# Patient Record
Sex: Female | Born: 1960 | Race: White | Hispanic: No | Smoking: Current some day smoker
Health system: Southern US, Community
[De-identification: ages and names within clinical notes are randomized; demographics above are authoritative.]

## PROBLEM LIST (undated history)

## (undated) DIAGNOSIS — D649 Anemia, unspecified: Secondary | ICD-10-CM

## (undated) DIAGNOSIS — IMO0002 Reserved for concepts with insufficient information to code with codable children: Secondary | ICD-10-CM

## (undated) DIAGNOSIS — K219 Gastro-esophageal reflux disease without esophagitis: Secondary | ICD-10-CM

## (undated) DIAGNOSIS — M1990 Unspecified osteoarthritis, unspecified site: Secondary | ICD-10-CM

## (undated) DIAGNOSIS — F419 Anxiety disorder, unspecified: Secondary | ICD-10-CM

## (undated) DIAGNOSIS — S2341XA Sprain of ribs, initial encounter: Secondary | ICD-10-CM

## (undated) DIAGNOSIS — M549 Dorsalgia, unspecified: Secondary | ICD-10-CM

## (undated) DIAGNOSIS — J45909 Unspecified asthma, uncomplicated: Secondary | ICD-10-CM

## (undated) DIAGNOSIS — E119 Type 2 diabetes mellitus without complications: Secondary | ICD-10-CM

## (undated) DIAGNOSIS — F32A Depression, unspecified: Secondary | ICD-10-CM

## (undated) DIAGNOSIS — R945 Abnormal results of liver function studies: Secondary | ICD-10-CM

## (undated) DIAGNOSIS — K8020 Calculus of gallbladder without cholecystitis without obstruction: Secondary | ICD-10-CM

## (undated) DIAGNOSIS — R7989 Other specified abnormal findings of blood chemistry: Secondary | ICD-10-CM

## (undated) DIAGNOSIS — F329 Major depressive disorder, single episode, unspecified: Secondary | ICD-10-CM

## (undated) DIAGNOSIS — J189 Pneumonia, unspecified organism: Secondary | ICD-10-CM

## (undated) HISTORY — DX: Reserved for concepts with insufficient information to code with codable children: IMO0002

## (undated) HISTORY — DX: Depression, unspecified: F32.A

## (undated) HISTORY — DX: Calculus of gallbladder without cholecystitis without obstruction: K80.20

## (undated) HISTORY — DX: Sprain of ribs, initial encounter: S23.41XA

## (undated) HISTORY — PX: TUBAL LIGATION: SHX77

## (undated) HISTORY — PX: BREAST EXCISIONAL BIOPSY: SUR124

## (undated) HISTORY — DX: Pneumonia, unspecified organism: J18.9

## (undated) HISTORY — DX: Dorsalgia, unspecified: M54.9

## (undated) HISTORY — DX: Major depressive disorder, single episode, unspecified: F32.9

## (undated) HISTORY — DX: Type 2 diabetes mellitus without complications: E11.9

## (undated) HISTORY — DX: Unspecified osteoarthritis, unspecified site: M19.90

## (undated) HISTORY — DX: Unspecified asthma, uncomplicated: J45.909

## (undated) HISTORY — DX: Anxiety disorder, unspecified: F41.9

---

## 2000-05-23 HISTORY — PX: BREAST CYST ASPIRATION: SHX578

## 2004-06-11 ENCOUNTER — Emergency Department: Payer: Self-pay | Admitting: Unknown Physician Specialty

## 2005-05-09 ENCOUNTER — Emergency Department: Payer: Self-pay | Admitting: General Practice

## 2007-04-24 ENCOUNTER — Emergency Department: Payer: Self-pay | Admitting: Emergency Medicine

## 2007-04-28 ENCOUNTER — Emergency Department: Payer: Self-pay | Admitting: Emergency Medicine

## 2008-06-05 ENCOUNTER — Emergency Department: Payer: Self-pay | Admitting: Emergency Medicine

## 2008-11-19 ENCOUNTER — Emergency Department: Payer: Self-pay | Admitting: Internal Medicine

## 2009-06-17 ENCOUNTER — Emergency Department: Payer: Self-pay | Admitting: Emergency Medicine

## 2010-12-23 ENCOUNTER — Emergency Department: Payer: Self-pay | Admitting: Emergency Medicine

## 2011-06-26 ENCOUNTER — Ambulatory Visit: Payer: Self-pay | Admitting: Specialist

## 2011-12-06 ENCOUNTER — Ambulatory Visit: Payer: Self-pay | Admitting: Family Medicine

## 2011-12-08 ENCOUNTER — Ambulatory Visit: Payer: Self-pay | Admitting: Pain Medicine

## 2011-12-19 ENCOUNTER — Ambulatory Visit: Payer: Self-pay | Admitting: Pain Medicine

## 2012-01-02 ENCOUNTER — Ambulatory Visit: Payer: Self-pay | Admitting: Pain Medicine

## 2012-01-19 ENCOUNTER — Ambulatory Visit: Payer: Self-pay | Admitting: Pain Medicine

## 2012-02-01 ENCOUNTER — Ambulatory Visit: Payer: Self-pay | Admitting: Pain Medicine

## 2012-02-28 ENCOUNTER — Ambulatory Visit: Payer: Self-pay | Admitting: Pain Medicine

## 2012-03-12 ENCOUNTER — Ambulatory Visit: Payer: Self-pay | Admitting: Pain Medicine

## 2012-04-10 ENCOUNTER — Ambulatory Visit: Payer: Self-pay | Admitting: Pain Medicine

## 2012-04-23 ENCOUNTER — Ambulatory Visit: Payer: Self-pay | Admitting: Pain Medicine

## 2012-05-14 ENCOUNTER — Ambulatory Visit: Payer: Self-pay | Admitting: Pain Medicine

## 2012-05-23 HISTORY — PX: BREAST CYST ASPIRATION: SHX578

## 2012-06-12 ENCOUNTER — Ambulatory Visit: Payer: Self-pay | Admitting: Pain Medicine

## 2012-06-25 ENCOUNTER — Ambulatory Visit: Payer: Self-pay | Admitting: Pain Medicine

## 2012-07-19 ENCOUNTER — Ambulatory Visit: Payer: Self-pay | Admitting: Pain Medicine

## 2012-07-30 ENCOUNTER — Ambulatory Visit: Payer: Self-pay | Admitting: Pain Medicine

## 2012-08-08 ENCOUNTER — Ambulatory Visit: Payer: Self-pay | Admitting: Pain Medicine

## 2012-08-20 ENCOUNTER — Ambulatory Visit: Payer: Self-pay | Admitting: Pain Medicine

## 2012-09-06 ENCOUNTER — Ambulatory Visit: Payer: Self-pay | Admitting: Pain Medicine

## 2012-09-18 ENCOUNTER — Encounter: Payer: Self-pay | Admitting: Primary Care

## 2012-09-20 ENCOUNTER — Encounter: Payer: Self-pay | Admitting: Primary Care

## 2012-10-08 ENCOUNTER — Ambulatory Visit: Payer: Self-pay | Admitting: Pain Medicine

## 2012-10-11 ENCOUNTER — Ambulatory Visit: Payer: Self-pay | Admitting: Primary Care

## 2012-10-21 ENCOUNTER — Encounter: Payer: Self-pay | Admitting: Primary Care

## 2012-10-22 ENCOUNTER — Ambulatory Visit: Payer: Self-pay | Admitting: Primary Care

## 2012-11-01 ENCOUNTER — Ambulatory Visit (INDEPENDENT_AMBULATORY_CARE_PROVIDER_SITE_OTHER): Payer: Medicaid Other | Admitting: General Surgery

## 2012-11-01 ENCOUNTER — Encounter: Payer: Self-pay | Admitting: General Surgery

## 2012-11-01 ENCOUNTER — Other Ambulatory Visit: Payer: Self-pay

## 2012-11-01 VITALS — BP 130/72 | HR 74 | Resp 12 | Ht 64.0 in | Wt 196.0 lb

## 2012-11-01 DIAGNOSIS — N63 Unspecified lump in unspecified breast: Secondary | ICD-10-CM

## 2012-11-01 DIAGNOSIS — N6009 Solitary cyst of unspecified breast: Secondary | ICD-10-CM

## 2012-11-01 DIAGNOSIS — N6002 Solitary cyst of left breast: Secondary | ICD-10-CM

## 2012-11-01 NOTE — Progress Notes (Signed)
Patient ID: Gina Costa, female   DOB: 1961-05-14, 52 y.o.   MRN: [id number]  Chief Complaint  Patient presents with  . Other    mammogram    HPI Gina Costa is a 52 y.o. female Who presents for a breast evaluation referred by Sandrea Hughs NP.Marland Kitchen The most recent mammogram was done on October 22 2012 cat 4 . Patient does perform regular self breast checks but not every month and this is the first mammogram ever done. No family history of colon cancer. Family history of breast cancer includes a maternal grandmother and first maternal cousin. Patient states she "can't feel anything".  Does state she has run into bedpost several times in past and hits left breast.  Denies breast pain. Followed by Dr. Metta Clines at the pain clinic for chronic back pain and is using a cane.Marland Kitchen  HPI  Past Medical History  Diagnosis Date  . Diabetes mellitus without complication   . Asthma   . Back pain   . Bulging disc   . Arthritis     back    History reviewed. No pertinent past surgical history.  Family History  Problem Relation Age of Onset  . Cancer Maternal Grandmother     breast  . Cancer Cousin     breast    Social History History  Substance Use Topics  . Smoking status: Current Every Day Smoker -- 1.00 packs/day  . Smokeless tobacco: Never Used  . Alcohol Use: No    Allergies  Allergen Reactions  . Penicillins Hives    Current Outpatient Prescriptions  Medication Sig Dispense Refill  . gabapentin (NEURONTIN) 300 MG capsule Take 300 mg by mouth 3 (three) times daily.      Marland Kitchen oxyCODONE-acetaminophen (PERCOCET/ROXICET) 5-325 MG per tablet Take 1 tablet by mouth every 6 (six) hours as needed for pain.       No current facility-administered medications for this visit.    Review of Systems Review of Systems  Constitutional: Negative.   Respiratory: Positive for shortness of breath.   Cardiovascular: Negative.     Blood pressure 130/72, pulse 74, resp. rate 12, height 5\' 4"  (1.626 m),  weight 196 lb (88.905 kg).  Physical Exam Physical Exam  Constitutional: She is oriented to person, place, and time. She appears well-developed and well-nourished.  Cardiovascular: Normal rate and regular rhythm.   Pulmonary/Chest: Effort normal and breath sounds normal. Right breast exhibits no inverted nipple, no mass, no nipple discharge, no skin change and no tenderness. Left breast exhibits skin change. Left breast exhibits no inverted nipple, no mass, no nipple discharge and no tenderness.  Lymphadenopathy:    She has no cervical adenopathy.    She has no axillary adenopathy.  Neurological: She is alert and oriented to person, place, and time.  Skin: Skin is warm and dry.  3 cm dermal cyst adjacent to laternal sternum. 12 CNF left breast old skin cyst   Data Reviewed Bilateral mammograms dated 10/11/2012 showed a well-circumscribed mass in the retroareolar area of the left breast for which additional views were requested. The right breast is unremarkable. BI-RAD-0.  Focal spot compression views of the left breast dated 10/22/2012 as well as ultrasound that date yielded a well-circumscribed 8 x 9 x 10 mm hypoechoic mass with increased transmission with echogenic foci, likely representing debris. BI-RAD-4.  Ultrasound examination of the left breast at the 12:00 position 2 cm in the nipple showed a 0.82 x 0.8 x 0.92 fairly smoothly marginated lesion with good  acoustic enhancement. This appeared to be in continuity with an adjacent duct leading to the nipple. No intraductal lesions were noted. The patient was amenable to aspiration and this was completed using 1 cc of 1% plain Xylocaine. Complete resolution with return of 1 cc of clear fluid was obtained.  Assessment    Left breast cyst, resolved by aspiration.    Plan    The patient should resume annual screening mammograms in 2015 with her primary care provider.       Earline Mayotte 11/02/2012, 9:27 AM

## 2012-11-01 NOTE — Patient Instructions (Addendum)
Continue self breast exams. Call office for any new breast issues or concerns. 

## 2012-11-02 ENCOUNTER — Encounter: Payer: Self-pay | Admitting: General Surgery

## 2012-11-02 DIAGNOSIS — N6009 Solitary cyst of unspecified breast: Secondary | ICD-10-CM | POA: Insufficient documentation

## 2012-11-05 ENCOUNTER — Ambulatory Visit: Payer: Self-pay | Admitting: Pain Medicine

## 2012-11-19 ENCOUNTER — Ambulatory Visit: Payer: Self-pay | Admitting: Pain Medicine

## 2012-12-04 ENCOUNTER — Ambulatory Visit: Payer: Self-pay | Admitting: Pain Medicine

## 2012-12-12 ENCOUNTER — Ambulatory Visit: Payer: Self-pay | Admitting: Pain Medicine

## 2013-01-03 ENCOUNTER — Ambulatory Visit: Payer: Self-pay | Admitting: Pain Medicine

## 2013-01-23 ENCOUNTER — Ambulatory Visit: Payer: Self-pay | Admitting: Pain Medicine

## 2013-03-05 ENCOUNTER — Ambulatory Visit: Payer: Self-pay | Admitting: Pain Medicine

## 2013-03-20 ENCOUNTER — Ambulatory Visit: Payer: Self-pay | Admitting: Pain Medicine

## 2013-04-02 ENCOUNTER — Ambulatory Visit: Payer: Self-pay | Admitting: Pain Medicine

## 2013-04-10 ENCOUNTER — Ambulatory Visit: Payer: Self-pay | Admitting: Pain Medicine

## 2013-05-09 ENCOUNTER — Ambulatory Visit: Payer: Self-pay | Admitting: Pain Medicine

## 2013-05-20 ENCOUNTER — Ambulatory Visit: Payer: Self-pay | Admitting: Pain Medicine

## 2013-06-18 ENCOUNTER — Ambulatory Visit: Payer: Self-pay | Admitting: Pain Medicine

## 2013-06-26 ENCOUNTER — Ambulatory Visit: Payer: Self-pay | Admitting: Pain Medicine

## 2013-07-24 ENCOUNTER — Ambulatory Visit: Payer: Self-pay | Admitting: Pain Medicine

## 2013-07-31 ENCOUNTER — Ambulatory Visit: Payer: Self-pay | Admitting: Pain Medicine

## 2013-08-21 ENCOUNTER — Ambulatory Visit: Payer: Self-pay | Admitting: Pain Medicine

## 2013-09-02 ENCOUNTER — Ambulatory Visit: Payer: Self-pay | Admitting: Pain Medicine

## 2013-09-19 ENCOUNTER — Ambulatory Visit: Payer: Self-pay | Admitting: Pain Medicine

## 2013-09-25 ENCOUNTER — Ambulatory Visit: Payer: Self-pay | Admitting: Pain Medicine

## 2013-10-22 ENCOUNTER — Ambulatory Visit: Payer: Self-pay | Admitting: Pain Medicine

## 2013-10-30 ENCOUNTER — Ambulatory Visit: Payer: Self-pay | Admitting: Pain Medicine

## 2013-11-06 ENCOUNTER — Ambulatory Visit: Payer: Self-pay | Admitting: Pain Medicine

## 2013-11-19 ENCOUNTER — Ambulatory Visit: Payer: Self-pay | Admitting: Pain Medicine

## 2013-12-18 ENCOUNTER — Ambulatory Visit: Payer: Self-pay | Admitting: Pain Medicine

## 2013-12-25 ENCOUNTER — Ambulatory Visit: Payer: Self-pay | Admitting: Pain Medicine

## 2014-01-06 DIAGNOSIS — E782 Mixed hyperlipidemia: Secondary | ICD-10-CM | POA: Insufficient documentation

## 2014-01-06 DIAGNOSIS — E119 Type 2 diabetes mellitus without complications: Secondary | ICD-10-CM | POA: Insufficient documentation

## 2014-01-06 DIAGNOSIS — Z720 Tobacco use: Secondary | ICD-10-CM | POA: Insufficient documentation

## 2014-01-16 ENCOUNTER — Ambulatory Visit: Payer: Self-pay | Admitting: Pain Medicine

## 2014-01-29 ENCOUNTER — Ambulatory Visit: Payer: Self-pay | Admitting: Pain Medicine

## 2014-02-03 ENCOUNTER — Emergency Department: Payer: Self-pay | Admitting: Emergency Medicine

## 2014-02-10 ENCOUNTER — Ambulatory Visit: Payer: Self-pay | Admitting: Internal Medicine

## 2014-02-18 ENCOUNTER — Ambulatory Visit: Payer: Self-pay | Admitting: Pain Medicine

## 2014-02-24 ENCOUNTER — Ambulatory Visit: Payer: Self-pay | Admitting: Pain Medicine

## 2014-03-05 ENCOUNTER — Ambulatory Visit: Payer: Self-pay | Admitting: Pain Medicine

## 2014-03-24 ENCOUNTER — Encounter: Payer: Self-pay | Admitting: General Surgery

## 2014-03-25 ENCOUNTER — Ambulatory Visit: Payer: Self-pay | Admitting: Pain Medicine

## 2014-04-07 ENCOUNTER — Ambulatory Visit: Payer: Self-pay | Admitting: Pain Medicine

## 2014-05-01 ENCOUNTER — Ambulatory Visit: Payer: Self-pay | Admitting: Primary Care

## 2014-05-06 ENCOUNTER — Ambulatory Visit: Payer: Self-pay | Admitting: Pain Medicine

## 2014-05-21 ENCOUNTER — Ambulatory Visit: Payer: Self-pay | Admitting: Pain Medicine

## 2014-06-19 ENCOUNTER — Ambulatory Visit: Payer: Self-pay | Admitting: Pain Medicine

## 2014-06-25 ENCOUNTER — Ambulatory Visit: Payer: Self-pay | Admitting: Pain Medicine

## 2014-07-02 DIAGNOSIS — I340 Nonrheumatic mitral (valve) insufficiency: Secondary | ICD-10-CM | POA: Insufficient documentation

## 2014-07-15 ENCOUNTER — Ambulatory Visit: Payer: Self-pay | Admitting: Pain Medicine

## 2014-08-14 ENCOUNTER — Ambulatory Visit: Payer: Self-pay | Admitting: Pain Medicine

## 2014-08-27 ENCOUNTER — Ambulatory Visit
Admit: 2014-08-27 | Disposition: A | Discharge status: Home / Self Care | Payer: Self-pay | Attending: Pain Medicine | Admitting: Pain Medicine

## 2014-09-15 ENCOUNTER — Ambulatory Visit
Admit: 2014-09-15 | Disposition: A | Discharge status: Home / Self Care | Payer: Self-pay | Attending: Pain Medicine | Admitting: Pain Medicine

## 2014-09-24 ENCOUNTER — Ambulatory Visit: Payer: Medicaid Other | Attending: Pain Medicine | Admitting: Pain Medicine

## 2014-09-24 ENCOUNTER — Encounter: Payer: Self-pay | Admitting: Pain Medicine

## 2014-09-24 VITALS — BP 108/57 | HR 85 | Temp 98.6°F | Resp 16 | Ht 64.0 in | Wt 181.0 lb

## 2014-09-24 DIAGNOSIS — M5416 Radiculopathy, lumbar region: Secondary | ICD-10-CM | POA: Diagnosis not present

## 2014-09-24 DIAGNOSIS — M48062 Spinal stenosis, lumbar region with neurogenic claudication: Secondary | ICD-10-CM

## 2014-09-24 DIAGNOSIS — M5137 Other intervertebral disc degeneration, lumbosacral region: Secondary | ICD-10-CM | POA: Diagnosis present

## 2014-09-24 DIAGNOSIS — M51379 Other intervertebral disc degeneration, lumbosacral region without mention of lumbar back pain or lower extremity pain: Secondary | ICD-10-CM | POA: Insufficient documentation

## 2014-09-24 DIAGNOSIS — M4806 Spinal stenosis, lumbar region: Secondary | ICD-10-CM | POA: Diagnosis not present

## 2014-09-24 DIAGNOSIS — M47816 Spondylosis without myelopathy or radiculopathy, lumbar region: Secondary | ICD-10-CM | POA: Insufficient documentation

## 2014-09-24 MED ORDER — ORPHENADRINE CITRATE 30 MG/ML IJ SOLN
INTRAMUSCULAR | Status: AC
  Administered 2014-09-24: 10:00:00
  Filled 2014-09-24: qty 2

## 2014-09-24 MED ORDER — BUPIVACAINE HCL (PF) 0.25 % IJ SOLN
INTRAMUSCULAR | Status: AC
  Administered 2014-09-24: 10:00:00
  Filled 2014-09-24: qty 30

## 2014-09-24 MED ORDER — FENTANYL CITRATE (PF) 100 MCG/2ML IJ SOLN
INTRAMUSCULAR | Status: AC
  Administered 2014-09-24: 10:00:00 via INTRAVENOUS
  Filled 2014-09-24: qty 2

## 2014-09-24 MED ORDER — DOXYCYCLINE HYCLATE 100 MG PO TABS
100.0000 mg | ORAL_TABLET | Freq: Two times a day (BID) | ORAL | Status: DC
Start: 2014-09-24 — End: 2014-11-10

## 2014-09-24 MED ORDER — TRIAMCINOLONE ACETONIDE 40 MG/ML IJ SUSP
INTRAMUSCULAR | Status: AC
  Administered 2014-09-24: 10:00:00
  Filled 2014-09-24: qty 1

## 2014-09-24 MED ORDER — MIDAZOLAM HCL 5 MG/5ML IJ SOLN
INTRAMUSCULAR | Status: AC
  Administered 2014-09-24: 5 mg via INTRAVENOUS
  Filled 2014-09-24: qty 5

## 2014-09-24 NOTE — Progress Notes (Signed)
   Subjective:    Patient ID: Gina Costa, female    DOB: 05-Oct-1960, 54 y.o.   MRN: [id number]  HPI    Review of Systems     Objective:   Physical Exam        Assessment & Plan:

## 2014-09-24 NOTE — Procedures (Addendum)
PROCEDURE PERFORMED: Lumbar facet (medial branch block)   NOTE: The patient is a 54 y.o.-year-old female who [facility] and treatment of pain involving the lumbar and lower extremity region. MRI/CT scan/x-rays have revealed the patient to be with evidence of  DDD, facet arthropathy, and stenosis. The risks, benefits, and expectations of the procedure have been discussed and explained to the patient who was understanding and in agreement with suggested treatment plan. We will proceed with interventional treatment as discussed and as explained to the patient who was understanding and wished to proceed with procedure as planned.   DESCRIPTION OF PROCEDURE: Lumbar facet (medial branch block) with IV Versed, IV fentanyl conscious sedation, EKG, blood pressure, pulse, and pulse oximetry monitoring. The procedure was performed with the patient in the prone position. Betadine prep of proposed entry site performed.   PROCEDURE #1: left L 2 lumbar facet (medial branch block). Under fluoroscopic guidance with oblique orientation of 15 degrees, a 25-gauge needle was inserted at the L 2 vertebral body level with needle placed at the targeted area of Burton's Eye or Eye of the Scot[place] Dog with documentation of needle placement in the superior and lateral border of targeted area of Burton's Eye or Eye of the Scotty Dog with oblique orientation of 15 degrees. Following documentation of needle placement at the L 2 vertebral body level, needle placement was then accomplished at the L 3 vertebral body level.  Repeat procedure at L3, L4, L5, on the left.  PROCEDURE #2: Needle placement at the sacral ala with AP view of the lumbosacral spine. With the patient in the prone position, Betadine prep of proposed entry site accomplished, a 25-gauge needle was inserted in the region of the sacral ala (groove formed by the superior articulating process of S1 and the sacral wing). Following  documentation of needle placement at the sacral ala, needle placement was then accomplished at the S1 foramen level. Needle placement at the S1 foramen under fluoroscopic guidance with AP view of the lumbosacral spine and cephalad orientation of the fluoroscope, a 25-gauge needle was placed at the superior and lateral border of the S1 foramen under fluoroscopic guidance. Following documentation of needle placement at the S1 foramen.   Needle placement was then verified at all levels on lateral view. Following documentation of needle placement at all levels on lateral view and following negative aspiration for heme and CSF, each level was injected with 1 mL of 0.25% bupivacaine with Kenalog. The patient tolerated the procedure well. A total of 40 mg of Kenalog was utilized for the procedure.   PLAN:  1. Medications: The patient will continue presently prescribed medications. 2. May consider modification of treatment regimen at time of return appointment pending response to treatment rendered on today's visit. 3. The patient is to follow-up with primary care physician, Dr. Vergie Living  for further evaluation of blood pressure and general medical condition status post steroid injection performed on today's visit. 4. Surgical follow-up evaluation. 5. Neurological follow-up evaluation. 6. The patient may be candidate for radiofrequency procedures, implantation type procedures, and other treatment pending response to treatment and follow-up evaluation. 7. The patient has been advised to call the [facility] prior to scheduled return appointment should there be significant change in condition or should patient have other concerns regarding condition prior to scheduled return appointment.  The patient is understanding and in agreement with suggested treatment plan.

## 2014-09-24 NOTE — Progress Notes (Signed)
   Subjective:    Patient ID: Gina Costa, female    DOB: December 02, 1960, 54 y.o.   MRN: [id number]  HPI    Review of Systems     Objective:   Physical Exam        Assessment & Plan:

## 2014-09-24 NOTE — Patient Instructions (Addendum)
Patient is to follow-up with Dr. Posey Pronto and Elsworth Soho for evaluation of blood pressure and general medical condition status post procedure performed with steroid We'll continue Neurontin and oxycodone Follow-up evaluation Dr. Trenton Gammon as discussed Neurological evaluation with PNCV's and EMGs to be considered Patient is to call pain management should they be change in condition prior to scheduled return appointment and to adhere to proper body mechanics  Facet Joint Block, Care After Refer to this sheet in the next few weeks. These instructions provide you with information on caring for yourself after your procedure. Your health care provider may also give you more specific instructions. Your treatment has been planned according to current medical practices, but problems sometimes occur. Call your health care provider if you have any problems or questions after your procedure. HOME CARE INSTRUCTIONS   Keep track of the amount of pain relief you feel and how long it lasts.  Limit pain medicine within the first 4-6 hours after the procedure as directed by your health care provider.  Resume taking dietary supplements and medicines as directed by your health care provider.  You may resume your regular diet.  Do not apply heat near or over the injection site(s) for 24 hours.   Do not take a bath or soak in water (such as a pool or lake) for 24 hours.  Do not drive for 24 hours unless approved by your health care provider.  Avoid strenuous activity for 24 hours.  Remove your bandages the morning after the procedure.   If the injection site is tender, applying an ice pack may relieve some tenderness. To do this:  Put ice in a bag.  Place a towel between your skin and the bag.  Leave the ice on for 15-20 minutes, 3-4 times a day.  Keep follow-up appointments as directed by your health care provider. SEEK MEDICAL CARE IF:   Your pain is not controlled by your medicines.   There is drainage  from the injection site.   There is significant bleeding or swelling at the injection site.  You have diabetes and your blood sugar is above 180 mg/dL. SEEK IMMEDIATE MEDICAL CARE IF:   You develop a fever of 101F (38.3C) or greater.   You have worsening pain or swelling around the injection site.   You have red streaking around the injection site.   You develop severe pain that is not controlled by your medicines.   You develop a headache, stiff neck, nausea, or vomiting.   Your eyes become very sensitive to light.   You have weakness, paralysis, or tingling in your arms or legs that was not present before the procedure.   You develop difficulty urinating or breathing.  Document Released: 04/25/2012 Document Revised: 09/23/2013 Document Reviewed: 04/25/2012 [facility] Patient Information 2015 [place], [place]. This information is not intended to replace advice given to you by your health care provider. Make sure you discuss any questions you have with your health care provider.

## 2014-09-24 NOTE — Progress Notes (Signed)
Discharged to home via w/c at 11:10 am. Teachback x3.

## 2014-09-25 ENCOUNTER — Telehealth: Payer: Self-pay | Admitting: *Deleted

## 2014-09-25 NOTE — Telephone Encounter (Signed)
Patient states she is doing well and not having ant problems. Instructed to call for any problems or concerns.

## 2014-10-02 NOTE — Addendum Note (Signed)
Addended by: Dewayne Shorter on: 10/02/2014 10:57 AM   Modules accepted: Orders

## 2014-10-15 ENCOUNTER — Other Ambulatory Visit: Payer: Self-pay | Admitting: Pain Medicine

## 2014-10-16 ENCOUNTER — Encounter: Payer: Self-pay | Admitting: Pain Medicine

## 2014-10-16 ENCOUNTER — Ambulatory Visit: Payer: Medicaid Other | Attending: Pain Medicine | Admitting: Pain Medicine

## 2014-10-16 VITALS — BP 102/65 | HR 87 | Temp 98.9°F | Resp 16 | Ht 64.0 in | Wt 181.0 lb

## 2014-10-16 DIAGNOSIS — G588 Other specified mononeuropathies: Secondary | ICD-10-CM | POA: Insufficient documentation

## 2014-10-16 DIAGNOSIS — M533 Sacrococcygeal disorders, not elsewhere classified: Secondary | ICD-10-CM | POA: Diagnosis not present

## 2014-10-16 DIAGNOSIS — M5136 Other intervertebral disc degeneration, lumbar region: Secondary | ICD-10-CM | POA: Diagnosis not present

## 2014-10-16 DIAGNOSIS — M79605 Pain in left leg: Secondary | ICD-10-CM | POA: Diagnosis present

## 2014-10-16 DIAGNOSIS — M79604 Pain in right leg: Secondary | ICD-10-CM | POA: Diagnosis present

## 2014-10-16 DIAGNOSIS — M5137 Other intervertebral disc degeneration, lumbosacral region: Secondary | ICD-10-CM

## 2014-10-16 DIAGNOSIS — M1288 Other specific arthropathies, not elsewhere classified, other specified site: Secondary | ICD-10-CM | POA: Diagnosis not present

## 2014-10-16 DIAGNOSIS — M5416 Radiculopathy, lumbar region: Secondary | ICD-10-CM

## 2014-10-16 DIAGNOSIS — M47816 Spondylosis without myelopathy or radiculopathy, lumbar region: Secondary | ICD-10-CM

## 2014-10-16 DIAGNOSIS — M545 Low back pain: Secondary | ICD-10-CM | POA: Diagnosis present

## 2014-10-16 MED ORDER — OXYCODONE HCL 5 MG PO TABS: ORAL_TABLET | ORAL | Status: DC

## 2014-10-16 MED ORDER — GABAPENTIN 300 MG PO CAPS: ORAL_CAPSULE | ORAL | Status: DC

## 2014-10-16 NOTE — Patient Instructions (Addendum)
Continue present medications.  Lumbar epidural steroid injection 10/29/2014  F/U PCP for evaliation of  BP and general medical  condition.  F/U surgical evaluation.  F/U neurological evaluation.  May consider radiofrequency rhizolysis or intraspinal procedures pending response to present treatment and F/U evaluation.  Patient to call [facility] should patient have concerns prior to scheduled return appointment. Epidural Steroid Injection Patient Information  Description: The epidural space surrounds the nerves as they exit the spinal cord.  In some patients, the nerves can be compressed and inflamed by a bulging disc or a tight spinal canal (spinal stenosis).  By injecting steroids into the epidural space, we can bring irritated nerves into direct contact with a potentially helpful medication.  These steroids act directly on the irritated nerves and can reduce swelling and inflammation which often leads to decreased pain.  Epidural steroids may be injected anywhere along the spine and from the neck to the low back depending upon the location of your pain.   After numbing the skin with local anesthetic (like Novocaine), a small needle is passed into the epidural space slowly.  You may experience a sensation of pressure while this is being done.  The entire block usually last less than 10 minutes.  Conditions which may be treated by epidural steroids:   Low back and leg pain  Neck and arm pain  Spinal stenosis  Post-laminectomy syndrome  Herpes zoster (shingles) pain  Pain from compression fractures  Preparation for the injection:  1. Do not eat any solid food or dairy products within 6 hours of your appointment.  2. You may drink clear liquids up to 2 hours before appointment.  Clear liquids include water, black coffee, juice or soda.  No milk or cream please. 3. You may take your regular medication, including pain medications, with a sip of water before your  appointment  Diabetics should hold regular insulin (if taken separately) and take 1/2 normal NPH dos the morning of the procedure.  Carry some sugar containing items with you to your appointment. 4. A driver must accompany you and be prepared to drive you home after your procedure.  5. Bring all your current medications with your. 6. An IV may be inserted and sedation may be given at the discretion of the physician.   7. A blood pressure cuff, EKG and other monitors will often be applied during the procedure.  Some patients may need to have extra oxygen administered for a short period. 8. You will be asked to provide medical information, including your allergies, prior to the procedure.  We must know immediately if you are taking blood thinners (like Coumadin/Warfarin)  Or if you are allergic to IV iodine contrast (dye). We must know if you could possible be pregnant.  Possible side-effects:  Bleeding from needle site  Infection (rare, may require surgery)  Nerve injury (rare)  Numbness & tingling (temporary)  Difficulty urinating (rare, temporary)  Spinal headache ( a headache worse with upright posture)  Light -headedness (temporary)  Pain at injection site (several days)  Decreased blood pressure (temporary)  Weakness in arm/leg (temporary)  Pressure sensation in back/neck (temporary)  Call if you experience:  Fever/chills associated with headache or increased back/neck pain.  Headache worsened by an upright position.  New onset weakness or numbness of an extremity below the injection site  Hives or difficulty breathing (go to the emergency room)  Inflammation or drainage at the infection site  Severe back/neck pain  Any new symptoms  which are concerning to you  Please note:  Although the local anesthetic injected can often make your back or neck feel good for several hours after the injection, the pain will likely return.  It takes 3-7 days for steroids to work in  the epidural space.  You may not notice any pain relief for at least that one week.  If effective, we will often do a series of three injections spaced 3-6 weeks apart to maximally decrease your pain.  After the initial series, we generally will wait several months before considering a repeat injection of the same type.  If you have any questions, please call [phone] [place]  What are the risk, side effects and possible complications? Generally speaking, most procedures are safe.  However, with any procedure there are risks, side effects, and the possibility of complications.  The risks and complications are dependent upon the sites that are lesioned, or the type of nerve block to be performed.  The closer the procedure is to the spine, the more serious the risks are.  Great care is taken when placing the radio frequency needles, block needles or lesioning probes, but sometimes complications can occur. 1. Infection: Any time there is an injection through the skin, there is a risk of infection.  This is why sterile conditions are used for these blocks.  There are four possible types of infection. 1. Localized skin infection. 2. Central Nervous System Infection-This can be in the form of Meningitis, which can be deadly. 3. Epidural Infections-This can be in the form of an epidural abscess, which can cause pressure inside of the spine, causing compression of the spinal cord with subsequent paralysis. This would require an emergency surgery to decompress, and there are no guarantees that the patient would recover from the paralysis. 4. Discitis-This is an infection of the intervertebral discs.  It occurs in about 1% of discography procedures.  It is difficult to treat and it may lead to surgery.        2. Pain: the needles have to go through skin and soft tissues, will cause soreness.       3. Damage to internal structures:  The  nerves to be lesioned may be near blood vessels or    other nerves which can be potentially damaged.       4. Bleeding: Bleeding is more common if the patient is taking blood thinners such as  aspirin, Coumadin, Ticiid, Plavix, etc., or if he/she have some genetic predisposition  such as hemophilia. Bleeding into the spinal canal can cause compression of the spinal  cord with subsequent paralysis.  This would require an emergency surgery to  decompress and there are no guarantees that the patient would recover from the  paralysis.       5. Pneumothorax:  Puncturing of a lung is a possibility, every time a needle is introduced in  the area of the chest or upper back.  Pneumothorax refers to free air around the  collapsed lung(s), inside of the thoracic cavity (chest cavity).  Another two possible  complications related to a similar event would include: Hemothorax and Chylothorax.   These are variations of the Pneumothorax, where instead of air around the collapsed  lung(s), you may have blood or chyle, respectively.       6. Spinal headaches: They may occur with any procedures in the area of the spine.       7. Persistent  CSF (Cerebro-Spinal Fluid) leakage: This is a rare problem, but may occur  with prolonged intrathecal or epidural catheters either due to the formation of a fistulous  track or a dural tear.       8. Nerve damage: By working so close to the spinal cord, there is always a possibility of  nerve damage, which could be as serious as a permanent spinal cord injury with  paralysis.       9. Death:  Although rare, severe deadly allergic reactions known as "Anaphylactic  reaction" can occur to any of the medications used.      10. Worsening of the symptoms:  We can always make thing worse.  What are the chances of something like this happening? Chances of any of this occuring are extremely low.  By statistics, you have more of a chance of getting killed in a motor vehicle accident: while driving  to the hospital than any of the above occurring .  Nevertheless, you should be aware that they are possibilities.  In general, it is similar to taking a shower.  Everybody knows that you can slip, hit your head and get killed.  Does that mean that you should not shower again?  Nevertheless always keep in mind that statistics do not mean anything if you happen to be on the wrong side of them.  Even if a procedure has a 1 (one) in a 1,000,000 (million) chance of going wrong, it you happen to be that one..Also, keep in mind that by statistics, you have more of a chance of having something go wrong when taking medications.  Who should not have this procedure? If you are on a blood thinning medication (e.g. Coumadin, Plavix, see list of "Blood Thinners"), or if you have an active infection going on, you should not have the procedure.  If you are taking any blood thinners, please inform your physician.  How should I prepare for this procedure?  Do not eat or drink anything at least six hours prior to the procedure.  Bring a driver with you .  It cannot be a taxi.  Come accompanied by an adult that can drive you back, and that is strong enough to help you if your legs get weak or numb from the local anesthetic.  Take all of your medicines the morning of the procedure with just enough water to swallow them.  If you have diabetes, make sure that you are scheduled to have your procedure done first thing in the morning, whenever possible.  If you have diabetes, take only half of your insulin dose and notify our nurse that you have done so as soon as you arrive at the clinic.  If you are diabetic, but only take blood sugar pills (oral hypoglycemic), then do not take them on the morning of your procedure.  You may take them after you have had the procedure.  Do not take aspirin or any aspirin-containing medications, at least eleven (11) days prior to the procedure.  They may prolong bleeding.  Wear loose  fitting clothing that may be easy to take off and that you would not mind if it got stained with Betadine or blood.  Do not wear any jewelry or perfume  Remove any nail coloring.  It will interfere with some of our monitoring equipment.  NOTE: Remember that this is not meant to be interpreted as a complete list of all possible complications.  Unforeseen problems may occur.  BLOOD THINNERS The following  drugs contain aspirin or other products, which can cause increased bleeding during surgery and should not be taken for 2 weeks prior to and 1 week after surgery.  If you should need take something for relief of minor pain, you may take acetaminophen which is found in Tylenol,m Datril, Anacin-3 and Panadol. It is not blood thinner. The products listed below are.  Do not take any of the products listed below in addition to any listed on your instruction sheet.  A.P.C or A.P.C with Codeine Codeine Phosphate Capsules #3 Ibuprofen Ridaura  ABC compound Congesprin Imuran rimadil  Advil Cope Indocin Robaxisal  Alka-Seltzer Effervescent Pain Reliever and Antacid Coricidin or Coricidin-D  Indomethacin Rufen  Alka-Seltzer plus Cold Medicine Cosprin Ketoprofen S-A-C Tablets  Anacin Analgesic Tablets or Capsules Coumadin Korlgesic Salflex  Anacin Extra Strength Analgesic tablets or capsules CP-2 Tablets Lanoril Salicylate  Anaprox Cuprimine Capsules Levenox Salocol  Anexsia-D Dalteparin Magan Salsalate  Anodynos Darvon compound Magnesium Salicylate Sine-off  Ansaid Dasin Capsules Magsal Sodium Salicylate  Anturane Depen Capsules Marnal Soma  APF Arthritis pain formula Dewitt's Pills Measurin Stanback  Argesic Dia-Gesic Meclofenamic Sulfinpyrazone  Arthritis Bayer Timed Release Aspirin Diclofenac Meclomen Sulindac  Arthritis pain formula Anacin Dicumarol Medipren Supac  Analgesic (Safety coated) Arthralgen Diffunasal Mefanamic Suprofen  Arthritis Strength Bufferin Dihydrocodeine Mepro Compound Suprol   Arthropan liquid Dopirydamole Methcarbomol with Aspirin Synalgos  ASA tablets/Enseals Disalcid Micrainin Tagament  Ascriptin Doan's Midol Talwin  Ascriptin A/D Dolene Mobidin Tanderil  Ascriptin Extra Strength Dolobid Moblgesic Ticlid  Ascriptin with Codeine Doloprin or Doloprin with Codeine Momentum Tolectin  Asperbuf Duoprin Mono-gesic Trendar  Aspergum Duradyne Motrin or Motrin IB Triminicin  Aspirin plain, buffered or enteric coated Durasal Myochrisine Trigesic  Aspirin Suppositories Easprin Nalfon Trillsate  Aspirin with Codeine Ecotrin Regular or Extra Strength Naprosyn Uracel  Atromid-S Efficin Naproxen Ursinus  Auranofin Capsules Elmiron Neocylate Vanquish  Axotal Emagrin Norgesic Verin  Azathioprine Empirin or Empirin with Codeine Normiflo Vitamin E  Azolid Emprazil Nuprin Voltaren  Bayer Aspirin plain, buffered or children's or timed BC Tablets or powders Encaprin Orgaran Warfarin Sodium  Buff-a-Comp Enoxaparin Orudis Zorpin  Buff-a-Comp with Codeine Equegesic Os-Cal-Gesic   Buffaprin Excedrin plain, buffered or Extra Strength Oxalid   Bufferin Arthritis Strength Feldene Oxphenbutazone   Bufferin plain or Extra Strength Feldene Capsules Oxycodone with Aspirin   Bufferin with Codeine Fenoprofen Fenoprofen Pabalate or Pabalate-SF   Buffets II Flogesic Panagesic   Buffinol plain or Extra Strength Florinal or Florinal with Codeine Panwarfarin   Buf-Tabs Flurbiprofen Penicillamine   Butalbital Compound Four-way cold tablets Penicillin   Butazolidin Fragmin Pepto-Bismol   Carbenicillin Geminisyn Percodan   Carna Arthritis Reliever Geopen Persantine   Carprofen Gold's salt Persistin   Chloramphenicol Goody's Phenylbutazone   Chloromycetin Haltrain Piroxlcam   Clmetidine heparin Plaquenil   Cllnoril Hyco-pap Ponstel   Clofibrate Hydroxy chloroquine Propoxyphen         Before stopping any of these medications, be sure to consult the physician who ordered them.  Some, such as  Coumadin (Warfarin) are ordered to prevent or treat serious conditions such as "deep thrombosis", "pumonary embolisms", and other heart problems.  The amount of time that you may need off of the medication may also vary with the medication and the reason for which you were taking it.  If you are taking any of these medications, please make sure you notify your pain physician before you undergo any procedures.

## 2014-10-16 NOTE — Progress Notes (Signed)
   Subjective:    Patient ID: Gina Costa, female    DOB: 07-Nov-1960, 54 y.o.   MRN: [id number]  HPI  Patient is 54 year old female returns to [facility] for further evaluation and treatment of pain involving the lower back and lower extremity regions. Patient states that she had improvement following previous procedure consisting of lumbar epidural steroid injection. We will schedule patient for neurosurgical evaluation and will proceed with interventional treatment at time return appointment as discussed with patient on today's visit. Patient tolerating medications well without undesirable side effects. Patient denies trauma change in events of daily living the call significant change in symptomatology.   Review of Systems     Objective:   Physical Exam  There was tenderness of the splenius capitis and occipitalis musculature regions of moderate degree with palpation of the cervical facet and cervical paraspinal musculature region reproducing pain of moderate discomfort. Palpation of the acromioclavicular and glenohumeral joint regions reproduced mild discomfort and patient appeared to be with slightly decreased grip strength. There was mild to moderate tends to palpation of the thoracic region without crepitus of the thoracic region with evidence of significant muscle spasms occurring in the lower thoracic paraspinal musculature region. Palpation of the lumbar facet lumbar paraspinal musculature region reproduced pain of moderate moderately severe discomfort with lateral bending and rotation reproducing moderate to moderately severe discomfort. Outpatient over the PSIS and PII S regions reproduced pain of moderate degree. Straight leg raising was tolerated to 20 without increased pain dorsiflexion noted. Patient with difficulty intense and to stand on tiptoes and heels. There was negative clonus and negative Homans. Abdomen nontender with no costovertebral angle tenderness  noted      Assessment & Plan:    Lumbar degenerative disc disease Multilevel facet arthropathy asymmetric edema about the right side facet joint L5-S1 level most pronounced with some progression since prior exam with disc bulging without definite focal protrusion or stenosis, multilevel facet arthropathy L2-3 L3-4 L4-5 and L5-S1 levels.  Lumbar radiculopathy  Lumbar facet syndrome  Sacroiliac joint dysfunction  Intercostal neuralgia History of fracture of ribs    Plan  Continue present medications  Lumbar epidural steroid injection to be performed at time return appointment  F/U PCP for evaliation of  BP and general medical  condition.  F/U surgical evaluation.  F/U neurological evaluation.  May consider radiofrequency rhizolysis or intraspinal procedures pending response to present treatment and F/U evaluation.  Patient to call [facility] should patient have concerns prior to scheduled return appointment.

## 2014-10-16 NOTE — Progress Notes (Signed)
Discharged to home, ambulatory with script for oxycodone.  Pre procedure instructions given with teach back 3 done.

## 2014-10-26 ENCOUNTER — Other Ambulatory Visit: Payer: Self-pay | Admitting: Pain Medicine

## 2014-10-29 ENCOUNTER — Ambulatory Visit: Payer: Medicaid Other | Admitting: Pain Medicine

## 2014-11-10 ENCOUNTER — Encounter: Payer: Self-pay | Admitting: Pain Medicine

## 2014-11-10 ENCOUNTER — Ambulatory Visit: Payer: Medicaid Other | Attending: Pain Medicine | Admitting: Pain Medicine

## 2014-11-10 VITALS — BP 103/70 | HR 80 | Temp 98.2°F | Resp 17 | Ht 60.0 in | Wt 180.0 lb

## 2014-11-10 DIAGNOSIS — M79605 Pain in left leg: Secondary | ICD-10-CM | POA: Diagnosis present

## 2014-11-10 DIAGNOSIS — M47816 Spondylosis without myelopathy or radiculopathy, lumbar region: Secondary | ICD-10-CM | POA: Diagnosis not present

## 2014-11-10 DIAGNOSIS — G588 Other specified mononeuropathies: Secondary | ICD-10-CM

## 2014-11-10 DIAGNOSIS — M533 Sacrococcygeal disorders, not elsewhere classified: Secondary | ICD-10-CM

## 2014-11-10 DIAGNOSIS — M5416 Radiculopathy, lumbar region: Secondary | ICD-10-CM

## 2014-11-10 DIAGNOSIS — M1288 Other specific arthropathies, not elsewhere classified, other specified site: Secondary | ICD-10-CM | POA: Diagnosis not present

## 2014-11-10 DIAGNOSIS — M5137 Other intervertebral disc degeneration, lumbosacral region: Secondary | ICD-10-CM

## 2014-11-10 DIAGNOSIS — M48062 Spinal stenosis, lumbar region with neurogenic claudication: Secondary | ICD-10-CM

## 2014-11-10 DIAGNOSIS — M545 Low back pain: Secondary | ICD-10-CM | POA: Diagnosis present

## 2014-11-10 DIAGNOSIS — M79604 Pain in right leg: Secondary | ICD-10-CM | POA: Diagnosis present

## 2014-11-10 MED ORDER — LIDOCAINE HCL (PF) 1 % IJ SOLN
INTRAMUSCULAR | Status: AC
  Administered 2014-11-10: 5 mL
  Filled 2014-11-10: qty 5

## 2014-11-10 MED ORDER — SODIUM CHLORIDE 0.9 % IJ SOLN
INTRAMUSCULAR | Status: AC
  Administered 2014-11-10: 4 mL
  Filled 2014-11-10: qty 20

## 2014-11-10 MED ORDER — ORPHENADRINE CITRATE 30 MG/ML IJ SOLN
INTRAMUSCULAR | Status: AC
  Filled 2014-11-10: qty 2

## 2014-11-10 MED ORDER — TRIAMCINOLONE ACETONIDE 40 MG/ML IJ SUSP
INTRAMUSCULAR | Status: AC
  Administered 2014-11-10: 40 mg
  Filled 2014-11-10: qty 1

## 2014-11-10 MED ORDER — BUPIVACAINE HCL (PF) 0.25 % IJ SOLN
INTRAMUSCULAR | Status: AC
  Filled 2014-11-10: qty 30

## 2014-11-10 NOTE — Patient Instructions (Addendum)
Continue present medications.  F/U PCP for evaliation of  BP and general medical  condition.  F/U surgical evaluation.. We will call her primary care physician again today to request that you be scheduled for neurosurgical reevaluation of the lower back and lower extremity pain and weakness   F/U neurological evaluation.  May consider radiofrequency rhizolysis or intraspinal procedures pending response to present treatment and F/U evaluation.  Patient to call [facility] Management Center should patient have concerns prior to scheduled return appointment. GENERAL RISKS AND COMPLICATIONS  What are the risk, side effects and possible complications? Generally speaking, most procedures are safe.  However, with any procedure there are risks, side effects, and the possibility of complications.  The risks and complications are dependent upon the sites that are lesioned, or the type of nerve block to be performed.  The closer the procedure is to the spine, the more serious the risks are.  Great care is taken when placing the radio frequency needles, block needles or lesioning probes, but sometimes complications can occur. 1. Infection: Any time there is an injection through the skin, there is a risk of infection.  This is why sterile conditions are used for these blocks.  There are four possible types of infection. 1. Localized skin infection. 2. Central Nervous System Infection-This can be in the form of Meningitis, which can be deadly. 3. Epidural Infections-This can be in the form of an epidural abscess, which can cause pressure inside of the spine, causing compression of the spinal cord with subsequent paralysis. This would require an emergency surgery to decompress, and there are no guarantees that the patient would recover from the paralysis. 4. Discitis-This is an infection of the intervertebral discs.  It occurs in about 1% of discography procedures.  It is difficult to treat and it may lead to  surgery.        2. Pain: the needles have to go through skin and soft tissues, will cause soreness.       3. Damage to internal structures:  The nerves to be lesioned may be near blood vessels or    other nerves which can be potentially damaged.       4. Bleeding: Bleeding is more common if the patient is taking blood thinners such as  aspirin, Coumadin, Ticiid, Plavix, etc., or if he/she have some genetic predisposition  such as hemophilia. Bleeding into the spinal canal can cause compression of the spinal  cord with subsequent paralysis.  This would require an emergency surgery to  decompress and there are no guarantees that the patient would recover from the  paralysis.       5. Pneumothorax:  Puncturing of a lung is a possibility, every time a needle is introduced in  the area of the chest or upper back.  Pneumothorax refers to free air around the  collapsed lung(s), inside of the thoracic cavity (chest cavity).  Another two possible  complications related to a similar event would include: Hemothorax and Chylothorax.   These are variations of the Pneumothorax, where instead of air around the collapsed  lung(s), you may have blood or chyle, respectively.       6. Spinal headaches: They may occur with any procedures in the area of the spine.       7. Persistent CSF (Cerebro-Spinal Fluid) leakage: This is a rare problem, but may occur  with prolonged intrathecal or epidural catheters either due to the formation of a fistulous  track or a dural tear.  8. Nerve damage: By working so close to the spinal cord, there is always a possibility of  nerve damage, which could be as serious as a permanent spinal cord injury with  paralysis.       9. Death:  Although rare, severe deadly allergic reactions known as "Anaphylactic  reaction" can occur to any of the medications used.      10. Worsening of the symptoms:  We can always make thing worse.  What are the chances of something like this  happening? Chances of any of this occuring are extremely low.  By statistics, you have more of a chance of getting killed in a motor vehicle accident: while driving to the hospital than any of the above occurring .  Nevertheless, you should be aware that they are possibilities.  In general, it is similar to taking a shower.  Everybody knows that you can slip, hit your head and get killed.  Does that mean that you should not shower again?  Nevertheless always keep in mind that statistics do not mean anything if you happen to be on the wrong side of them.  Even if a procedure has a 1 (one) in a 1,000,000 (million) chance of going wrong, it you happen to be that one..Also, keep in mind that by statistics, you have more of a chance of having something go wrong when taking medications.  Who should not have this procedure? If you are on a blood thinning medication (e.g. Coumadin, Plavix, see list of "Blood Thinners"), or if you have an active infection going on, you should not have the procedure.  If you are taking any blood thinners, please inform your physician.  How should I prepare for this procedure?  Do not eat or drink anything at least six hours prior to the procedure.  Bring a driver with you .  It cannot be a taxi.  Come accompanied by an adult that can drive you back, and that is strong enough to help you if your legs get weak or numb from the local anesthetic.  Take all of your medicines the morning of the procedure with just enough water to swallow them.  If you have diabetes, make sure that you are scheduled to have your procedure done first thing in the morning, whenever possible.  If you have diabetes, take only half of your insulin dose and notify our nurse that you have done so as soon as you arrive at the clinic.  If you are diabetic, but only take blood sugar pills (oral hypoglycemic), then do not take them on the morning of your procedure.  You may take them after you have had the  procedure.  Do not take aspirin or any aspirin-containing medications, at least eleven (11) days prior to the procedure.  They may prolong bleeding.  Wear loose fitting clothing that may be easy to take off and that you would not mind if it got stained with Betadine or blood.  Do not wear any jewelry or perfume  Remove any nail coloring.  It will interfere with some of our monitoring equipment.  NOTE: Remember that this is not meant to be interpreted as a complete list of all possible complications.  Unforeseen problems may occur.  BLOOD THINNERS The following drugs contain aspirin or other products, which can cause increased bleeding during surgery and should not be taken for 2 weeks prior to and 1 week after surgery.  If you should need take something for relief of minor  pain, you may take acetaminophen which is found in Tylenol,m Datril, Anacin-3 and Panadol. It is not blood thinner. The products listed below are.  Do not take any of the products listed below in addition to any listed on your instruction sheet.  A.P.C or A.P.C with Codeine Codeine Phosphate Capsules #3 Ibuprofen Ridaura  ABC compound Congesprin Imuran rimadil  Advil Cope Indocin Robaxisal  Alka-Seltzer Effervescent Pain Reliever and Antacid Coricidin or Coricidin-D  Indomethacin Rufen  Alka-Seltzer plus Cold Medicine Cosprin Ketoprofen S-A-C Tablets  Anacin Analgesic Tablets or Capsules Coumadin Korlgesic Salflex  Anacin Extra Strength Analgesic tablets or capsules CP-2 Tablets Lanoril Salicylate  Anaprox Cuprimine Capsules Levenox Salocol  Anexsia-D Dalteparin Magan Salsalate  Anodynos Darvon compound Magnesium Salicylate Sine-off  Ansaid Dasin Capsules Magsal Sodium Salicylate  Anturane Depen Capsules Marnal Soma  APF Arthritis pain formula Dewitt's Pills Measurin Stanback  Argesic Dia-Gesic Meclofenamic Sulfinpyrazone  Arthritis Bayer Timed Release Aspirin Diclofenac Meclomen Sulindac  Arthritis pain formula  Anacin Dicumarol Medipren Supac  Analgesic (Safety coated) Arthralgen Diffunasal Mefanamic Suprofen  Arthritis Strength Bufferin Dihydrocodeine Mepro Compound Suprol  Arthropan liquid Dopirydamole Methcarbomol with Aspirin Synalgos  ASA tablets/Enseals Disalcid Micrainin Tagament  Ascriptin Doan's Midol Talwin  Ascriptin A/D Dolene Mobidin Tanderil  Ascriptin Extra Strength Dolobid Moblgesic Ticlid  Ascriptin with Codeine Doloprin or Doloprin with Codeine Momentum Tolectin  Asperbuf Duoprin Mono-gesic Trendar  Aspergum Duradyne Motrin or Motrin IB Triminicin  Aspirin plain, buffered or enteric coated Durasal Myochrisine Trigesic  Aspirin Suppositories Easprin Nalfon Trillsate  Aspirin with Codeine Ecotrin Regular or Extra Strength Naprosyn Uracel  Atromid-S Efficin Naproxen Ursinus  Auranofin Capsules Elmiron Neocylate Vanquish  Axotal Emagrin Norgesic Verin  Azathioprine Empirin or Empirin with Codeine Normiflo Vitamin E  Azolid Emprazil Nuprin Voltaren  Bayer Aspirin plain, buffered or children's or timed BC Tablets or powders Encaprin Orgaran Warfarin Sodium  Buff-a-Comp Enoxaparin Orudis Zorpin  Buff-a-Comp with Codeine Equegesic Os-Cal-Gesic   Buffaprin Excedrin plain, buffered or Extra Strength Oxalid   Bufferin Arthritis Strength Feldene Oxphenbutazone   Bufferin plain or Extra Strength Feldene Capsules Oxycodone with Aspirin   Bufferin with Codeine Fenoprofen Fenoprofen Pabalate or Pabalate-SF   Buffets II Flogesic Panagesic   Buffinol plain or Extra Strength Florinal or Florinal with Codeine Panwarfarin   Buf-Tabs Flurbiprofen Penicillamine   Butalbital Compound Four-way cold tablets Penicillin   Butazolidin Fragmin Pepto-Bismol   Carbenicillin Geminisyn Percodan   Carna Arthritis Reliever Geopen Persantine   Carprofen Gold's salt Persistin   Chloramphenicol Goody's Phenylbutazone   Chloromycetin Haltrain Piroxlcam   Clmetidine heparin Plaquenil   Cllnoril Hyco-pap  Ponstel   Clofibrate Hydroxy chloroquine Propoxyphen         Before stopping any of these medications, be sure to consult the physician who ordered them.  Some, such as Coumadin (Warfarin) are ordered to prevent or treat serious conditions such as "deep thrombosis", "pumonary embolisms", and other heart problems.  The amount of time that you may need off of the medication may also vary with the medication and the reason for which you were taking it.  If you are taking any of these medications, please make sure you notify your pain physician before you undergo any procedures.         Epidural Steroid Injection Patient Information  Description: The epidural space surrounds the nerves as they exit the spinal cord.  In some patients, the nerves can be compressed and inflamed by a bulging disc or a tight spinal canal (spinal stenosis).  By injecting  steroids into the epidural space, we can bring irritated nerves into direct contact with a potentially helpful medication.  These steroids act directly on the irritated nerves and can reduce swelling and inflammation which often leads to decreased pain.  Epidural steroids may be injected anywhere along the spine and from the neck to the low back depending upon the location of your pain.   After numbing the skin with local anesthetic (like Novocaine), a small needle is passed into the epidural space slowly.  You may experience a sensation of pressure while this is being done.  The entire block usually last less than 10 minutes.  Conditions which may be treated by epidural steroids:   Low back and leg pain  Neck and arm pain  Spinal stenosis  Post-laminectomy syndrome  Herpes zoster (shingles) pain  Pain from compression fractures  Preparation for the injection:  1. Do not eat any solid food or dairy products within 6 hours of your appointment.  2. You may drink clear liquids up to 2 hours before appointment.  Clear liquids include water,  black coffee, juice or soda.  No milk or cream please. 3. You may take your regular medication, including pain medications, with a sip of water before your appointment  Diabetics should hold regular insulin (if taken separately) and take 1/2 normal NPH dos the morning of the procedure.  Carry some sugar containing items with you to your appointment. 4. A driver must accompany you and be prepared to drive you home after your procedure.  5. Bring all your current medications with your. 6. An IV may be inserted and sedation may be given at the discretion of the physician.   7. A blood pressure cuff, EKG and other monitors will often be applied during the procedure.  Some patients may need to have extra oxygen administered for a short period. 8. You will be asked to provide medical information, including your allergies, prior to the procedure.  We must know immediately if you are taking blood thinners (like Coumadin/Warfarin)  Or if you are allergic to IV iodine contrast (dye). We must know if you could possible be pregnant.  Possible side-effects:  Bleeding from needle site  Infection (rare, may require surgery)  Nerve injury (rare)  Numbness & tingling (temporary)  Difficulty urinating (rare, temporary)  Spinal headache ( a headache worse with upright posture)  Light -headedness (temporary)  Pain at injection site (several days)  Decreased blood pressure (temporary)  Weakness in arm/leg (temporary)  Pressure sensation in back/neck (temporary)  Call if you experience:  Fever/chills associated with headache or increased back/neck pain.  Headache worsened by an upright position.  New onset weakness or numbness of an extremity below the injection site  Hives or difficulty breathing (go to the emergency room)  Inflammation or drainage at the infection site  Severe back/neck pain  Any new symptoms which are concerning to you  Please note:  Although the local anesthetic  injected can often make your back or neck feel good for several hours after the injection, the pain will likely return.  It takes 3-7 days for steroids to work in the epidural space.  You may not notice any pain relief for at least that one week.  If effective, we will often do a series of three injections spaced 3-6 weeks apart to maximally decrease your pain.  After the initial series, we generally will wait several months before considering a repeat injection of the same type.  If you  have any questions, please call [phone] [facility]  General Discharge Instructions :  If you need to reach your doctor call: Monday-Friday 8:00 am - 4:00 pm at [phone] or toll free [phone].  After clinic hours [phone] to have operator reach doctor.  Bring all of your medication bottles to all your appointments in the pain clinic.  To cancel or reschedule your appointment with Pain Management please remember to call 24 hours in advance to avoid a fee.  Refer to the educational materials which you have been given on: General Risks, I had my Procedure. Discharge Instructions, Post Sedation.  Post Procedure Instructions:  The drugs you were given will stay in your system until tomorrow, so for the next 24 hours you should not drive, make any legal decisions or drink any alcoholic beverages.  You may eat anything you prefer, but it is better to start with liquids then soups and crackers, and gradually work up to solid foods.  Please notify your doctor immediately if you have any unusual bleeding, trouble breathing or pain that is not related to your normal pain.  Depending on the type of procedure that was done, some parts of your body may feel week and/or numb.  This usually clears up by tonight or the next day.  Walk with the use of an assistive device or accompanied by an adult for the 24 hours.  You may use ice  on the affected area for the first 24 hours.  Put ice in a Ziploc bag and cover with a towel and place against area 15 minutes on 15 minutes off.  You may switch to heat after 24 hours.

## 2014-11-10 NOTE — Progress Notes (Signed)
Safety precautions to be maintained throughout the outpatient stay will include: orient to surroundings, keep bed in low position, maintain call bell within reach at all times, provide assistance with transfer out of bed and ambulation.  

## 2014-11-10 NOTE — Progress Notes (Signed)
   Subjective:    Patient ID: Gina Costa, female    DOB: 1960/09/02, 54 y.o.   MRN: [id number]  HPI  PROCEDURE PERFORMED: Lumbar epidural steroid injection   NOTE: The patient is a 54 y.o. female who returns to [place] for further evaluation and treatment of pain involving the lumbar and lower extremity region. MRI revealed the patient to be with degenerative changes of the lumbar spine with multilevel facet arthropathy L2-3, L3-4, L4-5, and L5-S1.Marland Kitchen The risks, benefits, and expectations of the procedure have been discussed and explained to the patient who was understanding and in agreement with suggested treatment plan. We will proceed with interventional treatment as discussed and explained to the patient who is willing to proceed with procedure as planned.   DESCRIPTION OF PROCEDURE: Lumbar epidural steroid injection with EKG, blood pressure, pulse, and pulse oximetry monitoring. The procedure was performed with the patient in the prone position under fluoroscopic guidance. A local anesthetic skin wheal of 1.5% plain lidocaine was accomplished at proposed entry site. An 18-gauge Tuohy epidural needle was inserted at the L 4 vertebral body level right of the midline via loss-of-resistance technique with negative heme and negative CSF return. A total of 4 mL of Preservative-Free normal saline with 40 mg of Kenalog injected incrementally via epidurally placed needle. Needle removed.  The patient tolerated the injection well.   PLAN:   1. Medications: We will continue presently prescribed medications. 2. Will consider modification of treatment regimen pending response to treatment rendered on today's visit and follow-up evaluation. 3. The patient is to follow-up with primary care physician regarding blood pressure and general medical condition status post lumbar epidural steroid injection performed on today's visit. 4. Surgical evaluation. We attempted to schedule neurosurgical  reevaluation today. We will also ask Dr. Posey Pronto and Freddy Finner to schedule appointment if we are unable to obtain neurosurgical evaluation appointment. We'll also follow-up with Dr. Trenton Gammon for neurosurgical reevaluation as discussed in addition to neurosurgical evaluation at a tertiary center 5. Neurological evaluation. 6. The patient may be a candidate for radiofrequency procedures, implantation device, and other treatment pending response to treatment and follow-up evaluation. 7. The patient has been advised to adhere to proper body mechanics and avoid activities which appear to aggravate condition. 8. The patient has been advised to call the Pain Management Center prior to scheduled return appointment should there be significant change in condition or should there be sign  The patient is understanding and agrees with the suggested  treatment plan      Review of Systems     Objective:   Physical Exam        Assessment & Plan:

## 2014-11-11 ENCOUNTER — Telehealth: Payer: Self-pay | Admitting: *Deleted

## 2014-11-11 NOTE — Telephone Encounter (Signed)
Patient denies any complications post procedure 

## 2014-11-18 ENCOUNTER — Ambulatory Visit: Payer: Medicaid Other | Attending: Pain Medicine | Admitting: Pain Medicine

## 2014-11-18 ENCOUNTER — Encounter: Payer: Self-pay | Admitting: Pain Medicine

## 2014-11-18 VITALS — BP 119/75 | HR 77 | Temp 96.1°F | Resp 18 | Ht 64.0 in | Wt 180.0 lb

## 2014-11-18 DIAGNOSIS — M5136 Other intervertebral disc degeneration, lumbar region: Secondary | ICD-10-CM | POA: Diagnosis not present

## 2014-11-18 DIAGNOSIS — M545 Low back pain: Secondary | ICD-10-CM | POA: Diagnosis present

## 2014-11-18 DIAGNOSIS — M48062 Spinal stenosis, lumbar region with neurogenic claudication: Secondary | ICD-10-CM

## 2014-11-18 DIAGNOSIS — G588 Other specified mononeuropathies: Secondary | ICD-10-CM | POA: Diagnosis not present

## 2014-11-18 DIAGNOSIS — M4806 Spinal stenosis, lumbar region: Secondary | ICD-10-CM | POA: Diagnosis not present

## 2014-11-18 DIAGNOSIS — M5137 Other intervertebral disc degeneration, lumbosacral region: Secondary | ICD-10-CM

## 2014-11-18 DIAGNOSIS — M79604 Pain in right leg: Secondary | ICD-10-CM | POA: Diagnosis present

## 2014-11-18 DIAGNOSIS — M5416 Radiculopathy, lumbar region: Secondary | ICD-10-CM

## 2014-11-18 DIAGNOSIS — M533 Sacrococcygeal disorders, not elsewhere classified: Secondary | ICD-10-CM

## 2014-11-18 DIAGNOSIS — M51379 Other intervertebral disc degeneration, lumbosacral region without mention of lumbar back pain or lower extremity pain: Secondary | ICD-10-CM

## 2014-11-18 DIAGNOSIS — M1288 Other specific arthropathies, not elsewhere classified, other specified site: Secondary | ICD-10-CM | POA: Diagnosis not present

## 2014-11-18 DIAGNOSIS — M79605 Pain in left leg: Secondary | ICD-10-CM | POA: Diagnosis present

## 2014-11-18 DIAGNOSIS — M47816 Spondylosis without myelopathy or radiculopathy, lumbar region: Secondary | ICD-10-CM

## 2014-11-18 MED ORDER — GABAPENTIN 300 MG PO CAPS
ORAL_CAPSULE | ORAL | Status: DC
Start: 2014-11-18 — End: 2014-12-11

## 2014-11-18 MED ORDER — OXYCODONE HCL 5 MG PO TABS: ORAL_TABLET | ORAL | Status: DC

## 2014-11-18 NOTE — Progress Notes (Signed)
Discharge patient home ambulatory at 0915 hrs Script given for oxycodone Teach back done

## 2014-11-18 NOTE — Progress Notes (Signed)
   Subjective:    Patient ID: Gina Costa, female    DOB: December 23, 1960, 54 y.o.   MRN: [id number]  HPI  Patient is 54 year old female returns to [place] for further evaluation and treatment of pain involving the lower back and lower extremity region. Patient notes significant improvement with prior procedure performed in [facility] Management Center. We discussed patient's overall condition and wish to have patient undergo neurosurgical reevaluation. We will call Dr. Posey Pronto and Elsworth Soho to request patient be rescheduled for neurosurgical evaluation of lumbar lower extremity pain. We will proceed with lumbar epidural steroid injection at time return appointment in attempt to decrease severity of symptoms and hopefully retard progression of patient's symptoms and avoid the need for more involved treatment. The patient was understanding and in agreement with suggested treatment plan.  Review of Systems     Objective:   Physical Exam  Physical examination revealed patient to be with tends to palpation of the splenius capitis and occipitalis musculature region of mild degree. No significant tends to palpation of the acromioclavicular glenohumeral joint region was noted. Patient appeared to be with bilaterally equal grip strength. Tinel and Phalen's maneuver were without increase of pain of significant degree. Palpation over the lumbar paraspinal musculatures and lumbar facet region was a tends to palpation of moderate degree. Lateral bending and rotation and extension and palpation of the lumbar facets reproduce moderate discomfort. Straight leg raising was tolerates approximate 20 without increase of pain with dorsiflexion noted. There appeared to be decreased EHL strength. Question decreased sensation along the L5 dermatomal distribution. Negative clonus negative Homans. DTRs difficult to elicit patient had difficulty relaxing. Abdomen nontender and no costovertebral tenderness noted.       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  F/U surgical evaluation.. We will request Dr. Posey Pronto and Dr. Elsworth Soho to reschedule neurosurgical evaluation of patient's lumbar lower extremity pain and paresthesias and weakness  F/U neurological evaluation  May consider radiofrequency rhizolysis or intraspinal procedures pending response to present treatment and F/U evaluation.  Patient to call Pain Management Center should patient have concerns prior to scheduled return appointment.

## 2014-11-18 NOTE — Patient Instructions (Addendum)
Continue present medications Neurontin and oxycodone  Lumbar epidural steroid to be performed 12/03/2014  F/U PCP for evaliation of  BP and general medical  condition. Please see Dr Elsworth Soho and Dr. Posey Pronto for evaluation of lower back and lower extremity pain and have them schedule neurosurgical evaluation for you  F/U surgical evaluation. We will attempt to reschedule neurosurgical evaluation of lower back and lower extremity pain paresthesias and weakness  F/U neurological evaluation.  May consider radiofrequency rhizolysis or intraspinal procedures pending response to present treatment and F/U evaluation.  Patient to call [facility] should patient have concerns prior to scheduled return appointment. Epidural Steroid Injection An epidural steroid injection is given to relieve pain in your neck, back, or legs that is caused by the irritation or swelling of a nerve root. This procedure involves injecting a steroid and numbing medicine (anesthetic) into the epidural space. The epidural space is the space between the outer covering of your spinal cord and the bones that form your backbone (vertebra).  LET [facility] CARE PROVIDER KNOW ABOUT:   Any allergies you have.  All medicines you are taking, including vitamins, herbs, eye drops, creams, and over-the-counter medicines such as aspirin.  Previous problems you or members of your family have had with the use of anesthetics.  Any blood disorders or blood clotting disorders you have.  Previous surgeries you have had.  Medical conditions you have. RISKS AND COMPLICATIONS Generally, this is a safe procedure. However, as with any procedure, complications can occur. Possible complications of epidural steroid injection include:  Headache.  Bleeding.  Infection.  Allergic reaction to the medicines.  Damage to your nerves. The response to this procedure depends on the underlying cause of the pain and its duration. People who  have long-term (chronic) pain are less likely to benefit from epidural steroids than are those people whose pain comes on strong and suddenly. BEFORE THE PROCEDURE   Ask your health care provider about changing or stopping your regular medicines. You may be advised to stop taking blood-thinning medicines a few days before the procedure.  You may be given medicines to reduce anxiety.  Arrange for someone to take you home after the procedure. PROCEDURE   You will remain awake during the procedure. You may receive medicine to make you relaxed.  You will be asked to lie on your stomach.  The injection site will be cleaned.  The injection site will be numbed with a medicine (local anesthetic).  A needle will be injected through your skin into the epidural space.  Your health care provider will use an X-ray machine to ensure that the steroid is delivered closest to the affected nerve. You may have minimal discomfort at this time.  Once the needle is in the right position, the local anesthetic and the steroid will be injected into the epidural space.  The needle will then be removed and a bandage will be applied to the injection site. AFTER THE PROCEDURE   You may be monitored for a short time before you go home.  You may feel weakness or numbness in your arm or leg, which disappears within hours.  You may be allowed to eat, drink, and take your regular medicine.  You may have soreness at the site of the injection. Document Released: 08/16/2007 Document Revised: 01/09/2013 Document Reviewed: 10/26/2012 [facility] Patient Information 2015 [place], [place]. This information is not intended to replace advice given to you by your health care provider. Make sure you discuss any  questions you have with your health care provider. GENERAL RISKS AND COMPLICATIONS  What are the risk, side effects and possible complications? Generally speaking, most procedures are safe.  However, with any procedure  there are risks, side effects, and the possibility of complications.  The risks and complications are dependent upon the sites that are lesioned, or the type of nerve block to be performed.  The closer the procedure is to the spine, the more serious the risks are.  Great care is taken when placing the radio frequency needles, block needles or lesioning probes, but sometimes complications can occur.  Infection: Any time there is an injection through the skin, there is a risk of infection.  This is why sterile conditions are used for these blocks.  There are four possible types of infection.  Localized skin infection.  Central Nervous System Infection-This can be in the form of Meningitis, which can be deadly.  Epidural Infections-This can be in the form of an epidural abscess, which can cause pressure inside of the spine, causing compression of the spinal cord with subsequent paralysis. This would require an emergency surgery to decompress, and there are no guarantees that the patient would recover from the paralysis.  Discitis-This is an infection of the intervertebral discs.  It occurs in about 1% of discography procedures.  It is difficult to treat and it may lead to surgery.        2. Pain: the needles have to go through skin and soft tissues, will cause soreness.       3. Damage to internal structures:  The nerves to be lesioned may be near blood vessels or    other nerves which can be potentially damaged.       4. Bleeding: Bleeding is more common if the patient is taking blood thinners such as  aspirin, Coumadin, Ticiid, Plavix, etc., or if he/she have some genetic predisposition  such as hemophilia. Bleeding into the spinal canal can cause compression of the spinal  cord with subsequent paralysis.  This would require an emergency surgery to  decompress and there are no guarantees that the patient would recover from the  paralysis.       5. Pneumothorax:  Puncturing of a lung is a possibility,  every time a needle is introduced in  the area of the chest or upper back.  Pneumothorax refers to free air around the  collapsed lung(s), inside of the thoracic cavity (chest cavity).  Another two possible  complications related to a similar event would include: Hemothorax and Chylothorax.   These are variations of the Pneumothorax, where instead of air around the collapsed  lung(s), you may have blood or chyle, respectively.       6. Spinal headaches: They may occur with any procedures in the area of the spine.       7. Persistent CSF (Cerebro-Spinal Fluid) leakage: This is a rare problem, but may occur  with prolonged intrathecal or epidural catheters either due to the formation of a fistulous  track or a dural tear.       8. Nerve damage: By working so close to the spinal cord, there is always a possibility of  nerve damage, which could be as serious as a permanent spinal cord injury with  paralysis.       9. Death:  Although rare, severe deadly allergic reactions known as "Anaphylactic  reaction" can occur to any of the medications used.      10. Worsening of  the symptoms:  We can always make thing worse.  What are the chances of something like this happening? Chances of any of this occuring are extremely low.  By statistics, you have more of a chance of getting killed in a motor vehicle accident: while driving to the hospital than any of the above occurring .  Nevertheless, you should be aware that they are possibilities.  In general, it is similar to taking a shower.  Everybody knows that you can slip, hit your head and get killed.  Does that mean that you should not shower again?  Nevertheless always keep in mind that statistics do not mean anything if you happen to be on the wrong side of them.  Even if a procedure has a 1 (one) in a 1,000,000 (million) chance of going wrong, it you happen to be that one..Also, keep in mind that by statistics, you have more of a chance of having something go wrong  when taking medications.  Who should not have this procedure? If you are on a blood thinning medication (e.g. Coumadin, Plavix, see list of "Blood Thinners"), or if you have an active infection going on, you should not have the procedure.  If you are taking any blood thinners, please inform your physician.  How should I prepare for this procedure?  Do not eat or drink anything at least six hours prior to the procedure.  Bring a driver with you .  It cannot be a taxi.  Come accompanied by an adult that can drive you back, and that is strong enough to help you if your legs get weak or numb from the local anesthetic.  Take all of your medicines the morning of the procedure with just enough water to swallow them.  If you have diabetes, make sure that you are scheduled to have your procedure done first thing in the morning, whenever possible.  If you have diabetes, take only half of your insulin dose and notify our nurse that you have done so as soon as you arrive at the clinic.  If you are diabetic, but only take blood sugar pills (oral hypoglycemic), then do not take them on the morning of your procedure.  You may take them after you have had the procedure.  Do not take aspirin or any aspirin-containing medications, at least eleven (11) days prior to the procedure.  They may prolong bleeding.  Wear loose fitting clothing that may be easy to take off and that you would not mind if it got stained with Betadine or blood.  Do not wear any jewelry or perfume  Remove any nail coloring.  It will interfere with some of our monitoring equipment.  NOTE: Remember that this is not meant to be interpreted as a complete list of all possible complications.  Unforeseen problems may occur.  BLOOD THINNERS The following drugs contain aspirin or other products, which can cause increased bleeding during surgery and should not be taken for 2 weeks prior to and 1 week after surgery.  If you should need take  something for relief of minor pain, you may take acetaminophen which is found in Tylenol,m Datril, Anacin-3 and Panadol. It is not blood thinner. The products listed below are.  Do not take any of the products listed below in addition to any listed on your instruction sheet.  A.P.C or A.P.C with Codeine Codeine Phosphate Capsules #3 Ibuprofen Ridaura  ABC compound Congesprin Imuran rimadil  Advil Cope Indocin Robaxisal  Alka-Seltzer Effervescent Pain  Reliever and Antacid Coricidin or Coricidin-D  Indomethacin Rufen  Alka-Seltzer plus Cold Medicine Cosprin Ketoprofen S-A-C Tablets  Anacin Analgesic Tablets or Capsules Coumadin Korlgesic Salflex  Anacin Extra Strength Analgesic tablets or capsules CP-2 Tablets Lanoril Salicylate  Anaprox Cuprimine Capsules Levenox Salocol  Anexsia-D Dalteparin Magan Salsalate  Anodynos Darvon compound Magnesium Salicylate Sine-off  Ansaid Dasin Capsules Magsal Sodium Salicylate  Anturane Depen Capsules Marnal Soma  APF Arthritis pain formula Dewitt's Pills Measurin Stanback  Argesic Dia-Gesic Meclofenamic Sulfinpyrazone  Arthritis Bayer Timed Release Aspirin Diclofenac Meclomen Sulindac  Arthritis pain formula Anacin Dicumarol Medipren Supac  Analgesic (Safety coated) Arthralgen Diffunasal Mefanamic Suprofen  Arthritis Strength Bufferin Dihydrocodeine Mepro Compound Suprol  Arthropan liquid Dopirydamole Methcarbomol with Aspirin Synalgos  ASA tablets/Enseals Disalcid Micrainin Tagament  Ascriptin Doan's Midol Talwin  Ascriptin A/D Dolene Mobidin Tanderil  Ascriptin Extra Strength Dolobid Moblgesic Ticlid  Ascriptin with Codeine Doloprin or Doloprin with Codeine Momentum Tolectin  Asperbuf Duoprin Mono-gesic Trendar  Aspergum Duradyne Motrin or Motrin IB Triminicin  Aspirin plain, buffered or enteric coated Durasal Myochrisine Trigesic  Aspirin Suppositories Easprin Nalfon Trillsate  Aspirin with Codeine Ecotrin Regular or Extra Strength Naprosyn Uracel   Atromid-S Efficin Naproxen Ursinus  Auranofin Capsules Elmiron Neocylate Vanquish  Axotal Emagrin Norgesic Verin  Azathioprine Empirin or Empirin with Codeine Normiflo Vitamin E  Azolid Emprazil Nuprin Voltaren  Bayer Aspirin plain, buffered or children's or timed BC Tablets or powders Encaprin Orgaran Warfarin Sodium  Buff-a-Comp Enoxaparin Orudis Zorpin  Buff-a-Comp with Codeine Equegesic Os-Cal-Gesic   Buffaprin Excedrin plain, buffered or Extra Strength Oxalid   Bufferin Arthritis Strength Feldene Oxphenbutazone   Bufferin plain or Extra Strength Feldene Capsules Oxycodone with Aspirin   Bufferin with Codeine Fenoprofen Fenoprofen Pabalate or Pabalate-SF   Buffets II Flogesic Panagesic   Buffinol plain or Extra Strength Florinal or Florinal with Codeine Panwarfarin   Buf-Tabs Flurbiprofen Penicillamine   Butalbital Compound Four-way cold tablets Penicillin   Butazolidin Fragmin Pepto-Bismol   Carbenicillin Geminisyn Percodan   Carna Arthritis Reliever Geopen Persantine   Carprofen Gold's salt Persistin   Chloramphenicol Goody's Phenylbutazone   Chloromycetin Haltrain Piroxlcam   Clmetidine heparin Plaquenil   Cllnoril Hyco-pap Ponstel   Clofibrate Hydroxy chloroquine Propoxyphen         Before stopping any of these medications, be sure to consult the physician who ordered them.  Some, such as Coumadin (Warfarin) are ordered to prevent or treat serious conditions such as "deep thrombosis", "pumonary embolisms", and other heart problems.  The amount of time that you may need off of the medication may also vary with the medication and the reason for which you were taking it.  If you are taking any of these medications, please make sure you notify your pain physician before you undergo any procedures.

## 2014-11-18 NOTE — Progress Notes (Signed)
Safety precautions to be maintained throughout the outpatient stay will include: orient to surroundings, keep bed in low position, maintain call bell within reach at all times, provide assistance with transfer out of bed and ambulation.  

## 2014-11-21 DIAGNOSIS — K8020 Calculus of gallbladder without cholecystitis without obstruction: Secondary | ICD-10-CM

## 2014-11-21 HISTORY — DX: Calculus of gallbladder without cholecystitis without obstruction: K80.20

## 2014-11-28 ENCOUNTER — Other Ambulatory Visit: Payer: Self-pay | Admitting: Pain Medicine

## 2014-11-28 ENCOUNTER — Telehealth (HOSPITAL_COMMUNITY): Payer: Self-pay | Admitting: *Deleted

## 2014-11-28 ENCOUNTER — Other Ambulatory Visit: Payer: Self-pay | Admitting: Primary Care

## 2014-11-28 DIAGNOSIS — R7989 Other specified abnormal findings of blood chemistry: Secondary | ICD-10-CM

## 2014-11-28 DIAGNOSIS — R945 Abnormal results of liver function studies: Secondary | ICD-10-CM

## 2014-11-28 NOTE — Telephone Encounter (Signed)
AUTH# [id number]

## 2014-12-01 ENCOUNTER — Ambulatory Visit
Admission: RE | Admit: 2014-12-01 | Discharge: 2014-12-01 | Disposition: A | Discharge status: Home / Self Care | Payer: Medicaid Other | Source: Ambulatory Visit | Attending: Primary Care | Admitting: Primary Care

## 2014-12-01 DIAGNOSIS — K828 Other specified diseases of gallbladder: Secondary | ICD-10-CM | POA: Insufficient documentation

## 2014-12-01 DIAGNOSIS — R945 Abnormal results of liver function studies: Secondary | ICD-10-CM | POA: Diagnosis not present

## 2014-12-01 DIAGNOSIS — R7989 Other specified abnormal findings of blood chemistry: Secondary | ICD-10-CM

## 2014-12-02 ENCOUNTER — Ambulatory Visit: Payer: Medicaid Other

## 2014-12-03 ENCOUNTER — Encounter: Payer: Self-pay | Admitting: Pain Medicine

## 2014-12-03 ENCOUNTER — Ambulatory Visit: Payer: Medicaid Other | Attending: Pain Medicine | Admitting: Pain Medicine

## 2014-12-03 VITALS — BP 100/60 | HR 77 | Temp 98.4°F | Resp 16 | Ht 66.0 in | Wt 180.0 lb

## 2014-12-03 DIAGNOSIS — M5137 Other intervertebral disc degeneration, lumbosacral region: Secondary | ICD-10-CM

## 2014-12-03 DIAGNOSIS — G588 Other specified mononeuropathies: Secondary | ICD-10-CM

## 2014-12-03 DIAGNOSIS — M5416 Radiculopathy, lumbar region: Secondary | ICD-10-CM

## 2014-12-03 DIAGNOSIS — M51379 Other intervertebral disc degeneration, lumbosacral region without mention of lumbar back pain or lower extremity pain: Secondary | ICD-10-CM

## 2014-12-03 DIAGNOSIS — M545 Low back pain, unspecified: Secondary | ICD-10-CM | POA: Insufficient documentation

## 2014-12-03 DIAGNOSIS — M1288 Other specific arthropathies, not elsewhere classified, other specified site: Secondary | ICD-10-CM | POA: Diagnosis not present

## 2014-12-03 DIAGNOSIS — M5136 Other intervertebral disc degeneration, lumbar region: Secondary | ICD-10-CM | POA: Insufficient documentation

## 2014-12-03 DIAGNOSIS — M79605 Pain in left leg: Secondary | ICD-10-CM | POA: Insufficient documentation

## 2014-12-03 DIAGNOSIS — M47816 Spondylosis without myelopathy or radiculopathy, lumbar region: Secondary | ICD-10-CM

## 2014-12-03 DIAGNOSIS — M533 Sacrococcygeal disorders, not elsewhere classified: Secondary | ICD-10-CM

## 2014-12-03 DIAGNOSIS — M79604 Pain in right leg: Secondary | ICD-10-CM | POA: Diagnosis present

## 2014-12-03 DIAGNOSIS — G8929 Other chronic pain: Secondary | ICD-10-CM | POA: Insufficient documentation

## 2014-12-03 DIAGNOSIS — M25559 Pain in unspecified hip: Secondary | ICD-10-CM | POA: Insufficient documentation

## 2014-12-03 DIAGNOSIS — M48062 Spinal stenosis, lumbar region with neurogenic claudication: Secondary | ICD-10-CM

## 2014-12-03 MED ORDER — LIDOCAINE HCL (PF) 1 % IJ SOLN
INTRAMUSCULAR | Status: AC
  Administered 2014-12-03: 08:00:00
  Filled 2014-12-03: qty 5

## 2014-12-03 MED ORDER — TRIAMCINOLONE ACETONIDE 40 MG/ML IJ SUSP
INTRAMUSCULAR | Status: AC
  Administered 2014-12-03: 08:00:00
  Filled 2014-12-03: qty 1

## 2014-12-03 MED ORDER — BUPIVACAINE HCL (PF) 0.25 % IJ SOLN
INTRAMUSCULAR | Status: AC
  Filled 2014-12-03: qty 30

## 2014-12-03 MED ORDER — ORPHENADRINE CITRATE 30 MG/ML IJ SOLN
INTRAMUSCULAR | Status: AC
  Filled 2014-12-03: qty 2

## 2014-12-03 MED ORDER — SODIUM CHLORIDE 0.9 % IJ SOLN
INTRAMUSCULAR | Status: AC
  Administered 2014-12-03: 08:00:00
  Filled 2014-12-03: qty 20

## 2014-12-03 NOTE — Progress Notes (Signed)
Safety precautions to be maintained throughout the outpatient stay will include: orient to surroundings, keep bed in low position, maintain call bell within reach at all times, provide assistance with transfer out of bed and ambulation.  

## 2014-12-03 NOTE — Patient Instructions (Addendum)
Continue present medicationsPain Management Discharge Instructions  General Discharge Instructions :  If you need to reach your doctor call: Monday-Friday 8:00 am - 4:00 pm at [phone] or toll free [phone].  After clinic hours [phone] to have operator reach doctor.  Bring all of your medication bottles to all your appointments in the pain clinic.  To cancel or reschedule your appointment with Pain Management please remember to call 24 hours in advance to avoid a fee.  Refer to the educational materials which you have been given on: General Risks, I had my Procedure. Discharge Instructions, Post Sedation.  Post Procedure Instructions:  The drugs you were given will stay in your system until tomorrow, so for the next 24 hours you should not drive, make any legal decisions or drink any alcoholic beverages.  You may eat anything you prefer, but it is better to start with liquids then soups and crackers, and gradually work up to solid foods.  Please notify your doctor immediately if you have any unusual bleeding, trouble breathing or pain that is not related to your normal pain.  Depending on the type of procedure that was done, some parts of your body may feel week and/or numb.  This usually clears up by tonight or the next day.  Walk with the use of an assistive device or accompanied by an adult for the 24 hours.  You may use ice on the affected area for the first 24 hours.  Put ice in a Ziploc bag and cover with a towel and place against area 15 minutes on 15 minutes off.  You may switch to heat after 24 hours.GENERAL RISKS AND COMPLICATIONS  What are the risk, side effects and possible complications? Generally speaking, most procedures are safe.  However, with any procedure there are risks, side effects, and the possibility of complications.  The risks and complications are dependent upon the sites that are lesioned, or the type of nerve block to be performed.  The closer  the procedure is to the spine, the more serious the risks are.  Great care is taken when placing the radio frequency needles, block needles or lesioning probes, but sometimes complications can occur. 1. Infection: Any time there is an injection through the skin, there is a risk of infection.  This is why sterile conditions are used for these blocks.  There are four possible types of infection. 1. Localized skin infection. 2. Central Nervous System Infection-This can be in the form of Meningitis, which can be deadly. 3. Epidural Infections-This can be in the form of an epidural abscess, which can cause pressure inside of the spine, causing compression of the spinal cord with subsequent paralysis. This would require an emergency surgery to decompress, and there are no guarantees that the patient would recover from the paralysis. 4. Discitis-This is an infection of the intervertebral discs.  It occurs in about 1% of discography procedures.  It is difficult to treat and it may lead to surgery.        2. Pain: the needles have to go through skin and soft tissues, will cause soreness.       3. Damage to internal structures:  The nerves to be lesioned may be near blood vessels or    other nerves which can be potentially damaged.       4. Bleeding: Bleeding is more common if the patient is taking blood thinners such as  aspirin, Coumadin, Ticiid, Plavix, etc., or if he/she have some genetic predisposition  such as hemophilia. Bleeding into the spinal canal can cause compression of the spinal  cord with subsequent paralysis.  This would require an emergency surgery to  decompress and there are no guarantees that the patient would recover from the  paralysis.       5. Pneumothorax:  Puncturing of a lung is a possibility, every time a needle is introduced in  the area of the chest or upper back.  Pneumothorax refers to free air around the  collapsed lung(s), inside of the thoracic cavity (chest cavity).  Another two  possible  complications related to a similar event would include: Hemothorax and Chylothorax.   These are variations of the Pneumothorax, where instead of air around the collapsed  lung(s), you may have blood or chyle, respectively.       6. Spinal headaches: They may occur with any procedures in the area of the spine.       7. Persistent CSF (Cerebro-Spinal Fluid) leakage: This is a rare problem, but may occur  with prolonged intrathecal or epidural catheters either due to the formation of a fistulous  track or a dural tear.       8. Nerve damage: By working so close to the spinal cord, there is always a possibility of  nerve damage, which could be as serious as a permanent spinal cord injury with  paralysis.       9. Death:  Although rare, severe deadly allergic reactions known as "Anaphylactic  reaction" can occur to any of the medications used.      10. Worsening of the symptoms:  We can always make thing worse.  What are the chances of something like this happening? Chances of any of this occuring are extremely low.  By statistics, you have more of a chance of getting killed in a motor vehicle accident: while driving to the hospital than any of the above occurring .  Nevertheless, you should be aware that they are possibilities.  In general, it is similar to taking a shower.  Everybody knows that you can slip, hit your head and get killed.  Does that mean that you should not shower again?  Nevertheless always keep in mind that statistics do not mean anything if you happen to be on the wrong side of them.  Even if a procedure has a 1 (one) in a 1,000,000 (million) chance of going wrong, it you happen to be that one..Also, keep in mind that by statistics, you have more of a chance of having something go wrong when taking medications.  Who should not have this procedure? If you are on a blood thinning medication (e.g. Coumadin, Plavix, see list of "Blood Thinners"), or if you have an active infection  going on, you should not have the procedure.  If you are taking any blood thinners, please inform your physician.  How should I prepare for this procedure?  Do not eat or drink anything at least six hours prior to the procedure.  Bring a driver with you .  It cannot be a taxi.  Come accompanied by an adult that can drive you back, and that is strong enough to help you if your legs get weak or numb from the local anesthetic.  Take all of your medicines the morning of the procedure with just enough water to swallow them.  If you have diabetes, make sure that you are scheduled to have your procedure done first thing in the morning, whenever possible.  If you  have diabetes, take only half of your insulin dose and notify our nurse that you have done so as soon as you arrive at the clinic.  If you are diabetic, but only take blood sugar pills (oral hypoglycemic), then do not take them on the morning of your procedure.  You may take them after you have had the procedure.  Do not take aspirin or any aspirin-containing medications, at least eleven (11) days prior to the procedure.  They may prolong bleeding.  Wear loose fitting clothing that may be easy to take off and that you would not mind if it got stained with Betadine or blood.  Do not wear any jewelry or perfume  Remove any nail coloring.  It will interfere with some of our monitoring equipment.  NOTE: Remember that this is not meant to be interpreted as a complete list of all possible complications.  Unforeseen problems may occur.  BLOOD THINNERS The following drugs contain aspirin or other products, which can cause increased bleeding during surgery and should not be taken for 2 weeks prior to and 1 week after surgery.  If you should need take something for relief of minor pain, you may take acetaminophen which is found in Tylenol,m Datril, Anacin-3 and Panadol. It is not blood thinner. The products listed below are.  Do not take any of  the products listed below in addition to any listed on your instruction sheet.  A.P.C or A.P.C with Codeine Codeine Phosphate Capsules #3 Ibuprofen Ridaura  ABC compound Congesprin Imuran rimadil  Advil Cope Indocin Robaxisal  Alka-Seltzer Effervescent Pain Reliever and Antacid Coricidin or Coricidin-D  Indomethacin Rufen  Alka-Seltzer plus Cold Medicine Cosprin Ketoprofen S-A-C Tablets  Anacin Analgesic Tablets or Capsules Coumadin Korlgesic Salflex  Anacin Extra Strength Analgesic tablets or capsules CP-2 Tablets Lanoril Salicylate  Anaprox Cuprimine Capsules Levenox Salocol  Anexsia-D Dalteparin Magan Salsalate  Anodynos Darvon compound Magnesium Salicylate Sine-off  Ansaid Dasin Capsules Magsal Sodium Salicylate  Anturane Depen Capsules Marnal Soma  APF Arthritis pain formula Dewitt's Pills Measurin Stanback  Argesic Dia-Gesic Meclofenamic Sulfinpyrazone  Arthritis Bayer Timed Release Aspirin Diclofenac Meclomen Sulindac  Arthritis pain formula Anacin Dicumarol Medipren Supac  Analgesic (Safety coated) Arthralgen Diffunasal Mefanamic Suprofen  Arthritis Strength Bufferin Dihydrocodeine Mepro Compound Suprol  Arthropan liquid Dopirydamole Methcarbomol with Aspirin Synalgos  ASA tablets/Enseals Disalcid Micrainin Tagament  Ascriptin Doan's Midol Talwin  Ascriptin A/D Dolene Mobidin Tanderil  Ascriptin Extra Strength Dolobid Moblgesic Ticlid  Ascriptin with Codeine Doloprin or Doloprin with Codeine Momentum Tolectin  Asperbuf Duoprin Mono-gesic Trendar  Aspergum Duradyne Motrin or Motrin IB Triminicin  Aspirin plain, buffered or enteric coated Durasal Myochrisine Trigesic  Aspirin Suppositories Easprin Nalfon Trillsate  Aspirin with Codeine Ecotrin Regular or Extra Strength Naprosyn Uracel  Atromid-S Efficin Naproxen Ursinus  Auranofin Capsules Elmiron Neocylate Vanquish  Axotal Emagrin Norgesic Verin  Azathioprine Empirin or Empirin with Codeine Normiflo Vitamin E  Azolid  Emprazil Nuprin Voltaren  Bayer Aspirin plain, buffered or children's or timed BC Tablets or powders Encaprin Orgaran Warfarin Sodium  Buff-a-Comp Enoxaparin Orudis Zorpin  Buff-a-Comp with Codeine Equegesic Os-Cal-Gesic   Buffaprin Excedrin plain, buffered or Extra Strength Oxalid   Bufferin Arthritis Strength Feldene Oxphenbutazone   Bufferin plain or Extra Strength Feldene Capsules Oxycodone with Aspirin   Bufferin with Codeine Fenoprofen Fenoprofen Pabalate or Pabalate-SF   Buffets II Flogesic Panagesic   Buffinol plain or Extra Strength Florinal or Florinal with Codeine Panwarfarin   Buf-Tabs Flurbiprofen Penicillamine   Butalbital Compound Four-way  cold tablets Penicillin   Butazolidin Fragmin Pepto-Bismol   Carbenicillin Geminisyn Percodan   Carna Arthritis Reliever Geopen Persantine   Carprofen Gold's salt Persistin   Chloramphenicol Goody's Phenylbutazone   Chloromycetin Haltrain Piroxlcam   Clmetidine heparin Plaquenil   Cllnoril Hyco-pap Ponstel   Clofibrate Hydroxy chloroquine Propoxyphen         Before stopping any of these medications, be sure to consult the physician who ordered them.  Some, such as Coumadin (Warfarin) are ordered to prevent or treat serious conditions such as "deep thrombosis", "pumonary embolisms", and other heart problems.  The amount of time that you may need off of the medication may also vary with the medication and the reason for which you were taking it.  If you are taking any of these medications, please make sure you notify your pain physician before you undergo any procedures.           F/U PCP for evaliation of  BP and general medical  condition.  F/U surgical evaluation. Neurosurgical reevaluation as discussed F/U neurological evaluation with Dr. Melrose Nakayama as planned   May consider radiofrequency rhizolysis or intraspinal procedures pending response to present treatment and F/U evaluation.  Patient to call Pain Management Center should  patient have concerns prior to scheduled return appointment.

## 2014-12-03 NOTE — Progress Notes (Signed)
   Subjective:    Patient ID: Gina Costa, female    DOB: [birth year], 54 y.o.   MRN: [id number]  HPI  PROCEDURE PERFORMED: Lumbar epidural steroid injection   NOTE: The patient is a 54 y.o. female who returns to [facility] for further evaluation and treatment of pain involving the lumbar and lower extremity region. MRI revealed the patient to be with degenerative changes lumbar spine multilevel facet arthropathy asymmetric edema about the right facet joint, L5-S1 level most involved, some progression since prior exam, multilevel facet arthropathy L2-3, L3-4, L4-5, and L5-S1. Patient is with severe pain aggravated by standing walking with increased weakness after standing and walking for significant period of time. There is concern regarding stenosis contributing to patient's symptomatology in addition to facet arthropathy. The risks, benefits, and expectations of the procedure have been discussed and explained to the patient who was understanding and in agreement with suggested treatment plan. We will proceed with lumbar epidural steroid injection as discussed and as explained to the patient who is willing to proceed with procedure as planned.   DESCRIPTION OF PROCEDURE: Lumbar epidural steroid injection with IV Versed, IV fentanyl conscious sedation, EKG, blood pressure, pulse, and pulse oximetry monitoring. The procedure was performed with the patient in the prone position under fluoroscopic guidance. A local anesthetic skin wheal of 1.5% plain lidocaine was accomplished at proposed entry site. An 18-gauge Tuohy epidural needle was inserted at the L 4 vertebral body level right of the midline via loss-of-resistance technique with negative heme and negative CSF return. A total of 4 mL of Preservative-Free normal saline with 40 mg of Kenalog injected incrementally via epidurally placed needle. Needle was removed.    A total of 40 mg of Kenalog was utilized for the procedure.   The  patient tolerated the injection well.    PLAN:   1. Medications: We will continue presently prescribed medications Neurontin and oxycodone. 2. Will consider modification of treatment regimen pending response to treatment rendered on today's visit and follow-up evaluation. 3. The patient is to follow-up with primary care physician Dr. Posey Pronto and Elsworth Soho regarding blood pressure and general medical condition status post lumbar epidural steroid injection performed on today's visit. 4. Surgical evaluation. Dr. Trenton Gammon . Patient is being considered for additional neurosurgical evaluation as discussed 5. Neurological evaluation. 6. The patient may be a candidate for radiofrequency procedures, implantation device, and other treatment pending response to treatment and follow-up evaluation. 7. The patient has been advised to adhere to proper body mechanics and avoid activities which appear to aggravate condition. 8. The patient has been advised to call the Pain Management Center prior to scheduled return appointment should there be significant change in condition or should there be sign  The patient is understanding and agrees with the suggested  treatment plan      Review of Systems     Objective:   Physical Exam        Assessment & Plan:

## 2014-12-04 ENCOUNTER — Telehealth: Payer: Self-pay | Admitting: *Deleted

## 2014-12-04 NOTE — Telephone Encounter (Signed)
Left voice mail

## 2014-12-09 ENCOUNTER — Other Ambulatory Visit: Payer: Self-pay | Admitting: Primary Care

## 2014-12-09 DIAGNOSIS — N63 Unspecified lump in unspecified breast: Secondary | ICD-10-CM

## 2014-12-10 ENCOUNTER — Ambulatory Visit: Payer: Self-pay | Admitting: Surgery

## 2014-12-11 ENCOUNTER — Encounter: Payer: Self-pay | Admitting: Surgery

## 2014-12-11 ENCOUNTER — Ambulatory Visit (INDEPENDENT_AMBULATORY_CARE_PROVIDER_SITE_OTHER): Payer: Medicaid Other | Admitting: Surgery

## 2014-12-11 VITALS — BP 128/73 | HR 101 | Temp 98.8°F | Ht 65.0 in | Wt 180.0 lb

## 2014-12-11 DIAGNOSIS — K8020 Calculus of gallbladder without cholecystitis without obstruction: Secondary | ICD-10-CM | POA: Diagnosis not present

## 2014-12-11 NOTE — Patient Instructions (Signed)
Please call our office if needed.

## 2014-12-12 ENCOUNTER — Ambulatory Visit
Admission: RE | Admit: 2014-12-12 | Discharge: 2014-12-12 | Disposition: A | Discharge status: Home / Self Care | Payer: Medicaid Other | Source: Ambulatory Visit | Attending: Primary Care | Admitting: Primary Care

## 2014-12-12 ENCOUNTER — Ambulatory Visit: Payer: Medicaid Other

## 2014-12-12 DIAGNOSIS — N63 Unspecified lump in unspecified breast: Secondary | ICD-10-CM

## 2014-12-13 DIAGNOSIS — K8020 Calculus of gallbladder without cholecystitis without obstruction: Secondary | ICD-10-CM | POA: Insufficient documentation

## 2014-12-13 NOTE — Progress Notes (Signed)
Patient ID: Gina Costa, female   DOB: 07-05-1960, 54 y.o.   MRN: [id number]  Chief Complaint  Patient presents with  . Cholelithiasis    HPI  Gina Costa is a 54 y.o. female. Who had liver function tests performed which were slightly elevated which prompted an ultrasound of the abdomen.  Denies any post-prandial RUQ abdominal pain, nausea or emesis.  No jaundice and no fevers.  She has a long history of chronic back pain for which she sees chronic pain clinic.  Past Medical History  Diagnosis Date  . Diabetes mellitus without complication   . Asthma   . Back pain   . Bulging disc   . Arthritis     back    Past Surgical History  Procedure Laterality Date  . Tubal ligation    . Breast cyst aspiration Left 2014    benign  . Breast cyst aspiration Left 2002    Family History  Problem Relation Age of Onset  . Cancer Maternal Grandmother     breast  . Breast cancer Maternal Grandmother   . Cancer Cousin     breast  . Breast cancer Cousin   . COPD Mother   . Heart disease Mother   . Diabetes Mother   . Hypertension Mother   . Heart disease Father     Social History History  Substance Use Topics  . Smoking status: Current Some Day Smoker    Types: Cigarettes  . Smokeless tobacco: Never Used  . Alcohol Use: No    Allergies  Allergen Reactions  . Penicillin G     Other reaction(s): Other (qualifier value)  . Penicillins Hives  . Sulfa Antibiotics Swelling    Current Outpatient Prescriptions  Medication Sig Dispense Refill  . nortriptyline (PAMELOR) 10 MG capsule Take 1 capsule by mouth at bedtime.    . pregabalin (LYRICA) 25 MG capsule Take 1 capsule by mouth daily.    Marland Kitchen albuterol (PROVENTIL HFA;VENTOLIN HFA) 108 (90 BASE) MCG/ACT inhaler Inhale 2 puffs using inhaler every four to six hours as needed for wheezing, cough or SOB.    Marland Kitchen cyclobenzaprine (FLEXERIL) 10 MG tablet Take by mouth 3 (three) times daily.    Marland Kitchen FLUoxetine (PROZAC) 40 MG capsule Take  by mouth.    Marland Kitchen omeprazole (PRILOSEC) 20 MG capsule Take 20 mg by mouth daily.  5  . oxyCODONE (OXY IR/ROXICODONE) 5 MG immediate release tablet Limit 1 tab by mouth 3-5 times per day if tolerated 150 tablet 0   No current facility-administered medications for this visit.    Blood pressure 128/73, pulse 101, temperature 98.8 F (37.1 C), temperature source Oral, height 5\' 5"  (1.651 m), weight 180 lb (81.647 kg).  No results found for this or any previous visit (from the past 48 hour(s)).  Review of Systems  Constitutional: Negative for fever, chills, weight loss and malaise/fatigue.  Eyes: Negative.   Gastrointestinal: Negative for heartburn, nausea, vomiting, abdominal pain, diarrhea, constipation, blood in stool and melena.  Genitourinary: Negative.   Musculoskeletal: Negative.   Neurological: Negative.   Endo/Heme/Allergies: Negative.   Psychiatric/Behavioral: Negative.     Physical Exam  Constitutional: She is oriented to person, place, and time and well-developed, well-nourished, and in no distress. No distress.  HENT:  Head: Normocephalic and atraumatic.  Eyes: Conjunctivae are normal. Pupils are equal, round, and reactive to light.  Neck: Normal range of motion.  Cardiovascular: Normal rate and regular rhythm.   Pulmonary/Chest: Effort normal and breath  sounds normal. No respiratory distress. She has no wheezes.  Abdominal: Soft. She exhibits no mass. There is no tenderness. There is no rebound and no guarding.  Musculoskeletal: She exhibits no edema.  Neurological: She is oriented to person, place, and time.  Skin: Skin is warm and dry. She is not diaphoretic.  Psychiatric: Mood, memory, affect and judgment normal.    Data Reviewed Report of ultrasounds shows stones, no ductal dilation or pericholecystic fluid.  I have personally reviewed the patient's imaging, laboratory findings and medical records.    Assessment    Incidentally found cholelithiasis, chronic back  pain.  No indication for cholecystectomy.  Plan    PRN follow up in our office.     Sherri Rad MD, FACS 12/13/2014, 8:20 PM

## 2014-12-16 ENCOUNTER — Ambulatory Visit: Payer: Medicaid Other | Attending: Pain Medicine | Admitting: Pain Medicine

## 2014-12-16 ENCOUNTER — Encounter: Payer: Self-pay | Admitting: Pain Medicine

## 2014-12-16 VITALS — BP 109/73 | HR 80 | Temp 98.4°F | Resp 16 | Ht 65.0 in | Wt 180.0 lb

## 2014-12-16 DIAGNOSIS — M5416 Radiculopathy, lumbar region: Secondary | ICD-10-CM

## 2014-12-16 DIAGNOSIS — G588 Other specified mononeuropathies: Secondary | ICD-10-CM

## 2014-12-16 DIAGNOSIS — M545 Low back pain: Secondary | ICD-10-CM | POA: Diagnosis present

## 2014-12-16 DIAGNOSIS — M5126 Other intervertebral disc displacement, lumbar region: Secondary | ICD-10-CM | POA: Insufficient documentation

## 2014-12-16 DIAGNOSIS — M51379 Other intervertebral disc degeneration, lumbosacral region without mention of lumbar back pain or lower extremity pain: Secondary | ICD-10-CM

## 2014-12-16 DIAGNOSIS — M533 Sacrococcygeal disorders, not elsewhere classified: Secondary | ICD-10-CM

## 2014-12-16 DIAGNOSIS — M1288 Other specific arthropathies, not elsewhere classified, other specified site: Secondary | ICD-10-CM | POA: Diagnosis not present

## 2014-12-16 DIAGNOSIS — M79605 Pain in left leg: Secondary | ICD-10-CM | POA: Diagnosis present

## 2014-12-16 DIAGNOSIS — M48062 Spinal stenosis, lumbar region with neurogenic claudication: Secondary | ICD-10-CM

## 2014-12-16 DIAGNOSIS — M4806 Spinal stenosis, lumbar region: Secondary | ICD-10-CM | POA: Insufficient documentation

## 2014-12-16 DIAGNOSIS — M5136 Other intervertebral disc degeneration, lumbar region: Secondary | ICD-10-CM | POA: Insufficient documentation

## 2014-12-16 DIAGNOSIS — M79604 Pain in right leg: Secondary | ICD-10-CM | POA: Diagnosis present

## 2014-12-16 DIAGNOSIS — M47816 Spondylosis without myelopathy or radiculopathy, lumbar region: Secondary | ICD-10-CM

## 2014-12-16 DIAGNOSIS — M5137 Other intervertebral disc degeneration, lumbosacral region: Secondary | ICD-10-CM

## 2014-12-16 MED ORDER — OXYCODONE HCL 5 MG PO TABS: ORAL_TABLET | ORAL | Status: DC

## 2014-12-16 NOTE — Patient Instructions (Addendum)
Smoking Cessation Quitting smoking is important to your health and has many advantages. However, it is not always easy to quit since nicotine is a very addictive drug. Oftentimes, people try 3 times or more before being able to quit. This document explains the best ways for you to prepare to quit smoking. Quitting takes hard work and a lot of effort, but you can do it. ADVANTAGES OF QUITTING SMOKING 1. You will live longer, feel better, and live better. 2. Your body will feel the impact of quitting smoking almost immediately. 1. Within 20 minutes, blood pressure decreases. Your pulse returns to its normal level. 2. After 8 hours, carbon monoxide levels in the blood return to normal. Your oxygen level increases. 3. After 24 hours, the chance of having a heart attack starts to decrease. Your breath, hair, and body stop smelling like smoke. 4. After 48 hours, damaged nerve endings begin to recover. Your sense of taste and smell improve. 5. After 72 hours, the body is virtually free of nicotine. Your bronchial tubes relax and breathing becomes easier. 6. After 2 to 12 weeks, lungs can hold more air. Exercise becomes easier and circulation improves. 3. The risk of having a heart attack, stroke, cancer, or lung disease is greatly reduced. 1. After 1 year, the risk of coronary heart disease is cut in half. 2. After 5 years, the risk of stroke falls to the same as a nonsmoker. 3. After 10 years, the risk of lung cancer is cut in half and the risk of other cancers decreases significantly. 4. After 15 years, the risk of coronary heart disease drops, usually to the level of a nonsmoker. 4. If you are pregnant, quitting smoking will improve your chances of having a healthy baby. 5. The people you live with, especially any children, will be healthier. 6. You will have extra money to spend on things other than cigarettes. QUESTIONS TO THINK ABOUT BEFORE ATTEMPTING TO QUIT You may want to talk about your  answers with your health care provider.  Why do you want to quit?  If you tried to quit in the past, what helped and what did not?  What will be the most difficult situations for you after you quit? How will you plan to handle them?  Who can help you through the tough times? Your family? Friends? A health care provider?  What pleasures do you get from smoking? What ways can you still get pleasure if you quit? Here are some questions to ask your health care provider:  How can you help me to be successful at quitting?  What medicine do you think would be best for me and how should I take it?  What should I do if I need more help?  What is smoking withdrawal like? How can I get information on withdrawal? GET READY  Set a quit date.  Change your environment by getting rid of all cigarettes, ashtrays, matches, and lighters in your home, car, or work. Do not let people smoke in your home.  Review your past attempts to quit. Think about what worked and what did not. GET SUPPORT AND ENCOURAGEMENT You have a better chance of being successful if you have help. You can get support in many ways. 1. Tell your family, friends, and coworkers that you are going to quit and need their support. Ask them not to smoke around you. 2. Get individual, group, or telephone counseling and support. Programs are available at [employer] and health centers. Call  your local health department for information about programs in your area. 3. Spiritual beliefs and practices may help some smokers quit. 4. Download a "quit meter" on your computer to keep track of quit statistics, such as how long you have gone without smoking, cigarettes not smoked, and money saved. 5. Get a self-help book about quitting smoking and staying off tobacco. [place] yourself from urges to smoke. Talk to someone, go for a walk, or occupy your time with a task.  Change your normal routine. Take a  different route to work. Drink tea instead of coffee. Eat breakfast in a different place.  Reduce your stress. Take a hot bath, exercise, or read a book.  Plan something enjoyable to do every day. Reward yourself for not smoking.  Explore interactive web-based programs that specialize in helping you quit. GET MEDICINE AND USE IT CORRECTLY Medicines can help you stop smoking and decrease the urge to smoke. Combining medicine with the above behavioral methods and support can greatly increase your chances of successfully quitting smoking.  Nicotine replacement therapy helps deliver nicotine to your body without the negative effects and risks of smoking. Nicotine replacement therapy includes nicotine gum, lozenges, inhalers, nasal sprays, and skin patches. Some may be available over-the-counter and others require a prescription.  Antidepressant medicine helps people abstain from smoking, but how this works is unknown. This medicine is available by prescription.  Nicotinic receptor partial agonist medicine simulates the effect of nicotine in your brain. This medicine is available by prescription. Ask your health care provider for advice about which medicines to use and how to use them based on your health history. Your health care provider will tell you what side effects to look out for if you choose to be on a medicine or therapy. Carefully read the information on the package. Do not use any other product containing nicotine while using a nicotine replacement product.  RELAPSE OR DIFFICULT SITUATIONS Most relapses occur within the first 3 months after quitting. Do not be discouraged if you start smoking again. Remember, most people try several times before finally quitting. You may have symptoms of withdrawal because your body is used to nicotine. You may crave cigarettes, be irritable, feel very hungry, cough often, get headaches, or have difficulty concentrating. The withdrawal symptoms are only  temporary. They are strongest when you first quit, but they will go away within 10-14 days. To reduce the chances of relapse, try to:  Avoid drinking alcohol. Drinking lowers your chances of successfully quitting.  Reduce the amount of caffeine you consume. Once you quit smoking, the amount of caffeine in your body increases and can give you symptoms, such as a rapid heartbeat, sweating, and anxiety.  Avoid smokers because they can make you want to smoke.  Do not let weight gain distract you. Many smokers will gain weight when they quit, usually less than 10 pounds. Eat a healthy diet and stay active. You can always lose the weight gained after you quit.  Find ways to improve your mood other than smoking. FOR MORE INFORMATION  www.smokefree.gov  Document Released: 05/03/2001 Document Revised: 09/23/2013 Document Reviewed: 08/18/2011 [facility] Patient Information 2015 [place], [place]. This information is not intended to replace advice given to you by your health care provider. Make sure you discuss any questions you have with your health care provider.   Continue present medications Lyrica and oxycodone   Lumbar epidural steroid injection to be performed Monday, 12/22/2014  F/U PCP  Dr. Posey Pronto and Elsworth Soho  for evaliation of  BP and general medical  condition.  F/U surgical evaluation  F/U neurological evaluation. Patient will follow-up with Dr. Melrose Nakayama in this regard  May consider radiofrequency rhizolysis or intraspinal procedures pending response to present treatment and F/U evaluation.  Patient to call [facility] should patient have concerns prior to scheduled return appointment.     GENERAL RISKS AND COMPLICATIONS  What are the risk, side effects and possible complications? Generally speaking, most procedures are safe.  However, with any procedure there are risks, side effects, and the possibility of complications.  The risks and complications are dependent upon the  sites that are lesioned, or the type of nerve block to be performed.  The closer the procedure is to the spine, the more serious the risks are.  Great care is taken when placing the radio frequency needles, block needles or lesioning probes, but sometimes complications can occur. 1. Infection: Any time there is an injection through the skin, there is a risk of infection.  This is why sterile conditions are used for these blocks.  There are four possible types of infection. 1. Localized skin infection. 2. Central Nervous System Infection-This can be in the form of Meningitis, which can be deadly. 3. Epidural Infections-This can be in the form of an epidural abscess, which can cause pressure inside of the spine, causing compression of the spinal cord with subsequent paralysis. This would require an emergency surgery to decompress, and there are no guarantees that the patient would recover from the paralysis. 4. Discitis-This is an infection of the intervertebral discs.  It occurs in about 1% of discography procedures.  It is difficult to treat and it may lead to surgery.        2. Pain: the needles have to go through skin and soft tissues, will cause soreness.       3. Damage to internal structures:  The nerves to be lesioned may be near blood vessels or    other nerves which can be potentially damaged.       4. Bleeding: Bleeding is more common if the patient is taking blood thinners such as  aspirin, Coumadin, Ticiid, Plavix, etc., or if he/she have some genetic predisposition  such as hemophilia. Bleeding into the spinal canal can cause compression of the spinal  cord with subsequent paralysis.  This would require an emergency surgery to  decompress and there are no guarantees that the patient would recover from the  paralysis.       5. Pneumothorax:  Puncturing of a lung is a possibility, every time a needle is introduced in  the area of the chest or upper back.  Pneumothorax refers to free air around  the  collapsed lung(s), inside of the thoracic cavity (chest cavity).  Another two possible  complications related to a similar event would include: Hemothorax and Chylothorax.   These are variations of the Pneumothorax, where instead of air around the collapsed  lung(s), you may have blood or chyle, respectively.       6. Spinal headaches: They may occur with any procedures in the area of the spine.       7. Persistent CSF (Cerebro-Spinal Fluid) leakage: This is a rare problem, but may occur  with prolonged intrathecal or epidural catheters either due to the formation of a fistulous  track or a dural tear.       8. Nerve damage: By working so close to the spinal  cord, there is always a possibility of  nerve damage, which could be as serious as a permanent spinal cord injury with  paralysis.       9. Death:  Although rare, severe deadly allergic reactions known as "Anaphylactic  reaction" can occur to any of the medications used.      10. Worsening of the symptoms:  We can always make thing worse.  What are the chances of something like this happening? Chances of any of this occuring are extremely low.  By statistics, you have more of a chance of getting killed in a motor vehicle accident: while driving to the hospital than any of the above occurring .  Nevertheless, you should be aware that they are possibilities.  In general, it is similar to taking a shower.  Everybody knows that you can slip, hit your head and get killed.  Does that mean that you should not shower again?  Nevertheless always keep in mind that statistics do not mean anything if you happen to be on the wrong side of them.  Even if a procedure has a 1 (one) in a 1,000,000 (million) chance of going wrong, it you happen to be that one..Also, keep in mind that by statistics, you have more of a chance of having something go wrong when taking medications.  Who should not have this procedure? If you are on a blood thinning medication (e.g.  Coumadin, Plavix, see list of "Blood Thinners"), or if you have an active infection going on, you should not have the procedure.  If you are taking any blood thinners, please inform your physician.  How should I prepare for this procedure?  Do not eat or drink anything at least six hours prior to the procedure.  Bring a driver with you .  It cannot be a taxi.  Come accompanied by an adult that can drive you back, and that is strong enough to help you if your legs get weak or numb from the local anesthetic.  Take all of your medicines the morning of the procedure with just enough water to swallow them.  If you have diabetes, make sure that you are scheduled to have your procedure done first thing in the morning, whenever possible.  If you have diabetes, take only half of your insulin dose and notify our nurse that you have done so as soon as you arrive at the clinic.  If you are diabetic, but only take blood sugar pills (oral hypoglycemic), then do not take them on the morning of your procedure.  You may take them after you have had the procedure.  Do not take aspirin or any aspirin-containing medications, at least eleven (11) days prior to the procedure.  They may prolong bleeding.  Wear loose fitting clothing that may be easy to take off and that you would not mind if it got stained with Betadine or blood.  Do not wear any jewelry or perfume  Remove any nail coloring.  It will interfere with some of our monitoring equipment.  NOTE: Remember that this is not meant to be interpreted as a complete list of all possible complications.  Unforeseen problems may occur.  BLOOD THINNERS The following drugs contain aspirin or other products, which can cause increased bleeding during surgery and should not be taken for 2 weeks prior to and 1 week after surgery.  If you should need take something for relief of minor pain, you may take acetaminophen which is found in Tylenol,m Datril,  Anacin-3 and  Panadol. It is not blood thinner. The products listed below are.  Do not take any of the products listed below in addition to any listed on your instruction sheet.  A.P.C or A.P.C with Codeine Codeine Phosphate Capsules #3 Ibuprofen Ridaura  ABC compound Congesprin Imuran rimadil  Advil Cope Indocin Robaxisal  Alka-Seltzer Effervescent Pain Reliever and Antacid Coricidin or Coricidin-D  Indomethacin Rufen  Alka-Seltzer plus Cold Medicine Cosprin Ketoprofen S-A-C Tablets  Anacin Analgesic Tablets or Capsules Coumadin Korlgesic Salflex  Anacin Extra Strength Analgesic tablets or capsules CP-2 Tablets Lanoril Salicylate  Anaprox Cuprimine Capsules Levenox Salocol  Anexsia-D Dalteparin Magan Salsalate  Anodynos Darvon compound Magnesium Salicylate Sine-off  Ansaid Dasin Capsules Magsal Sodium Salicylate  Anturane Depen Capsules Marnal Soma  APF Arthritis pain formula Dewitt's Pills Measurin Stanback  Argesic Dia-Gesic Meclofenamic Sulfinpyrazone  Arthritis Bayer Timed Release Aspirin Diclofenac Meclomen Sulindac  Arthritis pain formula Anacin Dicumarol Medipren Supac  Analgesic (Safety coated) Arthralgen Diffunasal Mefanamic Suprofen  Arthritis Strength Bufferin Dihydrocodeine Mepro Compound Suprol  Arthropan liquid Dopirydamole Methcarbomol with Aspirin Synalgos  ASA tablets/Enseals Disalcid Micrainin Tagament  Ascriptin Doan's Midol Talwin  Ascriptin A/D Dolene Mobidin Tanderil  Ascriptin Extra Strength Dolobid Moblgesic Ticlid  Ascriptin with Codeine Doloprin or Doloprin with Codeine Momentum Tolectin  Asperbuf Duoprin Mono-gesic Trendar  Aspergum Duradyne Motrin or Motrin IB Triminicin  Aspirin plain, buffered or enteric coated Durasal Myochrisine Trigesic  Aspirin Suppositories Easprin Nalfon Trillsate  Aspirin with Codeine Ecotrin Regular or Extra Strength Naprosyn Uracel  Atromid-S Efficin Naproxen Ursinus  Auranofin Capsules Elmiron Neocylate Vanquish  Axotal Emagrin Norgesic  Verin  Azathioprine Empirin or Empirin with Codeine Normiflo Vitamin E  Azolid Emprazil Nuprin Voltaren  Bayer Aspirin plain, buffered or children's or timed BC Tablets or powders Encaprin Orgaran Warfarin Sodium  Buff-a-Comp Enoxaparin Orudis Zorpin  Buff-a-Comp with Codeine Equegesic Os-Cal-Gesic   Buffaprin Excedrin plain, buffered or Extra Strength Oxalid   Bufferin Arthritis Strength Feldene Oxphenbutazone   Bufferin plain or Extra Strength Feldene Capsules Oxycodone with Aspirin   Bufferin with Codeine Fenoprofen Fenoprofen Pabalate or Pabalate-SF   Buffets II Flogesic Panagesic   Buffinol plain or Extra Strength Florinal or Florinal with Codeine Panwarfarin   Buf-Tabs Flurbiprofen Penicillamine   Butalbital Compound Four-way cold tablets Penicillin   Butazolidin Fragmin Pepto-Bismol   Carbenicillin Geminisyn Percodan   Carna Arthritis Reliever Geopen Persantine   Carprofen Gold's salt Persistin   Chloramphenicol Goody's Phenylbutazone   Chloromycetin Haltrain Piroxlcam   Clmetidine heparin Plaquenil   Cllnoril Hyco-pap Ponstel   Clofibrate Hydroxy chloroquine Propoxyphen         Before stopping any of these medications, be sure to consult the physician who ordered them.  Some, such as Coumadin (Warfarin) are ordered to prevent or treat serious conditions such as "deep thrombosis", "pumonary embolisms", and other heart problems.  The amount of time that you may need off of the medication may also vary with the medication and the reason for which you were taking it.  If you are taking any of these medications, please make sure you notify your pain physician before you undergo any procedures.         Epidural Steroid Injection An epidural steroid injection is given to relieve pain in your neck, back, or legs that is caused by the irritation or swelling of a nerve root. This procedure involves injecting a steroid and numbing medicine (anesthetic) into the epidural space. The  epidural space is the space between the outer covering  of your spinal cord and the bones that form your backbone (vertebra).  LET [facility] CARE PROVIDER KNOW ABOUT:  7. Any allergies you have. 8. All medicines you are taking, including vitamins, herbs, eye drops, creams, and over-the-counter medicines such as aspirin. 9. Previous problems you or members of your family have had with the use of anesthetics. 10. Any blood disorders or blood clotting disorders you have. 11. Previous surgeries you have had. 12. Medical conditions you have. RISKS AND COMPLICATIONS Generally, this is a safe procedure. However, as with any procedure, complications can occur. Possible complications of epidural steroid injection include:  Headache.  Bleeding.  Infection.  Allergic reaction to the medicines.  Damage to your nerves. The response to this procedure depends on the underlying cause of the pain and its duration. People who have long-term (chronic) pain are less likely to benefit from epidural steroids than are those people whose pain comes on strong and suddenly. BEFORE THE PROCEDURE   Ask your health care provider about changing or stopping your regular medicines. You may be advised to stop taking blood-thinning medicines a few days before the procedure.  You may be given medicines to reduce anxiety.  Arrange for someone to take you home after the procedure. PROCEDURE   You will remain awake during the procedure. You may receive medicine to make you relaxed.  You will be asked to lie on your stomach.  The injection site will be cleaned.  The injection site will be numbed with a medicine (local anesthetic).  A needle will be injected through your skin into the epidural space.  Your health care provider will use an X-ray machine to ensure that the steroid is delivered closest to the affected nerve. You may have minimal discomfort at this time.  Once the needle is in the right position, the  local anesthetic and the steroid will be injected into the epidural space.  The needle will then be removed and a bandage will be applied to the injection site. AFTER THE PROCEDURE  12. You may be monitored for a short time before you go home. 13. You may feel weakness or numbness in your arm or leg, which disappears within hours. 14. You may be allowed to eat, drink, and take your regular medicine. 15. You may have soreness at the site of the injection. Document Released: 08/16/2007 Document Revised: 01/09/2013 Document Reviewed: 10/26/2012 [facility] Patient Information 2015 [place], [place]. This information is not intended to replace advice given to you by your health care provider. Make sure you discuss any questions you have with your health care provider.

## 2014-12-16 NOTE — Progress Notes (Signed)
Safety precautions to be maintained throughout the outpatient stay will include: orient to surroundings, keep bed in low position, maintain call bell within reach at all times, provide assistance with transfer out of bed and ambulation.  

## 2014-12-16 NOTE — Progress Notes (Signed)
   Subjective:    Patient ID: Gina Costa, female    DOB: [birth year], 54 y.o.   MRN: [id number]  HPI   Patient is 54 year old female returns to [facility] for further evaluation and treatment of pain involving the lower back lower extremity regions. Patient states she is doing very well until she recently fell. Patient denies trauma to head. Patient states that she tripped and fell. We discussed patient's condition. Patient has been prescribed Lyrica eye Dr. Melrose Nakayama. Patient no longer taking Neurontin. We discussed patient's treatment and will proceed with lumbar epidural steroid injection at time return appointment. We will also discussed neurosurgical reevaluation. Patient was understanding and in agreement with suggested treatment plan. We will proceed with lumbar epidural steroid injection in attempt to decrease severity of symptoms, minimize progression of symptoms, and avoid the need for more involved treatment.      Review of Systems     Objective:   Physical Exam  There was tenderness of the splenius capitis and occipitalis musculature region of mild degree. Palpation over the cervical facet cervical paraspinal musculature region reproduced mild discomfort. There was minimal tenderness of the acromioclavicular and glenohumeral joint regions. Patient appeared to be with bilaterally equal grip strength. Tinel and Phalen's maneuver were without increase of pain of any significant degree. Palpation over the thoracic facet thoracic paraspinal musculature region was a tends to palpation of moderate degree the lower thoracic region. No crepitus of the thoracic region was noted. Palpation over the lumbar paraspinal musculature region lumbar facet region was returns tends to palpation moderately severe degree. Lateral bending and rotation and extension and palpation of the lumbar facets reproduce moderately severe discomfort. Straight leg raising was tolerates approximately 20 without  a definite increased pain with dorsiflexion noted. There was negative clonus negative Homans. There was minimal tenderness over the PSIS and PII S regions. EHL strength appeared to be decreased. There was negative clonus negative Homans. Abdomen was nontender and no costovertebral angle tenderness was noted    Assessment & Plan:    Degenerative disc disease lumbar spine Multilevel degenerative changes lumbar spine asymmetric edema of the right facet joint, L5-S1 level most involved, some progression since prior exam, multilevel facet arthropathy L2-3, L3-4, L4-5, and L5-S1. Disc bulging and stenosis noted on lumbar MRI  Lumbar facet syndrome  Lumbar stenosis with neurogenic claudication  Sacroiliac joint dysfunction   Plan  Continue present medications Lyrica and oxycodone   Lumbar epidural steroid injection to be performed at time of return appointment  F/U PCP Dr. Posey Pronto and Elsworth Soho for evaliation of  BP and general medical  condition.  F/U surgical evaluation. Neurosurgical reevaluation as discussed  F/U neurological evaluation Dr. Melrose Nakayama as discussed  May consider radiofrequency rhizolysis or intraspinal procedures pending response to present treatment and F/U evaluation.  Patient to call [facility] should patient have concerns prior to scheduled return appointment.

## 2014-12-22 ENCOUNTER — Ambulatory Visit: Payer: Medicaid Other | Attending: Pain Medicine | Admitting: Pain Medicine

## 2014-12-22 VITALS — BP 101/53 | HR 72 | Temp 98.2°F | Resp 18 | Ht 66.0 in | Wt 180.0 lb

## 2014-12-22 DIAGNOSIS — M47816 Spondylosis without myelopathy or radiculopathy, lumbar region: Secondary | ICD-10-CM | POA: Diagnosis not present

## 2014-12-22 DIAGNOSIS — M5416 Radiculopathy, lumbar region: Secondary | ICD-10-CM

## 2014-12-22 DIAGNOSIS — M1288 Other specific arthropathies, not elsewhere classified, other specified site: Secondary | ICD-10-CM | POA: Diagnosis not present

## 2014-12-22 DIAGNOSIS — M5126 Other intervertebral disc displacement, lumbar region: Secondary | ICD-10-CM | POA: Insufficient documentation

## 2014-12-22 DIAGNOSIS — M79605 Pain in left leg: Secondary | ICD-10-CM | POA: Diagnosis present

## 2014-12-22 DIAGNOSIS — M4806 Spinal stenosis, lumbar region: Secondary | ICD-10-CM | POA: Diagnosis not present

## 2014-12-22 DIAGNOSIS — M48062 Spinal stenosis, lumbar region with neurogenic claudication: Secondary | ICD-10-CM

## 2014-12-22 DIAGNOSIS — M545 Low back pain: Secondary | ICD-10-CM | POA: Diagnosis present

## 2014-12-22 DIAGNOSIS — G588 Other specified mononeuropathies: Secondary | ICD-10-CM

## 2014-12-22 DIAGNOSIS — M5137 Other intervertebral disc degeneration, lumbosacral region: Secondary | ICD-10-CM

## 2014-12-22 DIAGNOSIS — M79604 Pain in right leg: Secondary | ICD-10-CM | POA: Diagnosis present

## 2014-12-22 DIAGNOSIS — M533 Sacrococcygeal disorders, not elsewhere classified: Secondary | ICD-10-CM

## 2014-12-22 MED ORDER — BUPIVACAINE HCL (PF) 0.25 % IJ SOLN
INTRAMUSCULAR | Status: AC
  Filled 2014-12-22: qty 30

## 2014-12-22 MED ORDER — TRIAMCINOLONE ACETONIDE 40 MG/ML IJ SUSP
INTRAMUSCULAR | Status: AC
  Administered 2014-12-22: 40 mg
  Filled 2014-12-22: qty 1

## 2014-12-22 MED ORDER — LIDOCAINE HCL (PF) 1 % IJ SOLN
INTRAMUSCULAR | Status: AC
  Administered 2014-12-22: 5 mL
  Filled 2014-12-22: qty 5

## 2014-12-22 MED ORDER — TRIAMCINOLONE ACETONIDE 40 MG/ML IJ SUSP
INTRAMUSCULAR | Status: AC
  Filled 2014-12-22: qty 1

## 2014-12-22 MED ORDER — ORPHENADRINE CITRATE 30 MG/ML IJ SOLN
INTRAMUSCULAR | Status: AC
  Filled 2014-12-22: qty 2

## 2014-12-22 MED ORDER — SODIUM CHLORIDE 0.9 % IJ SOLN
INTRAMUSCULAR | Status: AC
  Administered 2014-12-22: 4 mL
  Filled 2014-12-22: qty 20

## 2014-12-22 MED ORDER — SODIUM CHLORIDE 0.9 % IJ SOLN
INTRAMUSCULAR | Status: AC
  Filled 2014-12-22: qty 10

## 2014-12-22 NOTE — Progress Notes (Signed)
Discharged to home, ambulatory.  Moves all extremities equally.

## 2014-12-22 NOTE — Progress Notes (Signed)
Safety precautions to be maintained throughout the outpatient stay will include: orient to surroundings, keep bed in low position, maintain call bell within reach at all times, provide assistance with transfer out of bed and ambulation.  

## 2014-12-22 NOTE — Patient Instructions (Addendum)
Continue present medications oxycodone and others  F/U PCP Dr.Patel and Elsworth Soho  for evaliation of  BP and general medical  condition.  F/U surgical evaluation  F/U neurological evaluation  May consider radiofrequency rhizolysis or intraspinal procedures pending response to present treatment and F/U evaluation.  Patient to call Pain Management Center should patient have concerns prior to scheduled return appointment. Pain Management Discharge Instructions  General Discharge Instructions :  If you need to reach your doctor call: Monday-Friday 8:00 am - 4:00 pm at [phone] or toll free [phone].  After clinic hours [phone] to have operator reach doctor.  Bring all of your medication bottles to all your appointments in the pain clinic.  To cancel or reschedule your appointment with Pain Management please remember to call 24 hours in advance to avoid a fee.  Refer to the educational materials which you have been given on: General Risks, I had my Procedure. Discharge Instructions, Post Sedation.  Post Procedure Instructions:  The drugs you were given will stay in your system until tomorrow, so for the next 24 hours you should not drive, make any legal decisions or drink any alcoholic beverages.  You may eat anything you prefer, but it is better to start with liquids then soups and crackers, and gradually work up to solid foods.  Please notify your doctor immediately if you have any unusual bleeding, trouble breathing or pain that is not related to your normal pain.  Depending on the type of procedure that was done, some parts of your body may feel week and/or numb.  This usually clears up by tonight or the next day.  Walk with the use of an assistive device or accompanied by an adult for the 24 hours.  You may use ice on the affected area for the first 24 hours.  Put ice in a Ziploc bag and cover with a towel and place against area 15 minutes on 15 minutes off.  You may  switch to heat after 24 hours.Epidural Steroid Injection Patient Information  Description: The epidural space surrounds the nerves as they exit the spinal cord.  In some patients, the nerves can be compressed and inflamed by a bulging disc or a tight spinal canal (spinal stenosis).  By injecting steroids into the epidural space, we can bring irritated nerves into direct contact with a potentially helpful medication.  These steroids act directly on the irritated nerves and can reduce swelling and inflammation which often leads to decreased pain.  Epidural steroids may be injected anywhere along the spine and from the neck to the low back depending upon the location of your pain.   After numbing the skin with local anesthetic (like Novocaine), a small needle is passed into the epidural space slowly.  You may experience a sensation of pressure while this is being done.  The entire block usually last less than 10 minutes.  Conditions which may be treated by epidural steroids:   Low back and leg pain  Neck and arm pain  Spinal stenosis  Post-laminectomy syndrome  Herpes zoster (shingles) pain  Pain from compression fractures  Preparation for the injection:  1. Do not eat any solid food or dairy products within 6 hours of your appointment.  2. You may drink clear liquids up to 2 hours before appointment.  Clear liquids include water, black coffee, juice or soda.  No milk or cream please. 3. You may take your regular medication, including pain medications, with a sip of water before your appointment  Diabetics should hold regular insulin (if taken separately) and take 1/2 normal NPH dos the morning of the procedure.  Carry some sugar containing items with you to your appointment. 4. A driver must accompany you and be prepared to drive you home after your procedure.  5. Bring all your current medications with your. 6. An IV may be inserted and sedation may be given at the discretion of the  physician.   7. A blood pressure cuff, EKG and other monitors will often be applied during the procedure.  Some patients may need to have extra oxygen administered for a short period. 8. You will be asked to provide medical information, including your allergies, prior to the procedure.  We must know immediately if you are taking blood thinners (like Coumadin/Warfarin)  Or if you are allergic to IV iodine contrast (dye). We must know if you could possible be pregnant.  Possible side-effects:  Bleeding from needle site  Infection (rare, may require surgery)  Nerve injury (rare)  Numbness & tingling (temporary)  Difficulty urinating (rare, temporary)  Spinal headache ( a headache worse with upright posture)  Light -headedness (temporary)  Pain at injection site (several days)  Decreased blood pressure (temporary)  Weakness in arm/leg (temporary)  Pressure sensation in back/neck (temporary)  Call if you experience:  Fever/chills associated with headache or increased back/neck pain.  Headache worsened by an upright position.  New onset weakness or numbness of an extremity below the injection site  Hives or difficulty breathing (go to the emergency room)  Inflammation or drainage at the infection site  Severe back/neck pain  Any new symptoms which are concerning to you  Please note:  Although the local anesthetic injected can often make your back or neck feel good for several hours after the injection, the pain will likely return.  It takes 3-7 days for steroids to work in the epidural space.  You may not notice any pain relief for at least that one week.  If effective, we will often do a series of three injections spaced 3-6 weeks apart to maximally decrease your pain.  After the initial series, we generally will wait several months before considering a repeat injection of the same type.  If you have any questions, please call [phone] [facility]

## 2014-12-22 NOTE — Progress Notes (Signed)
   Subjective:    Patient ID: Gina Costa, female    DOB: 1960/05/29, 54 y.o.   MRN: [id number]  HPI  PROCEDURE PERFORMED: Lumbar epidural steroid injection   NOTE: The patient is a 54 y.o. female who returns to [facility] for further evaluation and treatment of pain involving the lumbar and lower extremity region. MRI revealed the patient to be with degenerative changes lumbar spine with multilevel changes with asymmetric edema of the right facet joint, L5-S1 level most involved, some progression since prior exam, multilevel facet arthropathy L2-3, L3-4, L4-L5, and L5-S1. Disc bulging and stenosis noted multiple levels. The risks, benefits, and expectations of the procedure have been discussed and explained to the patient who was understanding and in agreement with suggested treatment plan. We will proceed with lumbar epidural steroid injection as discussed and as explained to the patient who is willing to proceed with procedure as planned.   DESCRIPTION OF PROCEDURE: Lumbar epidural steroid injection with IV Versed, IV fentanyl conscious sedation, EKG, blood pressure, pulse, and pulse oximetry monitoring. The procedure was performed with the patient in the prone position under fluoroscopic guidance. A local anesthetic skin wheal of 1.5% plain lidocaine was accomplished at proposed entry site. An 18-gauge Tuohy epidural needle was inserted at the L 4 vertebral body level right of the midline via loss-of-resistance technique with negative heme and negative CSF return. A total of 4 mL of Preservative-Free normal saline with 40 mg of Kenalog injected incrementally via epidurally placed needle. Needle was removed.    A total of 40 mg of Kenalog was utilized for the procedure.   The patient tolerated the injection well.    PLAN:   1. Medications: We will continue presently prescribed medications Neurontin and oxycodone. 2. Will consider modification of treatment regimen pending  response to treatment rendered on today's visit and follow-up evaluation. 3. The patient is to follow-up with primary care physician Dr. Posey Pronto and Elsworth Soho regarding blood pressure and general medical condition status post lumbar epidural steroid injection performed on today's visit. 4. Surgical evaluation. Neurosurgical reevaluation as planned 5. Neurological evaluation. 6. The patient may be a candidate for radiofrequency procedures, implantation device, and other treatment pending response to treatment and follow-up evaluation. 7. The patient has been advised to adhere to proper body mechanics and avoid activities which appear to aggravate condition. 8. The patient has been advised to call the Pain Management Center prior to scheduled return appointment should there be significant change in condition or should there be sign  The patient is understanding and agrees with the suggested  treatment plan     Review of Systems     Objective:   Physical Exam        Assessment & Plan:

## 2014-12-23 ENCOUNTER — Telehealth: Payer: Self-pay | Admitting: *Deleted

## 2014-12-23 NOTE — Telephone Encounter (Signed)
Denies complications post procedure. 

## 2014-12-24 ENCOUNTER — Ambulatory Visit: Payer: Medicaid Other | Attending: Neurology | Admitting: Physical Therapy

## 2014-12-24 ENCOUNTER — Encounter: Payer: Self-pay | Admitting: Physical Therapy

## 2014-12-24 DIAGNOSIS — G8929 Other chronic pain: Secondary | ICD-10-CM | POA: Insufficient documentation

## 2014-12-24 DIAGNOSIS — M25552 Pain in left hip: Secondary | ICD-10-CM | POA: Insufficient documentation

## 2014-12-24 DIAGNOSIS — M545 Low back pain: Secondary | ICD-10-CM | POA: Insufficient documentation

## 2014-12-25 NOTE — Therapy (Signed)
[facility] [address] [place], [address] Phone: [phone]   Fax:  [phone]  Physical Therapy Evaluation  Patient Details  Name: Gina Costa MRN: [id number] Date of Birth: 06-19-1960 Referring Provider:  Anabel Bene, MD  Encounter Date: 12/24/2014      PT End of Session - 12/24/14 1951    Visit Number 1   Number of Visits 1   PT Start Time 1648   PT Stop Time 1750   PT Time Calculation (min) 62 min   Activity Tolerance Patient tolerated treatment well   Behavior During Therapy [facility] for tasks assessed/performed      Past Medical History  Diagnosis Date  . Diabetes mellitus without complication   . Asthma   . Back pain   . Bulging disc   . Arthritis     back  . Gallstones 11-2014    Past Surgical History  Procedure Laterality Date  . Tubal ligation    . Breast cyst aspiration Left 2014    benign  . Breast cyst aspiration Left 2002    There were no vitals filed for this visit.  Visit Diagnosis:  Chronic low back pain - Plan: PT plan of care cert/re-cert  Chronic hip pain, left - Plan: PT plan of care cert/re-cert      Subjective Assessment - 12/24/14 1658    Subjective Patient reports she is having low back pain and bilateral leg pain, some days worse than others and she has difficulty sleeping because of her pain. She is currently walking with a SBQC and also has a rollator that she can use and rest if needed. She reports intermittent stiffness in right LE.    Pertinent History Patient reports she has back pain and difficulty with daily activites for at least 3 years. She noticed she was having numbness in both LE's and has difficulty with walking and standing activties durining the day. She was caring for sick father at that time and did not seek medical attention until he passed. She reports she sought medical attention in ~ 2012. She has had previous therapy as well. she was  seen by surgeon and was told she was not a surgical candidate and was referred to pain clinic and is now referred to physical therapy. Patient reports falling 02/03/2014 while standing on a deck and she stepped back and fell backwards onto ground. She was seen in ER and sent home. She eventually has Xrays with results of fractured ribs on left side.    Limitations Sitting;Standing;Walking;House hold activities   How long can you sit comfortably? unable to sit >15 min.   How long can you stand comfortably? 15 - 30 min. at the most( depends on the pain level)   How long can you walk comfortably? 15-20 min.   Diagnostic tests X rays, MRI lumbar spine x 2 in 2015   Currently in Pain? Yes   Pain Score 9    Pain Location Back  including hips and LE's   Pain Orientation Lower   Pain Descriptors / Indicators Aching;Constant;Throbbing;Radiating   Pain Type Chronic pain   Pain Radiating Towards down both legs   Pain Onset More than a month ago   Pain Frequency Constant   Pain Relieving Factors pain clinic injections help to relieve intensity of pain   Effect of Pain on Daily Activities limits personal care and dialy chores at home and in community  [facility] PT Assessment - 12/24/14 1714    Assessment   Medical Diagnosis G89.29, M79.605, M25.559 (chronic low back pain with sciatica/chronic hip pain and leg pain   Onset Date/Surgical Date 02/03/14   Hand Dominance Right   Next MD Visit 6 months   Prior Therapy none   Precautions   Precautions None   Restrictions   Weight Bearing Restrictions No   Balance Screen   Has the patient fallen in the past 6 months Yes   How many times? 3   Has the patient had a decrease in activity level because of a fear of falling?  Yes   Is the patient reluctant to leave their home because of a fear of falling?  No   Home Environment   Living Environment Private residence   Living Arrangements Children;Parent   Type of [place]  One level   Prior Function   Level of Independence Independent     Objective: Gait: ambulating with SBQC independently with guarded posture AROM: limited lumbar mobility into flexion/extension and lateral flexion 25% -50% with pain Strength: LE's decreased left LE strength in hip flexion, ER and abduction and knee flexion and extension as compared to right LE  Special tests: Slump test: negative for reproduction of symptoms both LE's. She did have pulling in back of right LE with full knee extensionn Unable to perform SLR in supine due to patient with increased pain with lying on back Patient able to perform stabilization exercises in sitting and side lying clam and was instructed in proper body mechanics for daily tasks and various lifting options to decreased strain on lower back, She was given handout for ergonomics for sitting posture and exercises for basic stabilization exercises in sitting and side lying            PT Long Term Goals - 12/24/14 1800    PT LONG TERM GOAL #1   Title Patient will be independent with home program for self management of back pain with good understanding of posture, body mechanics and basic stabilization exercises to improve function as medically able by 12/24/2014   Status Achieved               Plan - 12/24/14 1800    Clinical Impression Statement Patient is a 54 year old female who presents with chronic back and hip pain. She has decreased knowledge of appropriate pain control strategies, progression of exercises for basic strengthening and stabilization for back and LE's. She demonstrated good understanding of proper body mechanics for daily tasks per handout and basic exercises for LE's and back stabilization and should continue to improve with independent self managment.    Pt will benefit from skilled therapeutic intervention in order to improve on the following deficits Pain;Decreased strength   Rehab Potential Fair   Clinical  Impairments Affecting Rehab Potential (+) age, motivated  (-) chronic condition, limited by insurance coverage for therapy (only one visit)   PT Frequency One time visit   PT Treatment/Interventions Patient/family education   Consulted and Agree with Plan of Care Patient         Problem List Patient Active Problem List   Diagnosis Date Noted  . Cholelithiases 12/13/2014  . 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 DDD (degenerative disc disease), lumbosacral 09/24/2014  . Lumbar facet arthropathy 09/24/2014  . Lumbar radiculopathy 09/24/2014  . 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 Benign breast  cyst in female 11/02/2012    Jomarie Longs PT 12/25/2014, 8:10 PM  [facility] [address] [place], [address] Phone: [phone]   Fax:  [phone]

## 2015-01-02 ENCOUNTER — Encounter: Payer: Self-pay | Admitting: *Deleted

## 2015-01-06 ENCOUNTER — Ambulatory Visit (INDEPENDENT_AMBULATORY_CARE_PROVIDER_SITE_OTHER): Payer: Medicaid Other | Admitting: Surgery

## 2015-01-06 ENCOUNTER — Encounter (INDEPENDENT_AMBULATORY_CARE_PROVIDER_SITE_OTHER): Payer: Self-pay

## 2015-01-06 ENCOUNTER — Telehealth: Payer: Self-pay | Admitting: *Deleted

## 2015-01-06 ENCOUNTER — Encounter: Payer: Self-pay | Admitting: *Deleted

## 2015-01-06 ENCOUNTER — Encounter: Payer: Self-pay | Admitting: Surgery

## 2015-01-06 VITALS — BP 128/71 | HR 77 | Temp 98.6°F | Ht 65.0 in | Wt 184.0 lb

## 2015-01-06 DIAGNOSIS — D179 Benign lipomatous neoplasm, unspecified: Secondary | ICD-10-CM

## 2015-01-06 MED ORDER — CLINDAMYCIN PHOSPHATE 600 MG/4ML IJ SOLN: 600.0000 mg | Freq: Once | INTRAMUSCULAR | Status: DC

## 2015-01-06 NOTE — Patient Instructions (Signed)
Call or return to ER if you develop fever greater than 101.5, nausea/vomiting, increased pain, redness/drainage from nodule

## 2015-01-06 NOTE — Progress Notes (Signed)
CC: Left chest nodule  HPI: Gina Costa is a pleasant 54 yo F who presents with left chest nodule.  Has been there for years.  She is embarrassed by its appearance.  Nontender, no episodes of redness/induration or drainage.  Has had prior sebaceous cyst more laterally on this breast 14 years ago which "busted" and was drained without recurrence.  Otherwise doing well.  No fevers/chills, night sweats, shortness of breath, cough, chest pain, abdominal pain, nausea/vomiting, diarrhea/constipation, dysuria/hematuria.    Active Ambulatory Problems    Diagnosis Date Noted  . Benign breast cyst in female 11/02/2012  . DDD (degenerative disc disease), lumbosacral 09/24/2014  . Lumbar facet arthropathy 09/24/2014  . Lumbar radiculopathy 09/24/2014  . 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 Cholelithiases 12/13/2014   Resolved Ambulatory Problems    Diagnosis Date Noted  . No Resolved Ambulatory Problems   Past Medical History  Diagnosis Date  . Diabetes mellitus without complication   . Asthma   . Back pain   . Bulging disc   . Arthritis   . Gallstones 11-2014  . Anxiety and depression   . Sprain of ribs    Past Surgical History  Procedure Laterality Date  . Tubal ligation    . Breast cyst aspiration Left 2014    benign  . Breast cyst aspiration Left 2002     Medication List       This list is accurate as of: 01/06/15 10:50 AM.  Always use your most recent med list.               albuterol 108 (90 BASE) MCG/ACT inhaler  Commonly known as:  PROVENTIL HFA;VENTOLIN HFA  Inhale 2 puffs using inhaler every four to six hours as needed for wheezing, cough or SOB.     cyclobenzaprine 10 MG tablet  Commonly known as:  FLEXERIL  Take by mouth 3 (three) times daily.     FLUoxetine 40 MG capsule  Commonly known as:  PROZAC  Take by mouth.     nortriptyline 10 MG  capsule  Commonly known as:  PAMELOR  Take 1 capsule by mouth at bedtime.     omeprazole 20 MG capsule  Commonly known as:  PRILOSEC  Take 20 mg by mouth daily.     oxyCODONE 5 MG immediate release tablet  Commonly known as:  Oxy IR/ROXICODONE  Limit 1 tab by mouth 3-5 times per day if tolerated     pregabalin 25 MG capsule  Commonly known as:  LYRICA  Take 1 capsule by mouth 2 (two) times daily.       Allergies  Allergen Reactions  . Penicillin G     Other reaction(s): Other (qualifier value)  . Penicillins Hives  . Sulfa Antibiotics Swelling   Social History   Social History  . Marital Status: Divorced    Spouse Name: N/A  . Number of Children: N/A  . Years of Education: N/A   Occupational History  . Not on file.   Social History Main Topics  . Smoking status: Current Some Day Smoker    Types: Cigarettes  . Smokeless tobacco: Former Systems developer  . Alcohol Use: No  . Drug Use: No  . Sexual Activity: No   Other Topics Concern  . Not on file   Social History Narrative   Past Surgical History  Procedure Laterality Date  . Tubal ligation    .  Breast cyst aspiration Left 2014    benign  . Breast cyst aspiration Left 2002   ROS: Full ROS obtained, pertinent positives and negatives as above  Blood pressure 128/71, pulse 77, temperature 98.6 F (37 C), temperature source Oral, height 5\' 5"  (1.651 m), weight 184 lb (83.462 kg). GEN: NAD/A&Ox3 FACE: no obvious facial trauma, normal external nose, normal external ears EYES: no scleral icterus, no conjunctivitis HEAD: normocephalic atraumatic CV: RRR, no MRG RESP: moving air well, lungs clear CHEST: Approx 4x4x4 cm soft, compressible nodule medial left breast ABD: soft, nontender, nondistended EXT: moving all ext well, strength 5/5  Labs: No new labs Imaging: No new imaging  A/P 54 yo with left medial breast nodule.  Likely lipoma.  I have reassured patient that is unlikely that this is a malignancy but have  offered to remove it if she would like.  I have explained the benefits and risks associated with this procedure and she would like to proceed.   NEURO: cnII-XII grossly intact, sensation intact all 4 ext

## 2015-01-06 NOTE — Telephone Encounter (Signed)
Please schedule patient for a excision of chest lipoma at [facility] on 01/21/15 with Dr Rexene Edison.

## 2015-01-06 NOTE — H&P (Signed)
CC: Left chest nodule  HPI: Ms. Nutting is a pleasant 54 yo F who presents with left chest nodule.  Has been there for years.  She is embarrassed by its appearance.  Nontender, no episodes of redness/induration or drainage.  Has had prior sebaceous cyst more laterally on this breast 14 years ago which "busted" and was drained without recurrence.  Otherwise doing well.  No fevers/chills, night sweats, shortness of breath, cough, chest pain, abdominal pain, nausea/vomiting, diarrhea/constipation, dysuria/hematuria.    Active Ambulatory Problems    Diagnosis Date Noted  . Benign breast cyst in female 11/02/2012  . DDD (degenerative disc disease), lumbosacral 09/24/2014  . Lumbar facet arthropathy 09/24/2014  . Lumbar radiculopathy 09/24/2014  . 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 Cholelithiases 12/13/2014   Resolved Ambulatory Problems    Diagnosis Date Noted  . No Resolved Ambulatory Problems   Past Medical History  Diagnosis Date  . Diabetes mellitus without complication   . Asthma   . Back pain   . Bulging disc   . Arthritis   . Gallstones 11-2014  . Anxiety and depression   . Sprain of ribs    Past Surgical History  Procedure Laterality Date  . Tubal ligation    . Breast cyst aspiration Left 2014    benign  . Breast cyst aspiration Left 2002     Medication List       This list is accurate as of: 01/06/15 10:50 AM.  Always use your most recent med list.               albuterol 108 (90 BASE) MCG/ACT inhaler  Commonly known as:  PROVENTIL HFA;VENTOLIN HFA  Inhale 2 puffs using inhaler every four to six hours as needed for wheezing, cough or SOB.     cyclobenzaprine 10 MG tablet  Commonly known as:  FLEXERIL  Take by mouth 3 (three) times daily.     FLUoxetine 40 MG capsule  Commonly known as:  PROZAC  Take by mouth.     nortriptyline 10 MG  capsule  Commonly known as:  PAMELOR  Take 1 capsule by mouth at bedtime.     omeprazole 20 MG capsule  Commonly known as:  PRILOSEC  Take 20 mg by mouth daily.     oxyCODONE 5 MG immediate release tablet  Commonly known as:  Oxy IR/ROXICODONE  Limit 1 tab by mouth 3-5 times per day if tolerated     pregabalin 25 MG capsule  Commonly known as:  LYRICA  Take 1 capsule by mouth 2 (two) times daily.       Allergies  Allergen Reactions  . Penicillin G     Other reaction(s): Other (qualifier value)  . Penicillins Hives  . Sulfa Antibiotics Swelling   Social History   Social History  . Marital Status: Divorced    Spouse Name: N/A  . Number of Children: N/A  . Years of Education: N/A   Occupational History  . Not on file.   Social History Main Topics  . Smoking status: Current Some Day Smoker    Types: Cigarettes  . Smokeless tobacco: Former Systems developer  . Alcohol Use: No  . Drug Use: No  . Sexual Activity: No   Other Topics Concern  . Not on file   Social History Narrative   Past Surgical History  Procedure Laterality Date  . Tubal ligation    .  Breast cyst aspiration Left 2014    benign  . Breast cyst aspiration Left 2002   ROS: Full ROS obtained, pertinent positives and negatives as above  Blood pressure 128/71, pulse 77, temperature 98.6 F (37 C), temperature source Oral, height 5\' 5"  (1.651 m), weight 184 lb (83.462 kg). GEN: NAD/A&Ox3 FACE: no obvious facial trauma, normal external nose, normal external ears EYES: no scleral icterus, no conjunctivitis HEAD: normocephalic atraumatic CV: RRR, no MRG RESP: moving air well, lungs clear CHEST: Approx 4x4x4 cm soft, compressible nodule medial left breast ABD: soft, nontender, nondistended EXT: moving all ext well, strength 5/5  Labs: No new labs Imaging: No new imaging  A/P 54 yo with left medial breast nodule.  Likely lipoma.  I have reassured patient that is unlikely that this is a malignancy but have  offered to remove it if she would like.  I have explained the benefits and risks associated with this procedure and she would like to proceed.   NEURO: cnII-XII grossly intact, sensation intact all 4 ext

## 2015-01-09 ENCOUNTER — Telehealth: Payer: Self-pay | Admitting: Surgery

## 2015-01-09 DIAGNOSIS — I10 Essential (primary) hypertension: Secondary | ICD-10-CM | POA: Insufficient documentation

## 2015-01-09 NOTE — Telephone Encounter (Signed)
Pt advised of pre op date/time and sx date. Sx: 01/21/15 with Dr Archie Endo of breat mass Pre op: 01/15/15 between 9-1pm--phone.   Pt has medicaid. No authorization is required.

## 2015-01-14 ENCOUNTER — Encounter: Payer: Self-pay | Admitting: Pain Medicine

## 2015-01-14 ENCOUNTER — Ambulatory Visit: Payer: Medicaid Other | Attending: Pain Medicine | Admitting: Pain Medicine

## 2015-01-14 VITALS — BP 113/65 | HR 79 | Temp 98.1°F | Resp 14 | Ht 65.0 in | Wt 184.0 lb

## 2015-01-14 DIAGNOSIS — M4806 Spinal stenosis, lumbar region: Secondary | ICD-10-CM | POA: Insufficient documentation

## 2015-01-14 DIAGNOSIS — M5126 Other intervertebral disc displacement, lumbar region: Secondary | ICD-10-CM | POA: Diagnosis not present

## 2015-01-14 DIAGNOSIS — M533 Sacrococcygeal disorders, not elsewhere classified: Secondary | ICD-10-CM | POA: Diagnosis not present

## 2015-01-14 DIAGNOSIS — M5136 Other intervertebral disc degeneration, lumbar region: Secondary | ICD-10-CM | POA: Insufficient documentation

## 2015-01-14 DIAGNOSIS — G588 Other specified mononeuropathies: Secondary | ICD-10-CM

## 2015-01-14 DIAGNOSIS — M47816 Spondylosis without myelopathy or radiculopathy, lumbar region: Secondary | ICD-10-CM | POA: Diagnosis not present

## 2015-01-14 DIAGNOSIS — M5137 Other intervertebral disc degeneration, lumbosacral region: Secondary | ICD-10-CM

## 2015-01-14 DIAGNOSIS — M545 Low back pain: Secondary | ICD-10-CM | POA: Diagnosis present

## 2015-01-14 DIAGNOSIS — M5416 Radiculopathy, lumbar region: Secondary | ICD-10-CM

## 2015-01-14 DIAGNOSIS — M79605 Pain in left leg: Secondary | ICD-10-CM | POA: Diagnosis present

## 2015-01-14 DIAGNOSIS — M79604 Pain in right leg: Secondary | ICD-10-CM | POA: Diagnosis present

## 2015-01-14 DIAGNOSIS — M48062 Spinal stenosis, lumbar region with neurogenic claudication: Secondary | ICD-10-CM

## 2015-01-14 MED ORDER — OXYCODONE HCL 5 MG PO TABS: ORAL_TABLET | ORAL | Status: DC

## 2015-01-14 NOTE — Progress Notes (Signed)
Subjective:    Patient ID: Gina Costa, female    DOB: May 18, 1961, 54 y.o.   MRN: [id number]  HPI Patient is 54 year old female returns to [facility] for further evaluation and treatment of pain involving the lower back and lower extremity regions predominantly the patient is with pain aggravated with standing walking with the pain describes a pressure-like sensation of the lower back that increases as patient spends more time on the feet and prevention is associated with lower extremity weakness. Patient is undergone surgical evaluation at the present time we also requesting surgical reevaluation. We will proceed with interventional treatment consisting of lumbar epidural steroid injection at time return appointment in attempt to decrease severity of patient's symptoms, minimize progression of patient's symptoms, and avoid the need for more involved treatment. The patient was understanding and in agreement with suggested treatment plan.       Review of Systems     Objective:   Physical Exam  There was tenderness over the splenius capitis and occipitalis musculature regions palpation of which reproduced pain of mild degree. There was mild tenderness over the acromioclavicular glenohumeral joint region. It appeared to be unremarkable Spurling's maneuver. Patient was with what appeared to be bilaterally equal grip strength. Tinel and Phalen's maneuver were without increased pain of any significant degree. There was tenderness to palpation over the cervical facet cervical paraspinal musculature region as well as the thoracic facet thoracic paraspinal musculature regions. No crepitus of the thoracic region noted. Palpation over the lumbar paraspinal muscles and lumbar facet region was tends to palpation of moderate degree. Lateral bending and rotation and extension and palpation of the lumbar facets reproduce moderate to moderately severe discomfort. Straight leg raising was limited  to approximately 20 without a definite increased pain with dorsiflexion noted. There was negative clonus negative Homans. EHL strength appeared to be decreased no definite sensory deficit of dermatomal distribution was detected. There was tenderness over the PSIS and PIS regions of moderate degree. Abdomen was soft without excessive tenderness to palpation and no costovertebral angle tenderness was noted.      Assessment & Plan:      Review of Systems     Objective:   Physical Exam  There was tenderness of the splenius capitis and occipitalis musculature region of mild degree. Palpation over the cervical facet cervical paraspinal musculature region reproduced mild discomfort. There was minimal tenderness of the acromioclavicular and glenohumeral joint regions. Patient appeared to be with bilaterally equal grip strength. Tinel and Phalen's maneuver were without increase of pain of any significant degree. Palpation over the thoracic facet thoracic paraspinal musculature region was a tends to palpation of moderate degree the lower thoracic region. No crepitus of the thoracic region was noted. Palpation over the lumbar paraspinal musculature region lumbar facet region was returns tends to palpation moderately severe degree. Lateral bending and rotation and extension and palpation of the lumbar facets reproduce moderately severe discomfort. Straight leg raising was tolerates approximately 20 without a definite increased pain with dorsiflexion noted. There was negative clonus negative Homans. There was minimal tenderness over the PSIS and PII S regions. EHL strength appeared to be decreased. There was negative clonus negative Homans. Abdomen was nontender and no costovertebral angle tenderness was noted    Assessment & Plan:    Degenerative disc disease lumbar spine Multilevel degenerative changes lumbar spine asymmetric edema of the right facet joint, L5-S1 level most involved, some progression  since prior exam, multilevel facet arthropathy L2-3, L3-4,  L4-5, and L5-S1. Disc bulging and stenosis noted on lumbar MRI  Lumbar facet syndrome  Lumbar stenosis with neurogenic claudication  Sacroiliac joint dysfunction     Plan   Continue present medication oxycodone. Patient will discuss Lyrica with Dr. Melrose Nakayama. Patient states that she is unable to tolerate Lyrica and wishes to resume the use of Neurontin  Lumbar epidural steroid injection to be performed at time return appointment  F/U PCP Dr. Posey Pronto and Elsworth Soho  for evaliation of  BP and general medical  condition  F/U surgical evaluation as discussed  F/U neurological evaluation With Dr. Melrose Nakayama to discuss discontinuing Lyrica and considering resuming Neurontin or another similar medication  May consider radiofrequency rhizolysis or intraspinal procedures pending response to present treatment and F/U evaluation   Patient to call [facility] should patient have concerns prior to scheduled return appointmen.

## 2015-01-14 NOTE — Patient Instructions (Addendum)
Continue present medication oxycodone and discuss Lyrica with Dr. Melrose Nakayama as we discussed today  Lumbar epidural steroid injection to be performed at time of return appointment  F/U PCP Dr. Posey Pronto and Elsworth Soho for evaliation of  BP and general medical  condition  F/U surgical evaluation as discussed  F/U neurological evaluation with Dr. Melrose Nakayama as discussed and informed Dr. Melrose Nakayama that you cannot tolerate Lyrica.  May consider radiofrequency rhizolysis or intraspinal procedures pending response to present treatment and F/U evaluation   Patient to call [facility] should patient have concerns prior to scheduled return appointmen.   Epidural Steroid Injection An epidural steroid injection is given to relieve pain in your neck, back, or legs that is caused by the irritation or swelling of a nerve root. This procedure involves injecting a steroid and numbing medicine (anesthetic) into the epidural space. The epidural space is the space between the outer covering of your spinal cord and the bones that form your backbone (vertebra).  LET [facility] CARE PROVIDER KNOW ABOUT:   Any allergies you have.  All medicines you are taking, including vitamins, herbs, eye drops, creams, and over-the-counter medicines such as aspirin.  Previous problems you or members of your family have had with the use of anesthetics.  Any blood disorders or blood clotting disorders you have.  Previous surgeries you have had.  Medical conditions you have. RISKS AND COMPLICATIONS Generally, this is a safe procedure. However, as with any procedure, complications can occur. Possible complications of epidural steroid injection include:  Headache.  Bleeding.  Infection.  Allergic reaction to the medicines.  Damage to your nerves. The response to this procedure depends on the underlying cause of the pain and its duration. People who have long-term (chronic) pain are less likely to benefit from epidural  steroids than are those people whose pain comes on strong and suddenly. BEFORE THE PROCEDURE   Ask your health care provider about changing or stopping your regular medicines. You may be advised to stop taking blood-thinning medicines a few days before the procedure.  You may be given medicines to reduce anxiety.  Arrange for someone to take you home after the procedure. PROCEDURE   You will remain awake during the procedure. You may receive medicine to make you relaxed.  You will be asked to lie on your stomach.  The injection site will be cleaned.  The injection site will be numbed with a medicine (local anesthetic).  A needle will be injected through your skin into the epidural space.  Your health care provider will use an X-ray machine to ensure that the steroid is delivered closest to the affected nerve. You may have minimal discomfort at this time.  Once the needle is in the right position, the local anesthetic and the steroid will be injected into the epidural space.  The needle will then be removed and a bandage will be applied to the injection site. AFTER THE PROCEDURE   You may be monitored for a short time before you go home.  You may feel weakness or numbness in your arm or leg, which disappears within hours.  You may be allowed to eat, drink, and take your regular medicine.  You may have soreness at the site of the injection. Document Released: 08/16/2007 Document Revised: 01/09/2013 Document Reviewed: 10/26/2012 [facility] Patient Information 2015 [place], [place]. This information is not intended to replace advice given to you by your health care provider. Make sure you discuss any questions you have with your  health care provider.  A prescription for OXYCODONE was given to you today.

## 2015-01-14 NOTE — Progress Notes (Signed)
Safety precautions to be maintained throughout the outpatient stay will include: orient to surroundings, keep bed in low position, maintain call bell within reach at all times, provide assistance with transfer out of bed and ambulation.  Discharged ambulatory at 9:30 am

## 2015-01-15 ENCOUNTER — Inpatient Hospital Stay: Admission: RE | Admit: 2015-01-15 | Payer: Medicaid Other | Source: Ambulatory Visit

## 2015-01-15 ENCOUNTER — Encounter: Payer: Self-pay | Admitting: *Deleted

## 2015-01-16 MED ORDER — FAMOTIDINE 20 MG PO TABS
ORAL_TABLET | ORAL | Status: AC
  Filled 2015-01-16: qty 1

## 2015-01-20 ENCOUNTER — Other Ambulatory Visit
Admission: RE | Admit: 2015-01-20 | Discharge: 2015-01-20 | Disposition: A | Discharge status: Home / Self Care | Payer: Medicaid Other | Source: Ambulatory Visit | Attending: Surgery | Admitting: Surgery

## 2015-01-20 ENCOUNTER — Encounter: Payer: Self-pay | Admitting: *Deleted

## 2015-01-20 ENCOUNTER — Telehealth: Payer: Self-pay

## 2015-01-20 DIAGNOSIS — R748 Abnormal levels of other serum enzymes: Secondary | ICD-10-CM | POA: Insufficient documentation

## 2015-01-20 LAB — COMPREHENSIVE METABOLIC PANEL
ALK PHOS: 79 U/L (ref 38–126)
ALT: 35 U/L (ref 14–54)
AST: 28 U/L (ref 15–41)
Albumin: 4.1 g/dL (ref 3.5–5.0)
Anion gap: 8 (ref 5–15)
BUN: 12 mg/dL (ref 6–20)
CO2: 26 mmol/L (ref 22–32)
CREATININE: 0.71 mg/dL (ref 0.44–1.00)
Calcium: 9.4 mg/dL (ref 8.9–10.3)
Chloride: 104 mmol/L (ref 101–111)
Glucose, Bld: 138 mg/dL — ABNORMAL HIGH (ref 65–99)
Potassium: 4.2 mmol/L (ref 3.5–5.1)
Sodium: 138 mmol/L (ref 135–145)
Total Bilirubin: 0.5 mg/dL (ref 0.3–1.2)
Total Protein: 7.4 g/dL (ref 6.5–8.1)

## 2015-01-20 NOTE — Telephone Encounter (Signed)
Dr. Rexene Edison returned phone call at this time. He would like to keep patient on schedule for tomorrow (8/31) and redraw CMP today.  Orders placed.

## 2015-01-20 NOTE — Patient Instructions (Signed)
  Your procedure is scheduled on: 01-21-15 Report to [place] To find out your arrival time please call [phone] between 1PM - 3PM on 01-20-15  Remember: Instructions that are not followed completely may result in serious medical risk, up to and including death, or upon the discretion of your surgeon and anesthesiologist your surgery may need to be rescheduled.    __X__ 1. Do not eat food or drink liquids after midnight. No gum chewing or hard candies.     __X__ 2. No Alcohol for 24 hours before or after surgery.   ____ 3. Bring all medications with you on the day of surgery if instructed.    ____ 4. Notify your doctor if there is any change in your medical condition     (cold, fever, infections).     Do not wear jewelry, make-up, hairpins, clips or nail polish.  Do not wear lotions, powders, or perfumes. You may wear deodorant.  Do not shave 48 hours prior to surgery. Men may shave face and neck.  Do not bring valuables to the hospital.    [facility] is not responsible for any belongings or valuables.               Contacts, dentures or bridgework may not be worn into surgery.  Leave your suitcase in the car. After surgery it may be brought to your room.  For patients admitted to the hospital, discharge time is determined by your  treatment team.   Patients discharged the day of surgery will not be allowed to drive home.   Please read over the following fact sheets that you were given:      __x__ Take these medicines the morning of surgery with A SIP OF WATER:    1. PROZAC  2. PRILOSEC  3. TAKE AN EXTRA PRILOSEC ON Tuesday NIGHT  4.  5.  6.  ____ Fleet Enema (as directed)   ____ Use CHG Soap as directed  ____ Use inhalers on the day of surgery  ____ Stop metformin 2 days prior to surgery    ____ Take 1/2 of usual insulin dose the night before surgery and none on the morning of surgery.   ____ Stop  Coumadin/Plavix/aspirin-N/A  ____ Stop Anti-inflammatories-NO NSAIDS OR ASA PRODUCTS-TYLENOL OK   ____ Stop supplements until after surgery.    ____ Bring C-Pap to the hospital.

## 2015-01-20 NOTE — Pre-Procedure Instructions (Signed)
RECEIVED LABS FROM [facility]-INFORMED DR Ronelle Nigh OF THIS-HE WANTS DR LUNDQUIST TO BE AWARE OF THIS- WALKED LABS DOWN TO DR LUNDQUISTS OFFICE AND GAVE TO [facility] AND SHE SAID THEY WILL LET DR Rexene Edison KNOW OF THIS

## 2015-01-20 NOTE — Telephone Encounter (Signed)
Returned phone call to patient at this time. Explained that Liver enzymes were elevated and that she would need these redrawn today. Verbalizes understanding of this and will go to [facility] now to have this done.  Will check back for results.

## 2015-01-20 NOTE — Telephone Encounter (Signed)
Pre-admit called stating that they have received recent labs from [facility] and anesthesia is asking if Dr. Rexene Edison can review and inform them whether he would like to proceed with Excisional Breast Mass scheduled tomorrow (8/31).  Results given:  T-Bili- 0.6 AST- 314 ALT- 420 WBC- 5.7 PLT- 314  Pt just seen by Dr. Marina Gravel on 7/23 but there were no lab results available.  Called [facility] at this time. Dr. Rexene Edison in [place], will call back.

## 2015-01-20 NOTE — Telephone Encounter (Signed)
Recheck on LFT's are Within normal limits. Notified Dr. Rexene Edison. Informed patient that we are good to proceed tomorrow and results given.

## 2015-01-21 ENCOUNTER — Ambulatory Visit: Payer: Medicaid Other | Admitting: Registered Nurse

## 2015-01-21 ENCOUNTER — Encounter: Payer: Self-pay | Admitting: *Deleted

## 2015-01-21 ENCOUNTER — Encounter
Admission: RE | Disposition: A | Discharge status: Home / Self Care | Payer: Self-pay | Source: Ambulatory Visit | Attending: Surgery

## 2015-01-21 ENCOUNTER — Ambulatory Visit
Admission: RE | Admit: 2015-01-21 | Discharge: 2015-01-21 | Disposition: A | Discharge status: Home / Self Care | Payer: Medicaid Other | Source: Ambulatory Visit | Attending: Surgery | Admitting: Surgery

## 2015-01-21 DIAGNOSIS — M1990 Unspecified osteoarthritis, unspecified site: Secondary | ICD-10-CM | POA: Diagnosis not present

## 2015-01-21 DIAGNOSIS — E119 Type 2 diabetes mellitus without complications: Secondary | ICD-10-CM | POA: Diagnosis not present

## 2015-01-21 DIAGNOSIS — M4800 Spinal stenosis, site unspecified: Secondary | ICD-10-CM | POA: Diagnosis not present

## 2015-01-21 DIAGNOSIS — F17210 Nicotine dependence, cigarettes, uncomplicated: Secondary | ICD-10-CM | POA: Diagnosis not present

## 2015-01-21 DIAGNOSIS — K219 Gastro-esophageal reflux disease without esophagitis: Secondary | ICD-10-CM | POA: Diagnosis not present

## 2015-01-21 DIAGNOSIS — J45909 Unspecified asthma, uncomplicated: Secondary | ICD-10-CM | POA: Insufficient documentation

## 2015-01-21 DIAGNOSIS — N6089 Other benign mammary dysplasias of unspecified breast: Secondary | ICD-10-CM | POA: Insufficient documentation

## 2015-01-21 DIAGNOSIS — F419 Anxiety disorder, unspecified: Secondary | ICD-10-CM | POA: Diagnosis not present

## 2015-01-21 DIAGNOSIS — N6082 Other benign mammary dysplasias of left breast: Secondary | ICD-10-CM | POA: Diagnosis not present

## 2015-01-21 HISTORY — DX: Anemia, unspecified: D64.9

## 2015-01-21 HISTORY — DX: Other specified abnormal findings of blood chemistry: R79.89

## 2015-01-21 HISTORY — DX: Abnormal results of liver function studies: R94.5

## 2015-01-21 HISTORY — PX: LIPOMA EXCISION: SHX5283

## 2015-01-21 HISTORY — DX: Anxiety disorder, unspecified: F41.9

## 2015-01-21 HISTORY — DX: Gastro-esophageal reflux disease without esophagitis: K21.9

## 2015-01-21 LAB — GLUCOSE, CAPILLARY: Glucose-Capillary: 115 mg/dL — ABNORMAL HIGH (ref 65–99)

## 2015-01-21 SURGERY — EXCISION LIPOMA
Anesthesia: General | Laterality: Left | Wound class: Dirty or Infected

## 2015-01-21 MED ORDER — LIDOCAINE-EPINEPHRINE (PF) 1 %-1:200000 IJ SOLN
INTRAMUSCULAR | Status: DC | PRN
  Administered 2015-01-21: 5 mL

## 2015-01-21 MED ORDER — FENTANYL CITRATE (PF) 100 MCG/2ML IJ SOLN: 25.0000 ug | INTRAMUSCULAR | Status: DC | PRN

## 2015-01-21 MED ORDER — HYDROCODONE-ACETAMINOPHEN 5-325 MG PO TABS: 1.0000 | ORAL_TABLET | ORAL | Status: DC | PRN

## 2015-01-21 MED ORDER — ACETAMINOPHEN 10 MG/ML IV SOLN
INTRAVENOUS | Status: AC
  Filled 2015-01-21: qty 100

## 2015-01-21 MED ORDER — BACITRACIN-NEOMYCIN-POLYMYXIN 400-5-5000 EX OINT
TOPICAL_OINTMENT | CUTANEOUS | Status: AC
  Filled 2015-01-21: qty 1

## 2015-01-21 MED ORDER — CLINDAMYCIN PHOSPHATE 600 MG/50ML IV SOLN
INTRAVENOUS | Status: AC
  Filled 2015-01-21: qty 50

## 2015-01-21 MED ORDER — GLYCOPYRROLATE 0.2 MG/ML IJ SOLN
INTRAMUSCULAR | Status: DC | PRN
  Administered 2015-01-21: 0.2 mg via INTRAVENOUS

## 2015-01-21 MED ORDER — LACTATED RINGERS IV SOLN: INTRAVENOUS | Status: DC

## 2015-01-21 MED ORDER — KETOROLAC TROMETHAMINE 30 MG/ML IJ SOLN
INTRAMUSCULAR | Status: DC | PRN
  Administered 2015-01-21: 30 mg via INTRAVENOUS

## 2015-01-21 MED ORDER — CLINDAMYCIN PHOSPHATE 600 MG/50ML IV SOLN
INTRAVENOUS | Status: DC | PRN
  Administered 2015-01-21: 600 mg via INTRAVENOUS

## 2015-01-21 MED ORDER — MIDAZOLAM HCL 2 MG/2ML IJ SOLN
INTRAMUSCULAR | Status: DC | PRN
  Administered 2015-01-21: 2 mg via INTRAVENOUS

## 2015-01-21 MED ORDER — LIDOCAINE HCL (CARDIAC) 20 MG/ML IV SOLN
INTRAVENOUS | Status: DC | PRN
  Administered 2015-01-21: 100 mg via INTRAVENOUS

## 2015-01-21 MED ORDER — ACETAMINOPHEN 10 MG/ML IV SOLN
INTRAVENOUS | Status: DC | PRN
  Administered 2015-01-21: 1000 mg via INTRAVENOUS

## 2015-01-21 MED ORDER — LIDOCAINE-EPINEPHRINE (PF) 1 %-1:200000 IJ SOLN
INTRAMUSCULAR | Status: AC
  Filled 2015-01-21: qty 30

## 2015-01-21 MED ORDER — ONDANSETRON HCL 4 MG/2ML IJ SOLN: 4.0000 mg | Freq: Once | INTRAMUSCULAR | Status: DC | PRN

## 2015-01-21 MED ORDER — SODIUM CHLORIDE 0.9 % IV SOLN
INTRAVENOUS | Status: DC
  Administered 2015-01-21: 07:00:00 via INTRAVENOUS

## 2015-01-21 MED ORDER — FENTANYL CITRATE (PF) 100 MCG/2ML IJ SOLN
INTRAMUSCULAR | Status: DC | PRN
  Administered 2015-01-21 (×2): 50 ug via INTRAVENOUS

## 2015-01-21 MED ORDER — ONDANSETRON HCL 4 MG/2ML IJ SOLN
INTRAMUSCULAR | Status: DC | PRN
  Administered 2015-01-21: 4 mg via INTRAVENOUS

## 2015-01-21 MED ORDER — PROPOFOL 10 MG/ML IV BOLUS
INTRAVENOUS | Status: DC | PRN
  Administered 2015-01-21: 150 mg via INTRAVENOUS

## 2015-01-21 SURGICAL SUPPLY — 29 items
CANISTER SUCT 1200ML W/VALVE (MISCELLANEOUS) ×2 IMPLANT
CHLORAPREP W/TINT 26ML (MISCELLANEOUS) ×2 IMPLANT
DRAPE LAPAROTOMY 100X77 ABD (DRAPES) ×2 IMPLANT
DRESSING TELFA 4X3 1S ST N-ADH (GAUZE/BANDAGES/DRESSINGS) ×2 IMPLANT
DRSG TEGADERM 4X4.75 (GAUZE/BANDAGES/DRESSINGS) ×2 IMPLANT
ELECT CAUTERY BLADE 6.4 (BLADE) ×4 IMPLANT
GLOVE BIO SURGEON STRL SZ7 (GLOVE) ×6 IMPLANT
GOWN STRL REUS W/ TWL LRG LVL3 (GOWN DISPOSABLE) ×2 IMPLANT
GOWN STRL REUS W/TWL LRG LVL3 (GOWN DISPOSABLE) ×2
HANDLE YANKAUER SUCT BULB TIP (MISCELLANEOUS) ×2 IMPLANT
KIT RM TURNOVER STRD PROC AR (KITS) ×2 IMPLANT
LABEL OR SOLS (LABEL) ×2 IMPLANT
NEEDLE HYPO 25X1 1.5 SAFETY (NEEDLE) ×2 IMPLANT
PACK BASIN MINOR ARMC (MISCELLANEOUS) ×2 IMPLANT
PAD GROUND ADULT SPLIT (MISCELLANEOUS) ×2 IMPLANT
PENCIL ELECTRO HAND CTR (MISCELLANEOUS) ×2 IMPLANT
SUT ETHILON 2 0 FS 18 (SUTURE) ×4 IMPLANT
SUT ETHILON 4-0 (SUTURE) ×2
SUT ETHILON 4-0 FS2 18XMFL BLK (SUTURE) ×2
SUT MNCRL 4-0 (SUTURE)
SUT MNCRL 4-0 27XMFL (SUTURE)
SUT VIC AB 3-0 SH 27 (SUTURE)
SUT VIC AB 3-0 SH 27X BRD (SUTURE) IMPLANT
SUTURE ETHLN 4-0 FS2 18XMF BLK (SUTURE) ×2 IMPLANT
SUTURE MNCRL 4-0 27XMF (SUTURE) IMPLANT
SYR BULB EAR ULCER 3OZ GRN STR (SYRINGE) ×2 IMPLANT
SYRINGE 10CC LL (SYRINGE) ×2 IMPLANT
TUBING CONNECTING 10 (TUBING) ×2 IMPLANT
WATER STERILE IRR 1000ML POUR (IV SOLUTION) ×2 IMPLANT

## 2015-01-21 NOTE — Discharge Instructions (Addendum)
Do not drive on pain medications °Do not lift greater than 15 lbs for a period of 6 weeks °Call or return to ER if you develop fever greater than 101.5, nausea/vomiting, increased pain, redness/drainage from incisions °Take bandages off in 48 hours.  Okay to shower with bandages on or after they come off, no tub baths °

## 2015-01-21 NOTE — Anesthesia Postprocedure Evaluation (Signed)
  Anesthesia Post-op Note  Patient: Gina Costa  Procedure(s) Performed: Procedure(s): EXCISION SEBACEOUS CYST LEFT CHEST  (Left)  Anesthesia type:General  Patient location: PACU  Post pain: Pain level controlled  Post assessment: Post-op Vital signs reviewed, Patient's Cardiovascular Status Stable, Respiratory Function Stable, Patent Airway and No signs of Nausea or vomiting  Post vital signs: Reviewed and stable  Last Vitals:  Filed Vitals:   01/21/15 0921  BP: 136/80  Pulse: 94  Temp:   Resp:     Level of consciousness: awake, alert  and patient cooperative  Complications: No apparent anesthesia complications

## 2015-01-21 NOTE — Brief Op Note (Signed)
01/21/2015  8:20 AM  PATIENT:  Gina Costa  54 y.o. female  PRE-OPERATIVE DIAGNOSIS:  Chest lipoma  POST-OPERATIVE DIAGNOSIS:  sebaceous cyst of the chest   PROCEDURE:  Procedure(s): EXCISION SEBACEOUS CYST LEFT CHEST  (Left)  SURGEON:  Surgeon(s) and Role:    * Marlyce Huge, MD - Primary  PHYSICIAN ASSISTANT:   ASSISTANTS: none   ANESTHESIA:   general  EBL:   10 ml  BLOOD ADMINISTERED:none  DRAINS: none   LOCAL MEDICATIONS USED:  LIDOCAINE   SPECIMEN:  Excision  DISPOSITION OF SPECIMEN:  PATHOLOGY  COUNTS:  YES  TOURNIQUET:  * No tourniquets in log *  DICTATION: .Note written in EPIC  PLAN OF CARE: Discharge to home after PACU  PATIENT DISPOSITION:  PACU - hemodynamically stable.   Delay start of Pharmacological VTE agent (>24hrs) due to surgical blood loss or risk of bleeding: not applicable

## 2015-01-21 NOTE — Transfer of Care (Signed)
Immediate Anesthesia Transfer of Care Note  Patient: Gina Costa  Procedure(s) Performed: Procedure(s): EXCISION SEBACEOUS CYST LEFT CHEST  (Left)  Patient Location: PACU  Anesthesia Type:General  Level of Consciousness: sedated  Airway & Oxygen Therapy: Patient Spontanous Breathing and Patient connected to face mask oxygen  Post-op Assessment: Report given to RN and Post -op Vital signs reviewed and stable  Post vital signs: Reviewed and stable  Last Vitals:  Filed Vitals:   01/21/15 0824  BP: 138/80  Pulse: 114  Temp: 37 C  Resp: 15    Complications: No apparent anesthesia complications

## 2015-01-21 NOTE — Progress Notes (Signed)
No acute issues.  No changes to H and P.  Proceed with surgery.

## 2015-01-21 NOTE — Anesthesia Preprocedure Evaluation (Addendum)
Anesthesia Evaluation  Patient identified by MRN, date of birth, ID band Patient awake    Reviewed: Allergy & Precautions, NPO status , Patient's Chart, lab work & pertinent test results  Airway Mallampati: III  TM Distance: >3 FB Neck ROM: Full    Dental  (+) Chipped   Pulmonary asthma , Current Smoker,  breath sounds clear to auscultation  Pulmonary exam normal       Cardiovascular negative cardio ROS Normal cardiovascular exam    Neuro/Psych Anxiety Spinal stenosis, bulging discs  Neuromuscular disease    GI/Hepatic Neg liver ROS, GERD-  Medicated and Controlled,  Endo/Other  diabetes  Renal/GU negative Renal ROS  negative genitourinary   Musculoskeletal  (+) Arthritis -, Osteoarthritis,    Abdominal Normal abdominal exam  (+)   Peds negative pediatric ROS (+)  Hematology  (+) anemia ,   Anesthesia Other Findings   Reproductive/Obstetrics                            Anesthesia Physical Anesthesia Plan  ASA: III  Anesthesia Plan: General   Post-op Pain Management:    Induction: Intravenous and Rapid sequence  Airway Management Planned: Oral ETT and LMA  Additional Equipment:   Intra-op Plan:   Post-operative Plan: Extubation in OR  Informed Consent: I have reviewed the patients History and Physical, chart, labs and discussed the procedure including the risks, benefits and alternatives for the proposed anesthesia with the patient or authorized representative who has indicated his/her understanding and acceptance.   Dental advisory given  Plan Discussed with: CRNA and Surgeon  Anesthesia Plan Comments:        Anesthesia Quick Evaluation

## 2015-01-21 NOTE — Op Note (Signed)
Preoperative diagnosis: Left breast lipoma Postoperative diagnosis: Left breast sebaceous cyst Procedure performed: Excision of left sebaceous cyst approx 3 x 3 x 3 cm Surgeon: Almus Woodham Anesthesia: General LMA EBL: 10 ml Specimen: None  Indication for procedure: Gina Costa  is a pleasant 81 F who presents with a left breast nodule.  She was brought to the OR for excision of breast mass.  Details of procedure:  Informed consent was obtained.  Gina Costa was brought to the operating room suite and laid supine on the OR table.  She was induced, LMA was placed and general anesthesia was administered.  Her left chest was prepped and draped in the standard fashion.  A timeout was performed identifying patient's name, operative site and procedure to be performed.  An ellipse was made over the area.  It was deepened.  The cyst was unintentionally entered.  It was then carefully dissected out.  The wound was then irrigated and closed using 2-0 nylon vertical mattress sutures.  A sterile dressing was then placed over the wound.  The patient was then awakened, LMA was removed and he was transferred to PACU.  There were no immediate complications.  Needle, sponge and instrument count was correct at the end of the procedure.

## 2015-01-21 NOTE — Anesthesia Procedure Notes (Addendum)
Procedure Name: LMA Insertion Date/Time: 01/21/2015 7:35 AM Performed by: Doreen Salvage Pre-anesthesia Checklist: Patient identified, Patient being monitored, Timeout performed, Emergency Drugs available and Suction available Patient Re-evaluated:Patient Re-evaluated prior to inductionOxygen Delivery Method: Circle system utilized Preoxygenation: Pre-oxygenation with 100% oxygen Intubation Type: IV induction Ventilation: Mask ventilation without difficulty LMA: LMA inserted LMA Size: 4.5 Tube type: Oral Number of attempts: 1 Placement Confirmation: positive ETCO2 and breath sounds checked- equal and bilateral Tube secured with: Tape Dental Injury: Teeth and Oropharynx as per pre-operative assessment

## 2015-01-23 LAB — SURGICAL PATHOLOGY

## 2015-01-28 ENCOUNTER — Ambulatory Visit: Payer: Medicaid Other | Attending: Pain Medicine | Admitting: Pain Medicine

## 2015-01-28 ENCOUNTER — Encounter: Payer: Self-pay | Admitting: Pain Medicine

## 2015-01-28 VITALS — BP 118/71 | HR 83 | Temp 98.0°F | Resp 16 | Ht 65.0 in | Wt 186.0 lb

## 2015-01-28 DIAGNOSIS — M4806 Spinal stenosis, lumbar region: Secondary | ICD-10-CM | POA: Diagnosis not present

## 2015-01-28 DIAGNOSIS — M5136 Other intervertebral disc degeneration, lumbar region: Secondary | ICD-10-CM | POA: Insufficient documentation

## 2015-01-28 DIAGNOSIS — M545 Low back pain: Secondary | ICD-10-CM | POA: Diagnosis present

## 2015-01-28 DIAGNOSIS — M5126 Other intervertebral disc displacement, lumbar region: Secondary | ICD-10-CM | POA: Insufficient documentation

## 2015-01-28 DIAGNOSIS — M5416 Radiculopathy, lumbar region: Secondary | ICD-10-CM

## 2015-01-28 DIAGNOSIS — M533 Sacrococcygeal disorders, not elsewhere classified: Secondary | ICD-10-CM

## 2015-01-28 DIAGNOSIS — M1288 Other specific arthropathies, not elsewhere classified, other specified site: Secondary | ICD-10-CM | POA: Diagnosis not present

## 2015-01-28 DIAGNOSIS — M79605 Pain in left leg: Secondary | ICD-10-CM | POA: Diagnosis present

## 2015-01-28 DIAGNOSIS — M5137 Other intervertebral disc degeneration, lumbosacral region: Secondary | ICD-10-CM

## 2015-01-28 DIAGNOSIS — M48062 Spinal stenosis, lumbar region with neurogenic claudication: Secondary | ICD-10-CM

## 2015-01-28 DIAGNOSIS — G588 Other specified mononeuropathies: Secondary | ICD-10-CM

## 2015-01-28 DIAGNOSIS — M79604 Pain in right leg: Secondary | ICD-10-CM | POA: Diagnosis present

## 2015-01-28 DIAGNOSIS — M47816 Spondylosis without myelopathy or radiculopathy, lumbar region: Secondary | ICD-10-CM

## 2015-01-28 MED ORDER — SODIUM CHLORIDE 0.9 % IJ SOLN
INTRAMUSCULAR | Status: AC
  Filled 2015-01-28: qty 20

## 2015-01-28 MED ORDER — LIDOCAINE HCL (PF) 1 % IJ SOLN
INTRAMUSCULAR | Status: AC
  Administered 2015-01-28: 10:00:00
  Filled 2015-01-28: qty 5

## 2015-01-28 MED ORDER — TRIAMCINOLONE ACETONIDE 40 MG/ML IJ SUSP
INTRAMUSCULAR | Status: AC
  Administered 2015-01-28: 10:00:00
  Filled 2015-01-28: qty 1

## 2015-01-28 MED ORDER — ORPHENADRINE CITRATE 30 MG/ML IJ SOLN
INTRAMUSCULAR | Status: AC
  Administered 2015-01-28: 10:00:00
  Filled 2015-01-28: qty 2

## 2015-01-28 MED ORDER — BUPIVACAINE HCL (PF) 0.25 % IJ SOLN
INTRAMUSCULAR | Status: AC
  Filled 2015-01-28: qty 30

## 2015-01-28 NOTE — Patient Instructions (Addendum)
PLAN   Continue present medication oxycodone  F/U PCP Dr. Posey Pronto and Freddy Finner for evaliation of  BP and general medical  condition  F/U surgical evaluation as discussed  F/U neurological evaluation. May consider pending follow-up evaluations  May consider radiofrequency rhizolysis or intraspinal procedures pending response to present treatment and F/U evaluation   Patient to call [facility] should patient have concerns prior to scheduled return appointment.  Pain Management Discharge Instructions  General Discharge Instructions :  If you need to reach your doctor call: Monday-Friday 8:00 am - 4:00 pm at [phone] or toll free [phone].  After clinic hours [phone] to have operator reach doctor.  Bring all of your medication bottles to all your appointments in the pain clinic.  To cancel or reschedule your appointment with Pain Management please remember to call 24 hours in advance to avoid a fee.  Refer to the educational materials which you have been given on: General Risks, I had my Procedure. Discharge Instructions, Post Sedation.  Post Procedure Instructions:  The drugs you were given will stay in your system until tomorrow, so for the next 24 hours you should not drive, make any legal decisions or drink any alcoholic beverages.  You may eat anything you prefer, but it is better to start with liquids then soups and crackers, and gradually work up to solid foods.  Please notify your doctor immediately if you have any unusual bleeding, trouble breathing or pain that is not related to your normal pain.  Depending on the type of procedure that was done, some parts of your body may feel week and/or numb.  This usually clears up by tonight or the next day.  Walk with the use of an assistive device or accompanied by an adult for the 24 hours.  You may use ice on the affected area for the first 24 hours.  Put ice in a Ziploc bag and cover with a  towel and place against area 15 minutes on 15 minutes off.  You may switch to heat after 24 hours.Epidural Steroid Injection Patient Information  Description: The epidural space surrounds the nerves as they exit the spinal cord.  In some patients, the nerves can be compressed and inflamed by a bulging disc or a tight spinal canal (spinal stenosis).  By injecting steroids into the epidural space, we can bring irritated nerves into direct contact with a potentially helpful medication.  These steroids act directly on the irritated nerves and can reduce swelling and inflammation which often leads to decreased pain.  Epidural steroids may be injected anywhere along the spine and from the neck to the low back depending upon the location of your pain.   After numbing the skin with local anesthetic (like Novocaine), a small needle is passed into the epidural space slowly.  You may experience a sensation of pressure while this is being done.  The entire block usually last less than 10 minutes.  Conditions which may be treated by epidural steroids:   Low back and leg pain  Neck and arm pain  Spinal stenosis  Post-laminectomy syndrome  Herpes zoster (shingles) pain  Pain from compression fractures  Preparation for the injection:  1. Do not eat any solid food or dairy products within 6 hours of your appointment.  2. You may drink clear liquids up to 2 hours before appointment.  Clear liquids include water, black coffee, juice or soda.  No milk or cream please. 3. You may take your regular  medication, including pain medications, with a sip of water before your appointment  Diabetics should hold regular insulin (if taken separately) and take 1/2 normal NPH dos the morning of the procedure.  Carry some sugar containing items with you to your appointment. 4. A driver must accompany you and be prepared to drive you home after your procedure.  5. Bring all your current medications with your. 6. An IV may  be inserted and sedation may be given at the discretion of the physician.   7. A blood pressure cuff, EKG and other monitors will often be applied during the procedure.  Some patients may need to have extra oxygen administered for a short period. 8. You will be asked to provide medical information, including your allergies, prior to the procedure.  We must know immediately if you are taking blood thinners (like Coumadin/Warfarin)  Or if you are allergic to IV iodine contrast (dye). We must know if you could possible be pregnant.  Possible side-effects:  Bleeding from needle site  Infection (rare, may require surgery)  Nerve injury (rare)  Numbness & tingling (temporary)  Difficulty urinating (rare, temporary)  Spinal headache ( a headache worse with upright posture)  Light -headedness (temporary)  Pain at injection site (several days)  Decreased blood pressure (temporary)  Weakness in arm/leg (temporary)  Pressure sensation in back/neck (temporary)  Call if you experience:  Fever/chills associated with headache or increased back/neck pain.  Headache worsened by an upright position.  New onset weakness or numbness of an extremity below the injection site  Hives or difficulty breathing (go to the emergency room)  Inflammation or drainage at the infection site  Severe back/neck pain  Any new symptoms which are concerning to you  Please note:  Although the local anesthetic injected can often make your back or neck feel good for several hours after the injection, the pain will likely return.  It takes 3-7 days for steroids to work in the epidural space.  You may not notice any pain relief for at least that one week.  If effective, we will often do a series of three injections spaced 3-6 weeks apart to maximally decrease your pain.  After the initial series, we generally will wait several months before considering a repeat injection of the same type.  If you have any  questions, please call [phone] [facility]

## 2015-01-28 NOTE — Progress Notes (Signed)
   Subjective:    Patient ID: Gina Costa, female    DOB: November 20, 1960, 54 y.o.   MRN: [id number]  HPI  PROCEDURE PERFORMED: Lumbar epidural steroid injection   NOTE: The patient is a 54 y.o. female who returns to [place] for further evaluation and treatment of pain involving the lumbar and lower extremity region. MRI revealed the patient to be with degenerative disc disease lumbar spine with multilevel degenerative changes lumbar spine asymmetric edema of the right facet joint, L5-S1 level most involved, some progression since prior exam, multilevel facet arthropathy L2-3, L3-4, L4-5, and L5-S1. Disc bulging and stenosis noted on lumbar MRI. Marland Kitchen There is concern regarding intraspinal abnormalities contributing to patient's symptomatology The risks, benefits, and expectations of the procedure have been discussed and explained to the patient who was understanding and in agreement with suggested treatment plan. We will proceed with lumbar epidural steroid injection as discussed and as explained to the patient who is willing to proceed with procedure as planned.   DESCRIPTION OF PROCEDURE: Lumbar epidural steroid injection with  EKG, blood pressure, pulse, and pulse oximetry monitoring. The procedure was performed with the patient in the prone position under fluoroscopic guidance. A local anesthetic skin wheal of 1.5% plain lidocaine was accomplished at proposed entry site. An 18-gauge Tuohy epidural needle was inserted at the L 4 vertebral body level right of the midline via loss-of-resistance technique with negative heme and negative CSF return. A total of 4 mL of Preservative-Free normal saline with 40 mg of Kenalog injected incrementally via epidurally placed needle. Needle was removed.    A total of 40 mg of Kenalog was utilized for the procedure.   The patient tolerated the injection well.    PLAN:   1. Medications: We will continue presently prescribed medication  oxycodone 2. Will consider modification of treatment regimen pending response to treatment rendered on today's visit and follow-up evaluation. 3. The patient is to follow-up with primary care physician Dr. Posey Pronto and Elsworth Soho regarding blood pressure and general medical condition status post lumbar epidural steroid injection performed on today's visit. 4. Surgical evaluation.. Patient undergo surgical evaluation as discussed. We will request that patient be scheduled for neurosurgical reevaluation at this time 5. Neurological evaluation.. We will consider PNCV/EMG studies and other studies as discussed 6. The patient may be a candidate for radiofrequency procedures, implantation device, and other treatment pending response to treatment and follow-up evaluation. 7. The patient has been advised to adhere to proper body mechanics and avoid activities which appear to aggravate condition. 8. The patient has been advised to call the Pain Management Center prior to scheduled return appointment should there be significant change in condition or should there be sign  The patient is understanding and agrees with the suggested  treatment plan   Review of Systems     Objective:   Physical Exam        Assessment & Plan:

## 2015-01-28 NOTE — Progress Notes (Signed)
Safety precautions to be maintained throughout the outpatient stay will include: orient to surroundings, keep bed in low position, maintain call bell within reach at all times, provide assistance with transfer out of bed and ambulation.  

## 2015-01-29 ENCOUNTER — Ambulatory Visit (INDEPENDENT_AMBULATORY_CARE_PROVIDER_SITE_OTHER): Payer: Medicaid Other | Admitting: Surgery

## 2015-01-29 ENCOUNTER — Encounter: Payer: Self-pay | Admitting: Surgery

## 2015-01-29 ENCOUNTER — Telehealth: Payer: Self-pay | Admitting: *Deleted

## 2015-01-29 VITALS — BP 138/84 | HR 97 | Temp 98.4°F | Wt 186.0 lb

## 2015-01-29 DIAGNOSIS — Z09 Encounter for follow-up examination after completed treatment for conditions other than malignant neoplasm: Secondary | ICD-10-CM

## 2015-01-29 NOTE — Patient Instructions (Signed)
You will need to come back in 1 week (Next Thursday) to see a nurse to have the remaining sutures removed.  Please call if you develop fever, increased redness, or drainage.

## 2015-01-29 NOTE — Telephone Encounter (Signed)
No problem post procedure phone call

## 2015-01-29 NOTE — Progress Notes (Signed)
Surgery clinic note  S: No acute issues.  No pain, no drainage  O:Blood pressure 138/84, pulse 97, temperature 98.4 F (36.9 C), temperature source Oral, weight 186 lb (84.369 kg), last menstrual period 01/15/2011. GEN: NAD/A&Ox3 BREAST: Incision c/d/i, wound with sutures, no drainage/erythema/induration  A/P 54 yo s/p excision of sebaceous cyst, path removed - no wound issues, still POD 9 following closure under tension - half sutures removed, other half to be removed in 1 week

## 2015-02-05 ENCOUNTER — Ambulatory Visit: Payer: Medicaid Other

## 2015-02-05 VITALS — BP 139/83 | HR 79 | Temp 98.6°F | Ht 65.0 in | Wt 184.8 lb

## 2015-02-05 DIAGNOSIS — Z4802 Encounter for removal of sutures: Secondary | ICD-10-CM

## 2015-02-05 NOTE — Progress Notes (Signed)
Patient seen in office this am as nurse visit for suture removal. Patient has no complaints this morning. Incision area is clean, dry, and there is no redness present. Patient denies fever, nausea, and vomiting.  Sutures removed without incident. Pt did well throughout removal.

## 2015-02-12 ENCOUNTER — Ambulatory Visit: Payer: Medicaid Other | Attending: Pain Medicine | Admitting: Pain Medicine

## 2015-02-12 VITALS — BP 123/67 | HR 79 | Temp 98.3°F | Resp 16 | Ht 66.0 in | Wt 184.0 lb

## 2015-02-12 DIAGNOSIS — M5137 Other intervertebral disc degeneration, lumbosacral region: Secondary | ICD-10-CM

## 2015-02-12 DIAGNOSIS — M545 Low back pain: Secondary | ICD-10-CM | POA: Diagnosis present

## 2015-02-12 DIAGNOSIS — M5126 Other intervertebral disc displacement, lumbar region: Secondary | ICD-10-CM | POA: Insufficient documentation

## 2015-02-12 DIAGNOSIS — M4806 Spinal stenosis, lumbar region: Secondary | ICD-10-CM | POA: Insufficient documentation

## 2015-02-12 DIAGNOSIS — G588 Other specified mononeuropathies: Secondary | ICD-10-CM

## 2015-02-12 DIAGNOSIS — M5136 Other intervertebral disc degeneration, lumbar region: Secondary | ICD-10-CM | POA: Diagnosis not present

## 2015-02-12 DIAGNOSIS — M48062 Spinal stenosis, lumbar region with neurogenic claudication: Secondary | ICD-10-CM

## 2015-02-12 DIAGNOSIS — M533 Sacrococcygeal disorders, not elsewhere classified: Secondary | ICD-10-CM

## 2015-02-12 DIAGNOSIS — M47816 Spondylosis without myelopathy or radiculopathy, lumbar region: Secondary | ICD-10-CM | POA: Insufficient documentation

## 2015-02-12 DIAGNOSIS — M5416 Radiculopathy, lumbar region: Secondary | ICD-10-CM

## 2015-02-12 DIAGNOSIS — M546 Pain in thoracic spine: Secondary | ICD-10-CM | POA: Diagnosis present

## 2015-02-12 MED ORDER — OXYCODONE HCL 5 MG PO TABS: ORAL_TABLET | ORAL | Status: DC

## 2015-02-12 NOTE — Progress Notes (Signed)
Subjective:    Patient ID: Gina Costa, female    DOB: [birth year], 54 y.o.   MRN: [id number]  HPI Patient is 54 year old female returns to [place] for further evaluation and treatment of pain involving the region of the mid to lower back and lower extremity region. Patient is with significant degenerative changes of the lumbar spine with lower back lower extremity pain paresthesias and weakness. Patient's pain is aggravated by standing walking and becomes more intense as the day progresses . Patient denies any trauma change in events of daily living the call significant change in symptomatology. We discussed patient undergoing neurosurgical reevaluation and we'll schedule patient for neurosurgical reevaluation as we have previously attempted. We will also proceed with interventional treatment at time return appointment in attempt to decrease severity of symptoms, minimize progression of symptoms, and avoid the need for more involved treatment. The patient was understanding and in agreement with suggested treatment plan. We will proceed with lumbar facet, medial branch nerve, blocks at time return appointment. Patient appears to be with significant component of pain due to facet arthropathy with facet syndrome. The patient was in agreement with suggested treatment plan.      Review of Systems     Objective:   Physical Exam  There was tenderness of the splenius capitis and occipitalis musculature region of mild degree. There was mild tenderness of the acromioclavicular glenohumeral joint region. Patient was without significant increase of pain with Tinel and Phalen's maneuver and appeared to be with bilaterally equal grip strength. There appeared to be unremarkable Spurling's maneuver. Palpation over the thoracic facet thoracic paraspinal musculature region was associated with increased pain of minimal degree. No crepitus of the thoracic region was noted. Palpation over the  lumbar paraspinal muscles region lumbar facet region associated with moderately severe discomfort. Lateral bending and rotation and extension and palpation of the lumbar facets reproduce moderately severe discomfort. Straight leg raising was tolerates approximately 20 without a definite increase of pain with dorsiflexion noted. There appeared to be negative clonus negative Homans. Lateral bending and rotation and extension and palpation of the lumbar facets reproduce predominant portion of patient's pain. There was mild to moderate tenderness of the PSIS and PII S region as well as the gluteal and piriformis musculature region. There was mild tenderness of the greater trochanteric region and iliotibial band region. No definite sensory deficit of dermatomal distribution was detected. DTRs were difficult to elicit patient had difficulty relaxing. There was negative clonus negative Homans Abdomen was nontender with no costovertebral maintenance noted    Assessment & Plan:    Degenerative disc disease lumbar spine Multilevel degenerative changes lumbar spine asymmetric edema of the right facet joint, L5-S1 level most involved, some progression since prior exam, multilevel facet arthropathy L2-3, L3-4, L4-5, and L5-S1. Disc bulging and stenosis noted on lumbar MRI  Lumbar facet syndrome  Lumbar stenosis with neurogenic claudication  Sacroiliac joint dysfunction    PLAN   Continue present medication oxycodone. The patient states that she is not tolerating Lyrica very well. The patient will follow-up with Dr. Melrose Nakayama prescribed Lyrica  Lumbar facet, medial branch nerve, blocks to be performed at time return appointment  F/U PCP Dr. Posey Pronto and Elsworth Soho for evaliation of  BP and general medical  condition. We will also informed patient to discuss neurosurgical evaluation with Dr. Posey Pronto and Dr. Elsworth Soho. We will attempt scheduled appointment for patient and we are unable to do such. Last the patient to  follow-up with  Dr. Posey Pronto and Dr. Elsworth Soho in this regard  F/U surgical evaluation. We will attempt to schedule neurosurgical reevaluation at this time  F/U neurological evaluation. The patient will follow-up with Dr. Melrose Nakayama will also discuss Lyrica with Dr. Melrose Nakayama. Patient states that she is not tolerating Lyrica well  May consider radiofrequency rhizolysis or intraspinal procedures pending response to present treatment and F/U evaluation   Patient to call Pain Management Center should patient have concerns prior to scheduled return appointment.

## 2015-02-12 NOTE — Patient Instructions (Addendum)
PLAN   Continue present medication oxycodone  Lumbar facet, medial branch nerve, blocks to be performed at time return appointment  F/U PCP Dr. Posey Pronto and Elsworth Soho for evaliation of  BP and general medical  condition  F/U surgical evaluation. Neurosurgical reevaluation as discussed  F/U neurological evaluation. Follow-up as discussed  May consider radiofrequency rhizolysis or intraspinal procedures pending response to present treatment and F/U evaluation   Patient to call [facility] should patient have concerns prior to scheduled return appointment. Pain Management Discharge Instructions  General Discharge Instructions :  If you need to reach your doctor call: Monday-Friday 8:00 am - 4:00 pm at [phone] or toll free [phone].  After clinic hours [phone] to have operator reach doctor.  Bring all of your medication bottles to all your appointments in the pain clinic.  To cancel or reschedule your appointment with Pain Management please remember to call 24 hours in advance to avoid a fee.  Refer to the educational materials which you have been given on: General Risks, I had my Procedure. Discharge Instructions, Post Sedation.  Post Procedure Instructions:  The drugs you were given will stay in your system until tomorrow, so for the next 24 hours you should not drive, make any legal decisions or drink any alcoholic beverages.  You may eat anything you prefer, but it is better to start with liquids then soups and crackers, and gradually work up to solid foods.  Please notify your doctor immediately if you have any unusual bleeding, trouble breathing or pain that is not related to your normal pain.  Depending on the type of procedure that was done, some parts of your body may feel week and/or numb.  This usually clears up by tonight or the next day.  Walk with the use of an assistive device or accompanied by an adult for the 24 hours.  You may use ice on the  affected area for the first 24 hours.  Put ice in a Ziploc bag and cover with a towel and place against area 15 minutes on 15 minutes off.  You may switch to heat after 24 hours.GENERAL RISKS AND COMPLICATIONS  What are the risk, side effects and possible complications? Generally speaking, most procedures are safe.  However, with any procedure there are risks, side effects, and the possibility of complications.  The risks and complications are dependent upon the sites that are lesioned, or the type of nerve block to be performed.  The closer the procedure is to the spine, the more serious the risks are.  Great care is taken when placing the radio frequency needles, block needles or lesioning probes, but sometimes complications can occur. 1. Infection: Any time there is an injection through the skin, there is a risk of infection.  This is why sterile conditions are used for these blocks.  There are four possible types of infection. 1. Localized skin infection. 2. Central Nervous System Infection-This can be in the form of Meningitis, which can be deadly. 3. Epidural Infections-This can be in the form of an epidural abscess, which can cause pressure inside of the spine, causing compression of the spinal cord with subsequent paralysis. This would require an emergency surgery to decompress, and there are no guarantees that the patient would recover from the paralysis. 4. Discitis-This is an infection of the intervertebral discs.  It occurs in about 1% of discography procedures.  It is difficult to treat and it may lead to surgery.  2. Pain: the needles have to go through skin and soft tissues, will cause soreness.       3. Damage to internal structures:  The nerves to be lesioned may be near blood vessels or    other nerves which can be potentially damaged.       4. Bleeding: Bleeding is more common if the patient is taking blood thinners such as  aspirin, Coumadin, Ticiid, Plavix, etc., or if he/she  have some genetic predisposition  such as hemophilia. Bleeding into the spinal canal can cause compression of the spinal  cord with subsequent paralysis.  This would require an emergency surgery to  decompress and there are no guarantees that the patient would recover from the  paralysis.       5. Pneumothorax:  Puncturing of a lung is a possibility, every time a needle is introduced in  the area of the chest or upper back.  Pneumothorax refers to free air around the  collapsed lung(s), inside of the thoracic cavity (chest cavity).  Another two possible  complications related to a similar event would include: Hemothorax and Chylothorax.   These are variations of the Pneumothorax, where instead of air around the collapsed  lung(s), you may have blood or chyle, respectively.       6. Spinal headaches: They may occur with any procedures in the area of the spine.       7. Persistent CSF (Cerebro-Spinal Fluid) leakage: This is a rare problem, but may occur  with prolonged intrathecal or epidural catheters either due to the formation of a fistulous  track or a dural tear.       8. Nerve damage: By working so close to the spinal cord, there is always a possibility of  nerve damage, which could be as serious as a permanent spinal cord injury with  paralysis.       9. Death:  Although rare, severe deadly allergic reactions known as "Anaphylactic  reaction" can occur to any of the medications used.      10. Worsening of the symptoms:  We can always make thing worse.  What are the chances of something like this happening? Chances of any of this occuring are extremely low.  By statistics, you have more of a chance of getting killed in a motor vehicle accident: while driving to the hospital than any of the above occurring .  Nevertheless, you should be aware that they are possibilities.  In general, it is similar to taking a shower.  Everybody knows that you can slip, hit your head and get killed.  Does that mean that  you should not shower again?  Nevertheless always keep in mind that statistics do not mean anything if you happen to be on the wrong side of them.  Even if a procedure has a 1 (one) in a 1,000,000 (million) chance of going wrong, it you happen to be that one..Also, keep in mind that by statistics, you have more of a chance of having something go wrong when taking medications.  Who should not have this procedure? If you are on a blood thinning medication (e.g. Coumadin, Plavix, see list of "Blood Thinners"), or if you have an active infection going on, you should not have the procedure.  If you are taking any blood thinners, please inform your physician.  How should I prepare for this procedure?  Do not eat or drink anything at least six hours prior to the procedure.  Bring a driver  with you .  It cannot be a taxi.  Come accompanied by an adult that can drive you back, and that is strong enough to help you if your legs get weak or numb from the local anesthetic.  Take all of your medicines the morning of the procedure with just enough water to swallow them.  If you have diabetes, make sure that you are scheduled to have your procedure done first thing in the morning, whenever possible.  If you have diabetes, take only half of your insulin dose and notify our nurse that you have done so as soon as you arrive at the clinic.  If you are diabetic, but only take blood sugar pills (oral hypoglycemic), then do not take them on the morning of your procedure.  You may take them after you have had the procedure.  Do not take aspirin or any aspirin-containing medications, at least eleven (11) days prior to the procedure.  They may prolong bleeding.  Wear loose fitting clothing that may be easy to take off and that you would not mind if it got stained with Betadine or blood.  Do not wear any jewelry or perfume  Remove any nail coloring.  It will interfere with some of our monitoring equipment.  NOTE:  Remember that this is not meant to be interpreted as a complete list of all possible complications.  Unforeseen problems may occur.  BLOOD THINNERS The following drugs contain aspirin or other products, which can cause increased bleeding during surgery and should not be taken for 2 weeks prior to and 1 week after surgery.  If you should need take something for relief of minor pain, you may take acetaminophen which is found in Tylenol,m Datril, Anacin-3 and Panadol. It is not blood thinner. The products listed below are.  Do not take any of the products listed below in addition to any listed on your instruction sheet.  A.P.C or A.P.C with Codeine Codeine Phosphate Capsules #3 Ibuprofen Ridaura  ABC compound Congesprin Imuran rimadil  Advil Cope Indocin Robaxisal  Alka-Seltzer Effervescent Pain Reliever and Antacid Coricidin or Coricidin-D  Indomethacin Rufen  Alka-Seltzer plus Cold Medicine Cosprin Ketoprofen S-A-C Tablets  Anacin Analgesic Tablets or Capsules Coumadin Korlgesic Salflex  Anacin Extra Strength Analgesic tablets or capsules CP-2 Tablets Lanoril Salicylate  Anaprox Cuprimine Capsules Levenox Salocol  Anexsia-D Dalteparin Magan Salsalate  Anodynos Darvon compound Magnesium Salicylate Sine-off  Ansaid Dasin Capsules Magsal Sodium Salicylate  Anturane Depen Capsules Marnal Soma  APF Arthritis pain formula Dewitt's Pills Measurin Stanback  Argesic Dia-Gesic Meclofenamic Sulfinpyrazone  Arthritis Bayer Timed Release Aspirin Diclofenac Meclomen Sulindac  Arthritis pain formula Anacin Dicumarol Medipren Supac  Analgesic (Safety coated) Arthralgen Diffunasal Mefanamic Suprofen  Arthritis Strength Bufferin Dihydrocodeine Mepro Compound Suprol  Arthropan liquid Dopirydamole Methcarbomol with Aspirin Synalgos  ASA tablets/Enseals Disalcid Micrainin Tagament  Ascriptin Doan's Midol Talwin  Ascriptin A/D Dolene Mobidin Tanderil  Ascriptin Extra Strength Dolobid Moblgesic Ticlid   Ascriptin with Codeine Doloprin or Doloprin with Codeine Momentum Tolectin  Asperbuf Duoprin Mono-gesic Trendar  Aspergum Duradyne Motrin or Motrin IB Triminicin  Aspirin plain, buffered or enteric coated Durasal Myochrisine Trigesic  Aspirin Suppositories Easprin Nalfon Trillsate  Aspirin with Codeine Ecotrin Regular or Extra Strength Naprosyn Uracel  Atromid-S Efficin Naproxen Ursinus  Auranofin Capsules Elmiron Neocylate Vanquish  Axotal Emagrin Norgesic Verin  Azathioprine Empirin or Empirin with Codeine Normiflo Vitamin E  Azolid Emprazil Nuprin Voltaren  Bayer Aspirin plain, buffered or children's or timed BC Tablets or powders  Encaprin Orgaran Warfarin Sodium  Buff-a-Comp Enoxaparin Orudis Zorpin  Buff-a-Comp with Codeine Equegesic Os-Cal-Gesic   Buffaprin Excedrin plain, buffered or Extra Strength Oxalid   Bufferin Arthritis Strength Feldene Oxphenbutazone   Bufferin plain or Extra Strength Feldene Capsules Oxycodone with Aspirin   Bufferin with Codeine Fenoprofen Fenoprofen Pabalate or Pabalate-SF   Buffets II Flogesic Panagesic   Buffinol plain or Extra Strength Florinal or Florinal with Codeine Panwarfarin   Buf-Tabs Flurbiprofen Penicillamine   Butalbital Compound Four-way cold tablets Penicillin   Butazolidin Fragmin Pepto-Bismol   Carbenicillin Geminisyn Percodan   Carna Arthritis Reliever Geopen Persantine   Carprofen Gold's salt Persistin   Chloramphenicol Goody's Phenylbutazone   Chloromycetin Haltrain Piroxlcam   Clmetidine heparin Plaquenil   Cllnoril Hyco-pap Ponstel   Clofibrate Hydroxy chloroquine Propoxyphen         Before stopping any of these medications, be sure to consult the physician who ordered them.  Some, such as Coumadin (Warfarin) are ordered to prevent or treat serious conditions such as "deep thrombosis", "pumonary embolisms", and other heart problems.  The amount of time that you may need off of the medication may also vary with the medication  and the reason for which you were taking it.  If you are taking any of these medications, please make sure you notify your pain physician before you undergo any procedures.         Facet Blocks Patient Information  Description: The facets are joints in the spine between the vertebrae.  Like any joints in the body, facets can become irritated and painful.  Arthritis can also effect the facets.  By injecting steroids and local anesthetic in and around these joints, we can temporarily block the nerve supply to them.  Steroids act directly on irritated nerves and tissues to reduce selling and inflammation which often leads to decreased pain.  Facet blocks may be done anywhere along the spine from the neck to the low back depending upon the location of your pain.   After numbing the skin with local anesthetic (like Novocaine), a small needle is passed onto the facet joints under x-ray guidance.  You may experience a sensation of pressure while this is being done.  The entire block usually lasts about 15-25 minutes.   Conditions which may be treated by facet blocks:   Low back/buttock pain  Neck/shoulder pain  Certain types of headaches  Preparation for the injection:  1. Do not eat any solid food or dairy products within 6 hours of your appointment. 2. You may drink clear liquid up to 2 hours before appointment.  Clear liquids include water, black coffee, juice or soda.  No milk or cream please. 3. You may take your regular medication, including pain medications, with a sip of water before your appointment.  Diabetics should hold regular insulin (if taken separately) and take 1/2 normal NPH dose the morning of the procedure.  Carry some sugar containing items with you to your appointment. 4. A driver must accompany you and be prepared to drive you home after your procedure. 5. Bring all your current medications with you. 6. An IV may be inserted and sedation may be given at the discretion  of the physician. 7. A blood pressure cuff, EKG and other monitors will often be applied during the procedure.  Some patients may need to have extra oxygen administered for a short period. 8. You will be asked to provide medical information, including your allergies and medications, prior to the procedure.  We must know immediately if you are taking blood thinners (like Coumadin/Warfarin) or if you are allergic to IV iodine contrast (dye).  We must know if you could possible be pregnant.  Possible side-effects:   Bleeding from needle site  Infection (rare, may require surgery)  Nerve injury (rare)  Numbness & tingling (temporary)  Difficulty urinating (rare, temporary)  Spinal headache (a headache worse with upright posture)  Light-headedness (temporary)  Pain at injection site (serveral days)  Decreased blood pressure (rare, temporary)  Weakness in arm/leg (temporary)  Pressure sensation in back/neck (temporary)   Call if you experience:   Fever/chills associated with headache or increased back/neck pain  Headache worsened by an upright position  New onset, weakness or numbness of an extremity below the injection site  Hives or difficulty breathing (go to the emergency room)  Inflammation or drainage at the injection site(s)  Severe back/neck pain greater than usual  New symptoms which are concerning to you  Please note:  Although the local anesthetic injected can often make your back or neck feel good for several hours after the injection, the pain will likely return. It takes 3-7 days for steroids to work.  You may not notice any pain relief for at least one week.  If effective, we will often do a series of 2-3 injections spaced 3-6 weeks apart to maximally decrease your pain.  After the initial series, you may be a candidate for a more permanent nerve block of the facets.  If you have any questions, please call #[phone]) [facility]  Pain ClinicFacet Blocks Patient Information  Description: The facets are joints in the spine between the vertebrae.  Like any joints in the body, facets can become irritated and painful.  Arthritis can also effect the facets.  By injecting steroids and local anesthetic in and around these joints, we can temporarily block the nerve supply to them.  Steroids act directly on irritated nerves and tissues to reduce selling and inflammation which often leads to decreased pain.  Facet blocks may be done anywhere along the spine from the neck to the low back depending upon the location of your pain.   After numbing the skin with local anesthetic (like Novocaine), a small needle is passed onto the facet joints under x-ray guidance.  You may experience a sensation of pressure while this is being done.  The entire block usually lasts about 15-25 minutes.   Conditions which may be treated by facet blocks:   Low back/buttock pain  Neck/shoulder pain  Certain types of headaches  Preparation for the injection:  9. Do not eat any solid food or dairy products within 6 hours of your appointment. 10. You may drink clear liquid up to 2 hours before appointment.  Clear liquids include water, black coffee, juice or soda.  No milk or cream please. 11. You may take your regular medication, including pain medications, with a sip of water before your appointment.  Diabetics should hold regular insulin (if taken separately) and take 1/2 normal NPH dose the morning of the procedure.  Carry some sugar containing items with you to your appointment. 12. A driver must accompany you and be prepared to drive you home after your procedure. 40. Bring all your current medications with you. 14. An IV may be inserted and sedation may be given at the discretion of the physician. 15. A blood pressure cuff, EKG and other monitors will often be applied during the procedure.  Some patients  may need to have extra oxygen administered for  a short period. 68. You will be asked to provide medical information, including your allergies and medications, prior to the procedure.  We must know immediately if you are taking blood thinners (like Coumadin/Warfarin) or if you are allergic to IV iodine contrast (dye).  We must know if you could possible be pregnant.  Possible side-effects:   Bleeding from needle site  Infection (rare, may require surgery)  Nerve injury (rare)  Numbness & tingling (temporary)  Difficulty urinating (rare, temporary)  Spinal headache (a headache worse with upright posture)  Light-headedness (temporary)  Pain at injection site (serveral days)  Decreased blood pressure (rare, temporary)  Weakness in arm/leg (temporary)  Pressure sensation in back/neck (temporary)   Call if you experience:   Fever/chills associated with headache or increased back/neck pain  Headache worsened by an upright position  New onset, weakness or numbness of an extremity below the injection site  Hives or difficulty breathing (go to the emergency room)  Inflammation or drainage at the injection site(s)  Severe back/neck pain greater than usual  New symptoms which are concerning to you  Please note:  Although the local anesthetic injected can often make your back or neck feel good for several hours after the injection, the pain will likely return. It takes 3-7 days for steroids to work.  You may not notice any pain relief for at least one week.  If effective, we will often do a series of 2-3 injections spaced 3-6 weeks apart to maximally decrease your pain.  After the initial series, you may be a candidate for a more permanent nerve block of the facets.  If you have any questions, please call #[phone]) [facility]

## 2015-02-19 ENCOUNTER — Other Ambulatory Visit: Payer: Self-pay | Admitting: Pain Medicine

## 2015-03-02 ENCOUNTER — Ambulatory Visit: Payer: Medicaid Other | Admitting: Pain Medicine

## 2015-03-09 ENCOUNTER — Telehealth: Payer: Self-pay | Admitting: *Deleted

## 2015-03-09 ENCOUNTER — Telehealth: Payer: Self-pay | Admitting: Pain Medicine

## 2015-03-09 ENCOUNTER — Ambulatory Visit: Payer: Medicaid Other | Admitting: Pain Medicine

## 2015-03-09 NOTE — Telephone Encounter (Signed)
Sick / needs to resched

## 2015-03-10 ENCOUNTER — Ambulatory Visit: Payer: Medicaid Other | Attending: Pain Medicine | Admitting: Pain Medicine

## 2015-03-10 ENCOUNTER — Encounter: Payer: Self-pay | Admitting: Pain Medicine

## 2015-03-10 VITALS — BP 119/68 | HR 89 | Temp 98.7°F | Resp 16 | Ht 65.0 in | Wt 180.0 lb

## 2015-03-10 DIAGNOSIS — M47816 Spondylosis without myelopathy or radiculopathy, lumbar region: Secondary | ICD-10-CM | POA: Insufficient documentation

## 2015-03-10 DIAGNOSIS — M79605 Pain in left leg: Secondary | ICD-10-CM | POA: Diagnosis present

## 2015-03-10 DIAGNOSIS — M5137 Other intervertebral disc degeneration, lumbosacral region: Secondary | ICD-10-CM

## 2015-03-10 DIAGNOSIS — M533 Sacrococcygeal disorders, not elsewhere classified: Secondary | ICD-10-CM | POA: Insufficient documentation

## 2015-03-10 DIAGNOSIS — M1288 Other specific arthropathies, not elsewhere classified, other specified site: Secondary | ICD-10-CM | POA: Insufficient documentation

## 2015-03-10 DIAGNOSIS — M4806 Spinal stenosis, lumbar region: Secondary | ICD-10-CM | POA: Diagnosis not present

## 2015-03-10 DIAGNOSIS — M5136 Other intervertebral disc degeneration, lumbar region: Secondary | ICD-10-CM | POA: Diagnosis not present

## 2015-03-10 DIAGNOSIS — M5416 Radiculopathy, lumbar region: Secondary | ICD-10-CM

## 2015-03-10 DIAGNOSIS — M545 Low back pain: Secondary | ICD-10-CM | POA: Diagnosis present

## 2015-03-10 DIAGNOSIS — M48062 Spinal stenosis, lumbar region with neurogenic claudication: Secondary | ICD-10-CM

## 2015-03-10 DIAGNOSIS — G588 Other specified mononeuropathies: Secondary | ICD-10-CM

## 2015-03-10 DIAGNOSIS — M79604 Pain in right leg: Secondary | ICD-10-CM | POA: Diagnosis present

## 2015-03-10 MED ORDER — OXYCODONE HCL 5 MG PO TABS: ORAL_TABLET | ORAL | Status: DC

## 2015-03-10 NOTE — Patient Instructions (Addendum)
PLAN   Continue present medication oxycodone  Lumbar facet, medial branch nerve, blocks to be performed at time of return appointment  F/U PCP Dr. Posey Pronto and Elsworth Soho for evaliation of  BP and general medical  condition  F/U surgical evaluation. Follow-up with Dr. Trenton Gammon as discussed. Proceed with neurosurgical evaluation as discussed  F/U neurological evaluation. Follow-up with Dr. Melrose Nakayama as discussed  May consider radiofrequency rhizolysis or intraspinal procedures pending response to present treatment and F/U evaluation   Patient to call [facility] should patient have concerns prior to scheduled return appointment. GENERAL RISKS AND COMPLICATIONS  What are the risk, side effects and possible complications? Generally speaking, most procedures are safe.  However, with any procedure there are risks, side effects, and the possibility of complications.  The risks and complications are dependent upon the sites that are lesioned, or the type of nerve block to be performed.  The closer the procedure is to the spine, the more serious the risks are.  Great care is taken when placing the radio frequency needles, block needles or lesioning probes, but sometimes complications can occur. 1. Infection: Any time there is an injection through the skin, there is a risk of infection.  This is why sterile conditions are used for these blocks.  There are four possible types of infection. 1. Localized skin infection. 2. Central Nervous System Infection-This can be in the form of Meningitis, which can be deadly. 3. Epidural Infections-This can be in the form of an epidural abscess, which can cause pressure inside of the spine, causing compression of the spinal cord with subsequent paralysis. This would require an emergency surgery to decompress, and there are no guarantees that the patient would recover from the paralysis. 4. Discitis-This is an infection of the intervertebral discs.  It occurs in about  1% of discography procedures.  It is difficult to treat and it may lead to surgery.        2. Pain: the needles have to go through skin and soft tissues, will cause soreness.       3. Damage to internal structures:  The nerves to be lesioned may be near blood vessels or    other nerves which can be potentially damaged.       4. Bleeding: Bleeding is more common if the patient is taking blood thinners such as  aspirin, Coumadin, Ticiid, Plavix, etc., or if he/she have some genetic predisposition  such as hemophilia. Bleeding into the spinal canal can cause compression of the spinal  cord with subsequent paralysis.  This would require an emergency surgery to  decompress and there are no guarantees that the patient would recover from the  paralysis.       5. Pneumothorax:  Puncturing of a lung is a possibility, every time a needle is introduced in  the area of the chest or upper back.  Pneumothorax refers to free air around the  collapsed lung(s), inside of the thoracic cavity (chest cavity).  Another two possible  complications related to a similar event would include: Hemothorax and Chylothorax.   These are variations of the Pneumothorax, where instead of air around the collapsed  lung(s), you may have blood or chyle, respectively.       6. Spinal headaches: They may occur with any procedures in the area of the spine.       7. Persistent CSF (Cerebro-Spinal Fluid) leakage: This is a rare problem, but may occur  with prolonged intrathecal or epidural catheters either  due to the formation of a fistulous  track or a dural tear.       8. Nerve damage: By working so close to the spinal cord, there is always a possibility of  nerve damage, which could be as serious as a permanent spinal cord injury with  paralysis.       9. Death:  Although rare, severe deadly allergic reactions known as "Anaphylactic  reaction" can occur to any of the medications used.      10. Worsening of the symptoms:  We can always make  thing worse.  What are the chances of something like this happening? Chances of any of this occuring are extremely low.  By statistics, you have more of a chance of getting killed in a motor vehicle accident: while driving to the hospital than any of the above occurring .  Nevertheless, you should be aware that they are possibilities.  In general, it is similar to taking a shower.  Everybody knows that you can slip, hit your head and get killed.  Does that mean that you should not shower again?  Nevertheless always keep in mind that statistics do not mean anything if you happen to be on the wrong side of them.  Even if a procedure has a 1 (one) in a 1,000,000 (million) chance of going wrong, it you happen to be that one..Also, keep in mind that by statistics, you have more of a chance of having something go wrong when taking medications.  Who should not have this procedure? If you are on a blood thinning medication (e.g. Coumadin, Plavix, see list of "Blood Thinners"), or if you have an active infection going on, you should not have the procedure.  If you are taking any blood thinners, please inform your physician.  How should I prepare for this procedure?  Do not eat or drink anything at least six hours prior to the procedure.  Bring a driver with you .  It cannot be a taxi.  Come accompanied by an adult that can drive you back, and that is strong enough to help you if your legs get weak or numb from the local anesthetic.  Take all of your medicines the morning of the procedure with just enough water to swallow them.  If you have diabetes, make sure that you are scheduled to have your procedure done first thing in the morning, whenever possible.  If you have diabetes, take only half of your insulin dose and notify our nurse that you have done so as soon as you arrive at the clinic.  If you are diabetic, but only take blood sugar pills (oral hypoglycemic), then do not take them on the morning  of your procedure.  You may take them after you have had the procedure.  Do not take aspirin or any aspirin-containing medications, at least eleven (11) days prior to the procedure.  They may prolong bleeding.  Wear loose fitting clothing that may be easy to take off and that you would not mind if it got stained with Betadine or blood.  Do not wear any jewelry or perfume  Remove any nail coloring.  It will interfere with some of our monitoring equipment.  NOTE: Remember that this is not meant to be interpreted as a complete list of all possible complications.  Unforeseen problems may occur.  BLOOD THINNERS The following drugs contain aspirin or other products, which can cause increased bleeding during surgery and should not be taken for 2  weeks prior to and 1 week after surgery.  If you should need take something for relief of minor pain, you may take acetaminophen which is found in Tylenol,m Datril, Anacin-3 and Panadol. It is not blood thinner. The products listed below are.  Do not take any of the products listed below in addition to any listed on your instruction sheet.  A.P.C or A.P.C with Codeine Codeine Phosphate Capsules #3 Ibuprofen Ridaura  ABC compound Congesprin Imuran rimadil  Advil Cope Indocin Robaxisal  Alka-Seltzer Effervescent Pain Reliever and Antacid Coricidin or Coricidin-D  Indomethacin Rufen  Alka-Seltzer plus Cold Medicine Cosprin Ketoprofen S-A-C Tablets  Anacin Analgesic Tablets or Capsules Coumadin Korlgesic Salflex  Anacin Extra Strength Analgesic tablets or capsules CP-2 Tablets Lanoril Salicylate  Anaprox Cuprimine Capsules Levenox Salocol  Anexsia-D Dalteparin Magan Salsalate  Anodynos Darvon compound Magnesium Salicylate Sine-off  Ansaid Dasin Capsules Magsal Sodium Salicylate  Anturane Depen Capsules Marnal Soma  APF Arthritis pain formula Dewitt's Pills Measurin Stanback  Argesic Dia-Gesic Meclofenamic Sulfinpyrazone  Arthritis Bayer Timed Release  Aspirin Diclofenac Meclomen Sulindac  Arthritis pain formula Anacin Dicumarol Medipren Supac  Analgesic (Safety coated) Arthralgen Diffunasal Mefanamic Suprofen  Arthritis Strength Bufferin Dihydrocodeine Mepro Compound Suprol  Arthropan liquid Dopirydamole Methcarbomol with Aspirin Synalgos  ASA tablets/Enseals Disalcid Micrainin Tagament  Ascriptin Doan's Midol Talwin  Ascriptin A/D Dolene Mobidin Tanderil  Ascriptin Extra Strength Dolobid Moblgesic Ticlid  Ascriptin with Codeine Doloprin or Doloprin with Codeine Momentum Tolectin  Asperbuf Duoprin Mono-gesic Trendar  Aspergum Duradyne Motrin or Motrin IB Triminicin  Aspirin plain, buffered or enteric coated Durasal Myochrisine Trigesic  Aspirin Suppositories Easprin Nalfon Trillsate  Aspirin with Codeine Ecotrin Regular or Extra Strength Naprosyn Uracel  Atromid-S Efficin Naproxen Ursinus  Auranofin Capsules Elmiron Neocylate Vanquish  Axotal Emagrin Norgesic Verin  Azathioprine Empirin or Empirin with Codeine Normiflo Vitamin E  Azolid Emprazil Nuprin Voltaren  Bayer Aspirin plain, buffered or children's or timed BC Tablets or powders Encaprin Orgaran Warfarin Sodium  Buff-a-Comp Enoxaparin Orudis Zorpin  Buff-a-Comp with Codeine Equegesic Os-Cal-Gesic   Buffaprin Excedrin plain, buffered or Extra Strength Oxalid   Bufferin Arthritis Strength Feldene Oxphenbutazone   Bufferin plain or Extra Strength Feldene Capsules Oxycodone with Aspirin   Bufferin with Codeine Fenoprofen Fenoprofen Pabalate or Pabalate-SF   Buffets II Flogesic Panagesic   Buffinol plain or Extra Strength Florinal or Florinal with Codeine Panwarfarin   Buf-Tabs Flurbiprofen Penicillamine   Butalbital Compound Four-way cold tablets Penicillin   Butazolidin Fragmin Pepto-Bismol   Carbenicillin Geminisyn Percodan   Carna Arthritis Reliever Geopen Persantine   Carprofen Gold's salt Persistin   Chloramphenicol Goody's Phenylbutazone   Chloromycetin Haltrain  Piroxlcam   Clmetidine heparin Plaquenil   Cllnoril Hyco-pap Ponstel   Clofibrate Hydroxy chloroquine Propoxyphen         Before stopping any of these medications, be sure to consult the physician who ordered them.  Some, such as Coumadin (Warfarin) are ordered to prevent or treat serious conditions such as "deep thrombosis", "pumonary embolisms", and other heart problems.  The amount of time that you may need off of the medication may also vary with the medication and the reason for which you were taking it.  If you are taking any of these medications, please make sure you notify your pain physician before you undergo any procedures.         Facet Blocks Patient Information  Description: The facets are joints in the spine between the vertebrae.  Like any joints in the body, facets can  become irritated and painful.  Arthritis can also effect the facets.  By injecting steroids and local anesthetic in and around these joints, we can temporarily block the nerve supply to them.  Steroids act directly on irritated nerves and tissues to reduce selling and inflammation which often leads to decreased pain.  Facet blocks may be done anywhere along the spine from the neck to the low back depending upon the location of your pain.   After numbing the skin with local anesthetic (like Novocaine), a small needle is passed onto the facet joints under x-ray guidance.  You may experience a sensation of pressure while this is being done.  The entire block usually lasts about 15-25 minutes.   Conditions which may be treated by facet blocks:   Low back/buttock pain  Neck/shoulder pain  Certain types of headaches  Preparation for the injection:  1. Do not eat any solid food or dairy products within 6 hours of your appointment. 2. You may drink clear liquid up to 2 hours before appointment.  Clear liquids include water, black coffee, juice or soda.  No milk or cream please. 3. You may take your regular  medication, including pain medications, with a sip of water before your appointment.  Diabetics should hold regular insulin (if taken separately) and take 1/2 normal NPH dose the morning of the procedure.  Carry some sugar containing items with you to your appointment. 4. A driver must accompany you and be prepared to drive you home after your procedure. 5. Bring all your current medications with you. 6. An IV may be inserted and sedation may be given at the discretion of the physician. 7. A blood pressure cuff, EKG and other monitors will often be applied during the procedure.  Some patients may need to have extra oxygen administered for a short period. 8. You will be asked to provide medical information, including your allergies and medications, prior to the procedure.  We must know immediately if you are taking blood thinners (like Coumadin/Warfarin) or if you are allergic to IV iodine contrast (dye).  We must know if you could possible be pregnant.  Possible side-effects:   Bleeding from needle site  Infection (rare, may require surgery)  Nerve injury (rare)  Numbness & tingling (temporary)  Difficulty urinating (rare, temporary)  Spinal headache (a headache worse with upright posture)  Light-headedness (temporary)  Pain at injection site (serveral days)  Decreased blood pressure (rare, temporary)  Weakness in arm/leg (temporary)  Pressure sensation in back/neck (temporary)   Call if you experience:   Fever/chills associated with headache or increased back/neck pain  Headache worsened by an upright position  New onset, weakness or numbness of an extremity below the injection site  Hives or difficulty breathing (go to the emergency room)  Inflammation or drainage at the injection site(s)  Severe back/neck pain greater than usual  New symptoms which are concerning to you  Please note:  Although the local anesthetic injected can often make your back or neck feel  good for several hours after the injection, the pain will likely return. It takes 3-7 days for steroids to work.  You may not notice any pain relief for at least one week.  If effective, we will often do a series of 2-3 injections spaced 3-6 weeks apart to maximally decrease your pain.  After the initial series, you may be a candidate for a more permanent nerve block of the facets.  If you have any questions, please call #[phone]  [facility]

## 2015-03-10 NOTE — Progress Notes (Signed)
Subjective:    Patient ID: Gina Costa, female    DOB: 28-Jan-1961, 54 y.o.   MRN: [id number]  HPI  Patient is 54 year old female returns to [place] for further evaluation and treatment of pain involving the lower back and lower extremity region. Patient states that the pain is associated with numbness and tingling of the left lower extremity which is aggravated with standing and walking. Patient denies any trauma change in events of daily living the call significant change in symptoms pathology. We discussed patient's condition and will proceed with lumbar facet, medial branch nerve, blocks at time return appointment. Patient will follow-up with Dr. Trenton Gammon as discussed and will also discuss further neurosurgical evaluation with Dr. Posey Pronto and Elsworth Soho. Follow-up Dr. Melrose Nakayama to discuss patient's inability to tolerate Lyrica. Patient states that she has resumed taking Neurontin which also has effect on her memory. Patient will follow-up Dr. Melrose Nakayama to discuss replacing Lyrica and Neurontin and considering patient for another medication which may be without undesirable side effects. We will proceed with lumbar facet, medial branch nerve, locks which is felt to be medically necessary procedure in attempt to decrease severity of patient's symptoms, minimize aggression of patient's symptoms, and avoid need for more involved treatment. The patient was with understanding and in agreement with suggested treatment plan.  Review of Systems     Objective:   Physical Exam  There was tenderness of the splenius capitis and occipitalis musculature region a minimal degree. There was minimal tenderness over the cervical facet cervical paraspinal musculature region. There appeared to be minimal tenderness of the acromioclavicular glenohumeral joint region. Patient appeared to be with unremarkable Spurling's maneuver. Palpation over the thoracic facet thoracic paraspinal musculature region was with minimal  tenderness to palpation as well palpation over the lower thoracic paraspinal musculature region was with moderate tends to palpation with evidence of moderate muscle spasms of the left as well as on the right. There was no crepitus of the thoracic region noted. Tinel and Phalen's maneuver were without increased pain of significant degree and patient appeared to be with bilaterally equal grip strength. Palpation over the lumbar paraspinal musculature region lumbar facet region associated with moderately severe discomfort. Lateral bending rotation and extension and palpation of the lumbar facets reproduce moderately severe discomfort. There was tennis over the PSIS and PII S region a moderate degree as well with straight leg raising tolerates approximately 20 without increased pain with dorsiflexion noted. There was negative clonus negative Homans. DTRs were difficult to elicit patient had difficulty relaxing. There was question decreased sensation along the L5 dermatomal distribution detected. There was minimal tenderness of the greater trochanteric region and iliotibial band region. Abdomen was nontender with no costovertebral angle tenderness noted.    Assessment & Plan:    Degenerative disc disease lumbar spine Multilevel degenerative changes lumbar spine asymmetric edema of the right facet joint, L5-S1 level most involved, some progression since prior exam, multilevel facet arthropathy L2-3, L3-4, L4-5, and L5-S1. Disc bulging and stenosis noted on lumbar MRI  Lumbar facet syndrome  Lumbar stenosis with neurogenic claudication  Sacroiliac joint dysfunction    PLAN   Continue present medication oxycodone. Patient will return to Dr. Melrose Nakayama to discuss replacing Neurontin and Lyrica with a similar medication since with Neurontin and Lyrica have undesirable side effects. At the present time patient is taking Neurontin which she tolerates better than Lyrica and patient wishes to replace both  Neurontin and Lyrica due to undesirable side effects caused by  both medications  Lumbar facet, medial branch nerve, blocks to be performed at time return appointment  F/U PCP Dr. Posey Pronto and Billy Coast for evaliation of  BP and general medical  condition  F/U surgical evaluation. Patient will follow-up with Dr. Trenton Gammon as discussed and will discuss further neurosurgical evaluation with Dr. Posey Pronto and Elsworth Soho  F/U neurological evaluation. Patient will follow-up with Dr. Melrose Nakayama to discuss replacing both Neurontin and Lyrica with the medication and similar class and without side effects of Neurontin and Lyrica  May consider radiofrequency rhizolysis or intraspinal procedures pending response to present treatment and F/U evaluation   Patient to call [facility] should patient have concerns prior to scheduled return appointment.

## 2015-03-10 NOTE — Progress Notes (Signed)
Safety precautions to be maintained throughout the outpatient stay will include: orient to surroundings, keep bed in low position, maintain call bell within reach at all times, provide assistance with transfer out of bed and ambulation.  

## 2015-03-16 ENCOUNTER — Ambulatory Visit: Payer: Medicaid Other | Attending: Pain Medicine | Admitting: Pain Medicine

## 2015-03-16 ENCOUNTER — Encounter: Payer: Self-pay | Admitting: Pain Medicine

## 2015-03-16 VITALS — BP 117/65 | HR 81 | Temp 98.4°F | Resp 17 | Ht 64.0 in | Wt 184.0 lb

## 2015-03-16 DIAGNOSIS — M5416 Radiculopathy, lumbar region: Secondary | ICD-10-CM

## 2015-03-16 DIAGNOSIS — G588 Other specified mononeuropathies: Secondary | ICD-10-CM

## 2015-03-16 DIAGNOSIS — M79606 Pain in leg, unspecified: Secondary | ICD-10-CM | POA: Insufficient documentation

## 2015-03-16 DIAGNOSIS — M48062 Spinal stenosis, lumbar region with neurogenic claudication: Secondary | ICD-10-CM

## 2015-03-16 DIAGNOSIS — M545 Low back pain: Secondary | ICD-10-CM | POA: Diagnosis not present

## 2015-03-16 DIAGNOSIS — M533 Sacrococcygeal disorders, not elsewhere classified: Secondary | ICD-10-CM

## 2015-03-16 DIAGNOSIS — M1288 Other specific arthropathies, not elsewhere classified, other specified site: Secondary | ICD-10-CM | POA: Diagnosis not present

## 2015-03-16 DIAGNOSIS — M5136 Other intervertebral disc degeneration, lumbar region: Secondary | ICD-10-CM | POA: Insufficient documentation

## 2015-03-16 DIAGNOSIS — M47816 Spondylosis without myelopathy or radiculopathy, lumbar region: Secondary | ICD-10-CM

## 2015-03-16 MED ORDER — TRIAMCINOLONE ACETONIDE 40 MG/ML IJ SUSP
40.0000 mg | Freq: Once | INTRAMUSCULAR | Status: AC
  Administered 2015-03-16: 40 mg

## 2015-03-16 MED ORDER — FENTANYL CITRATE (PF) 100 MCG/2ML IJ SOLN
100.0000 ug | Freq: Once | INTRAMUSCULAR | Status: AC
  Administered 2015-03-16: 100 ug via INTRAVENOUS

## 2015-03-16 MED ORDER — BUPIVACAINE HCL (PF) 0.25 % IJ SOLN
30.0000 mL | Freq: Once | INTRAMUSCULAR | Status: AC
  Administered 2015-03-16: 20 mL

## 2015-03-16 MED ORDER — ORPHENADRINE CITRATE 30 MG/ML IJ SOLN
INTRAMUSCULAR | Status: AC
  Filled 2015-03-16: qty 2

## 2015-03-16 MED ORDER — LACTATED RINGERS IV SOLN: 1000.0000 mL | INTRAVENOUS | Status: DC

## 2015-03-16 MED ORDER — TRIAMCINOLONE ACETONIDE 40 MG/ML IJ SUSP
INTRAMUSCULAR | Status: AC
  Administered 2015-03-16: 40 mg
  Filled 2015-03-16: qty 1

## 2015-03-16 MED ORDER — LIDOCAINE HCL (PF) 1 % IJ SOLN: 10.0000 mL | Freq: Once | INTRAMUSCULAR | Status: DC

## 2015-03-16 MED ORDER — FENTANYL CITRATE (PF) 100 MCG/2ML IJ SOLN
INTRAMUSCULAR | Status: AC
  Administered 2015-03-16: 100 ug via INTRAVENOUS
  Filled 2015-03-16: qty 2

## 2015-03-16 MED ORDER — MIDAZOLAM HCL 5 MG/5ML IJ SOLN
5.0000 mg | Freq: Once | INTRAMUSCULAR | Status: AC
  Administered 2015-03-16: 3 mg via INTRAVENOUS

## 2015-03-16 MED ORDER — MIDAZOLAM HCL 5 MG/5ML IJ SOLN
INTRAMUSCULAR | Status: AC
  Administered 2015-03-16: 3 mg via INTRAVENOUS
  Filled 2015-03-16: qty 5

## 2015-03-16 MED ORDER — BUPIVACAINE HCL (PF) 0.25 % IJ SOLN
INTRAMUSCULAR | Status: AC
  Administered 2015-03-16: 20 mL
  Filled 2015-03-16: qty 30

## 2015-03-16 MED ORDER — ORPHENADRINE CITRATE 30 MG/ML IJ SOLN: 60.0000 mg | Freq: Once | INTRAMUSCULAR | Status: DC

## 2015-03-16 NOTE — Patient Instructions (Addendum)
Continue present medication oxycodone  F/U PCP Dr. Posey Pronto and Elsworth Soho for evaliation of  BP and general medical  condition  F/U surgical evaluation. Neurosurgical reevaluation as discussed  F/U neurological evaluation. Follow-up as discussed  May consider radiofrequency rhizolysis or intraspinal procedures pending response to present treatment and F/U evaluation Pain Management Discharge Instructions  General Discharge Instructions :  If you need to reach your doctor call: Monday-Friday 8:00 am - 4:00 pm at [phone] or toll free [phone].  After clinic hours [phone] to have operator reach doctor.  Bring all of your medication bottles to all your appointments in the pain clinic.  To cancel or reschedule your appointment with Pain Management please remember to call 24 hours in advance to avoid a fee.  Refer to the educational materials which you have been given on: General Risks, I had my Procedure. Discharge Instructions, Post Sedation.  Post Procedure Instructions:  The drugs you were given will stay in your system until tomorrow, so for the next 24 hours you should not drive, make any legal decisions or drink any alcoholic beverages.  You may eat anything you prefer, but it is better to start with liquids then soups and crackers, and gradually work up to solid foods.  Please notify your doctor immediately if you have any unusual bleeding, trouble breathing or pain that is not related to your normal pain.  Depending on the type of procedure that was done, some parts of your body may feel week and/or numb.  This usually clears up by tonight or the next day.  Walk with the use of an assistive device or accompanied by an adult for the 24 hours.  You may use ice on the affected area for the first 24 hours.  Put ice in a Ziploc bag and cover with a towel and place against area 15 minutes on 15 minutes off.  You may switch to heat after 24 hours.GENERAL RISKS AND  COMPLICATIONS  What are the risk, side effects and possible complications? Generally speaking, most procedures are safe.  However, with any procedure there are risks, side effects, and the possibility of complications.  The risks and complications are dependent upon the sites that are lesioned, or the type of nerve block to be performed.  The closer the procedure is to the spine, the more serious the risks are.  Great care is taken when placing the radio frequency needles, block needles or lesioning probes, but sometimes complications can occur. 1. Infection: Any time there is an injection through the skin, there is a risk of infection.  This is why sterile conditions are used for these blocks.  There are four possible types of infection. 1. Localized skin infection. 2. Central Nervous System Infection-This can be in the form of Meningitis, which can be deadly. 3. Epidural Infections-This can be in the form of an epidural abscess, which can cause pressure inside of the spine, causing compression of the spinal cord with subsequent paralysis. This would require an emergency surgery to decompress, and there are no guarantees that the patient would recover from the paralysis. 4. Discitis-This is an infection of the intervertebral discs.  It occurs in about 1% of discography procedures.  It is difficult to treat and it may lead to surgery.        2. Pain: the needles have to go through skin and soft tissues, will cause soreness.       3. Damage to internal structures:  The nerves to be lesioned may  be near blood vessels or    other nerves which can be potentially damaged.       4. Bleeding: Bleeding is more common if the patient is taking blood thinners such as  aspirin, Coumadin, Ticiid, Plavix, etc., or if he/she have some genetic predisposition  such as hemophilia. Bleeding into the spinal canal can cause compression of the spinal  cord with subsequent paralysis.  This would require an emergency surgery  to  decompress and there are no guarantees that the patient would recover from the  paralysis.       5. Pneumothorax:  Puncturing of a lung is a possibility, every time a needle is introduced in  the area of the chest or upper back.  Pneumothorax refers to free air around the  collapsed lung(s), inside of the thoracic cavity (chest cavity).  Another two possible  complications related to a similar event would include: Hemothorax and Chylothorax.   These are variations of the Pneumothorax, where instead of air around the collapsed  lung(s), you may have blood or chyle, respectively.       6. Spinal headaches: They may occur with any procedures in the area of the spine.       7. Persistent CSF (Cerebro-Spinal Fluid) leakage: This is a rare problem, but may occur  with prolonged intrathecal or epidural catheters either due to the formation of a fistulous  track or a dural tear.       8. Nerve damage: By working so close to the spinal cord, there is always a possibility of  nerve damage, which could be as serious as a permanent spinal cord injury with  paralysis.       9. Death:  Although rare, severe deadly allergic reactions known as "Anaphylactic  reaction" can occur to any of the medications used.      10. Worsening of the symptoms:  We can always make thing worse.  What are the chances of something like this happening? Chances of any of this occuring are extremely low.  By statistics, you have more of a chance of getting killed in a motor vehicle accident: while driving to the hospital than any of the above occurring .  Nevertheless, you should be aware that they are possibilities.  In general, it is similar to taking a shower.  Everybody knows that you can slip, hit your head and get killed.  Does that mean that you should not shower again?  Nevertheless always keep in mind that statistics do not mean anything if you happen to be on the wrong side of them.  Even if a procedure has a 1 (one) in a 1,000,000  (million) chance of going wrong, it you happen to be that one..Also, keep in mind that by statistics, you have more of a chance of having something go wrong when taking medications.  Who should not have this procedure? If you are on a blood thinning medication (e.g. Coumadin, Plavix, see list of "Blood Thinners"), or if you have an active infection going on, you should not have the procedure.  If you are taking any blood thinners, please inform your physician.  How should I prepare for this procedure?  Do not eat or drink anything at least six hours prior to the procedure.  Bring a driver with you .  It cannot be a taxi.  Come accompanied by an adult that can drive you back, and that is strong enough to help you if your legs get weak  or numb from the local anesthetic.  Take all of your medicines the morning of the procedure with just enough water to swallow them.  If you have diabetes, make sure that you are scheduled to have your procedure done first thing in the morning, whenever possible.  If you have diabetes, take only half of your insulin dose and notify our nurse that you have done so as soon as you arrive at the clinic.  If you are diabetic, but only take blood sugar pills (oral hypoglycemic), then do not take them on the morning of your procedure.  You may take them after you have had the procedure.  Do not take aspirin or any aspirin-containing medications, at least eleven (11) days prior to the procedure.  They may prolong bleeding.  Wear loose fitting clothing that may be easy to take off and that you would not mind if it got stained with Betadine or blood.  Do not wear any jewelry or perfume  Remove any nail coloring.  It will interfere with some of our monitoring equipment.  NOTE: Remember that this is not meant to be interpreted as a complete list of all possible complications.  Unforeseen problems may occur.  BLOOD THINNERS The following drugs contain aspirin or other  products, which can cause increased bleeding during surgery and should not be taken for 2 weeks prior to and 1 week after surgery.  If you should need take something for relief of minor pain, you may take acetaminophen which is found in Tylenol,m Datril, Anacin-3 and Panadol. It is not blood thinner. The products listed below are.  Do not take any of the products listed below in addition to any listed on your instruction sheet.  A.P.C or A.P.C with Codeine Codeine Phosphate Capsules #3 Ibuprofen Ridaura  ABC compound Congesprin Imuran rimadil  Advil Cope Indocin Robaxisal  Alka-Seltzer Effervescent Pain Reliever and Antacid Coricidin or Coricidin-D  Indomethacin Rufen  Alka-Seltzer plus Cold Medicine Cosprin Ketoprofen S-A-C Tablets  Anacin Analgesic Tablets or Capsules Coumadin Korlgesic Salflex  Anacin Extra Strength Analgesic tablets or capsules CP-2 Tablets Lanoril Salicylate  Anaprox Cuprimine Capsules Levenox Salocol  Anexsia-D Dalteparin Magan Salsalate  Anodynos Darvon compound Magnesium Salicylate Sine-off  Ansaid Dasin Capsules Magsal Sodium Salicylate  Anturane Depen Capsules Marnal Soma  APF Arthritis pain formula Dewitt's Pills Measurin Stanback  Argesic Dia-Gesic Meclofenamic Sulfinpyrazone  Arthritis Bayer Timed Release Aspirin Diclofenac Meclomen Sulindac  Arthritis pain formula Anacin Dicumarol Medipren Supac  Analgesic (Safety coated) Arthralgen Diffunasal Mefanamic Suprofen  Arthritis Strength Bufferin Dihydrocodeine Mepro Compound Suprol  Arthropan liquid Dopirydamole Methcarbomol with Aspirin Synalgos  ASA tablets/Enseals Disalcid Micrainin Tagament  Ascriptin Doan's Midol Talwin  Ascriptin A/D Dolene Mobidin Tanderil  Ascriptin Extra Strength Dolobid Moblgesic Ticlid  Ascriptin with Codeine Doloprin or Doloprin with Codeine Momentum Tolectin  Asperbuf Duoprin Mono-gesic Trendar  Aspergum Duradyne Motrin or Motrin IB Triminicin  Aspirin plain, buffered or enteric  coated Durasal Myochrisine Trigesic  Aspirin Suppositories Easprin Nalfon Trillsate  Aspirin with Codeine Ecotrin Regular or Extra Strength Naprosyn Uracel  Atromid-S Efficin Naproxen Ursinus  Auranofin Capsules Elmiron Neocylate Vanquish  Axotal Emagrin Norgesic Verin  Azathioprine Empirin or Empirin with Codeine Normiflo Vitamin E  Azolid Emprazil Nuprin Voltaren  Bayer Aspirin plain, buffered or children's or timed BC Tablets or powders Encaprin Orgaran Warfarin Sodium  Buff-a-Comp Enoxaparin Orudis Zorpin  Buff-a-Comp with Codeine Equegesic Os-Cal-Gesic   Buffaprin Excedrin plain, buffered or Extra Strength Oxalid   Bufferin Arthritis Strength Feldene Oxphenbutazone  Bufferin plain or Extra Strength Feldene Capsules Oxycodone with Aspirin   Bufferin with Codeine Fenoprofen Fenoprofen Pabalate or Pabalate-SF   Buffets II Flogesic Panagesic   Buffinol plain or Extra Strength Florinal or Florinal with Codeine Panwarfarin   Buf-Tabs Flurbiprofen Penicillamine   Butalbital Compound Four-way cold tablets Penicillin   Butazolidin Fragmin Pepto-Bismol   Carbenicillin Geminisyn Percodan   Carna Arthritis Reliever Geopen Persantine   Carprofen Gold's salt Persistin   Chloramphenicol Goody's Phenylbutazone   Chloromycetin Haltrain Piroxlcam   Clmetidine heparin Plaquenil   Cllnoril Hyco-pap Ponstel   Clofibrate Hydroxy chloroquine Propoxyphen         Before stopping any of these medications, be sure to consult the physician who ordered them.  Some, such as Coumadin (Warfarin) are ordered to prevent or treat serious conditions such as "deep thrombosis", "pumonary embolisms", and other heart problems.  The amount of time that you may need off of the medication may also vary with the medication and the reason for which you were taking it.  If you are taking any of these medications, please make sure you notify your pain physician before you undergo any procedures.

## 2015-03-16 NOTE — Progress Notes (Signed)
Safety precautions to be maintained throughout the outpatient stay will include: orient to surroundings, keep bed in low position, maintain call bell within reach at all times, provide assistance with transfer out of bed and ambulation.  

## 2015-03-16 NOTE — Progress Notes (Signed)
Subjective:    Patient ID: Gina Costa, female    DOB: [birth year], 54 y.o.   MRN: [id number]  HPI  PROCEDURE PERFORMED: Lumbar facet (medial branch block)   NOTE: The patient is a 54 y.o. female who returns to [facility] for further evaluation and treatment of pain involving the lumbar and lower extremity region.  MRI  revealed the patient to be with evidence of degenerative disc disease lumbar spine Multilevel degenerative changes lumbar spine asymmetric edema of the right facet joint, L5-S1 level most involved, some progression since prior exam, multilevel facet arthropathy L2-3, L3-4, L4-5, and L5-S1. Disc bulging and stenosis noted on lumbar MRI.  There is concern regarding significant component of patient's pain being due to degenerative changes of the lumbar spine with significant component of pain due to facet arthropathy with facet syndromeThe risks, benefits, and expectations of the procedure have been discussed and explained to the patient who was understanding and in agreement with suggested treatment plan. We will proceed with interventional treatment as discussed and as explained to the patient who was understanding and wished to proceed with procedure as planned.   DESCRIPTION OF PROCEDURE: Lumbar facet (medial branch block) with IV Versed, IV fentanyl conscious sedation, EKG, blood pressure, pulse, and pulse oximetry monitoring. The procedure was performed with the patient in the prone position. Betadine prep of proposed entry site performed.   NEEDLE PLACEMENT AT:  left L  3 lumbar facet (medial branch block). Under fluoroscopic guidance with oblique orientation of 15 degrees, a 22-gauge needle was inserted at the L  3 vertebral body level with needle placed at the targeted area of Burton's Eye or Eye of the Scotty Dog with documentation of needle placement in the superior and lateral border of targeted area of Burton's Eye or Eye of the Scotty Dog with oblique  orientation of 15 degrees. Following documentation of needle placement at the L  3 vertebral body level, needle placement was then accomplished at the L  4 vertebral body level.   NEEDLE PLACEMENT AT  L4 and L5 VERTEBRAL BODY LEVELS ON THE LEFT SIDE The procedure was performed at the  L4 and L5 vertebral body levels exactly as was performed at the L  3 vertebral body level utilizing the same technique and under fluoroscopic guidance.  NEEDLE PLACEMENT AT THE SACRAL ALA with AP view of the lumbosacral spine. With the patient in the prone position, Betadine prep of proposed entry site accomplished, a 22 gauge needle was inserted in the region of the sacral ala (groove formed by the superior articulating process of S1 and the sacral wing). Following documentation of needle placement at the sacral ala,  needle placement was then accomplished at the S1 foramen level.   NEEDLE PLACEMENT AT THE S1 FORAMEN LEVEL under fluoroscopic guidance with AP view of the lumbosacral spine and cephalad orientation of the fluoroscope, a 22-gauge needle was placed at the superior and lateral border of the S1 foramen under fluoroscopic guidance. Following documentation of needle placement at the S1 foramen.   Needle placement was then verified at all levels on lateral view. Following documentation of needle placement at all levels on lateral view and following negative aspiration for heme and CSF, each level was injected with 1 mL of 0.25% bupivacaine with Kenalog.     LUMBAR FACET, MEDIAL BRANCH NERVE, BLOCKS PERFORMED ON THE RIGHT SIDE   The procedure was performed on the right side exactly as was performed on the left side  at the same levels and utilizing the same technique under fluoroscopic guidance.     The patient tolerated the procedure well. A total of 40 mg of Kenalog was utilized for the procedure.   PLAN:  1. Medications: The patient will continue presently prescribed medication  oxycodone 2. May  consider modification of treatment regimen at time of return appointment pending response to treatment rendered on today's visit. 3. The patient is to follow-up with primary care physician  Dr. Posey Pronto and Elsworth Soho for further evaluation of blood pressure and general medical condition status post steroid injection performed on today's visit. 4. Surgical follow-up evaluation.  Patient will follow-up with Dr. Trenton Gammon in this regard and will consider further surgical evaluation as well 5. Neurological follow-up evaluation. Have addressed [facility] EMG studies and will consider additional studies 6. The patient may be candidate for radiofrequency procedures, implantation type procedures, and other treatment pending response to treatment and follow-up evaluation. 7. The patient has been advised to call the Pain Management Center prior to scheduled return appointment should there be significant change in condition or should patient have other concerns regarding condition prior to scheduled return appointment.  The patient is understanding and in agreement with suggested treatment plan.   Review of Systems     Objective:   Physical Exam        Assessment & Plan:

## 2015-03-17 ENCOUNTER — Telehealth: Payer: Self-pay | Admitting: *Deleted

## 2015-03-17 NOTE — Telephone Encounter (Signed)
Verbalizes leg is numb, however leg was numb prior to procedure. Patient to call if she has any problems.

## 2015-04-09 ENCOUNTER — Encounter: Payer: Self-pay | Admitting: Pain Medicine

## 2015-04-09 ENCOUNTER — Ambulatory Visit: Payer: Medicaid Other | Attending: Pain Medicine | Admitting: Pain Medicine

## 2015-04-09 VITALS — BP 117/59 | HR 85 | Temp 97.9°F | Resp 18 | Ht 65.0 in | Wt 180.0 lb

## 2015-04-09 DIAGNOSIS — M533 Sacrococcygeal disorders, not elsewhere classified: Secondary | ICD-10-CM | POA: Insufficient documentation

## 2015-04-09 DIAGNOSIS — M47816 Spondylosis without myelopathy or radiculopathy, lumbar region: Secondary | ICD-10-CM | POA: Insufficient documentation

## 2015-04-09 DIAGNOSIS — M5126 Other intervertebral disc displacement, lumbar region: Secondary | ICD-10-CM | POA: Diagnosis not present

## 2015-04-09 DIAGNOSIS — M48062 Spinal stenosis, lumbar region with neurogenic claudication: Secondary | ICD-10-CM

## 2015-04-09 DIAGNOSIS — M5416 Radiculopathy, lumbar region: Secondary | ICD-10-CM

## 2015-04-09 DIAGNOSIS — M5137 Other intervertebral disc degeneration, lumbosacral region: Secondary | ICD-10-CM

## 2015-04-09 DIAGNOSIS — M79605 Pain in left leg: Secondary | ICD-10-CM | POA: Diagnosis present

## 2015-04-09 DIAGNOSIS — M5136 Other intervertebral disc degeneration, lumbar region: Secondary | ICD-10-CM | POA: Diagnosis not present

## 2015-04-09 DIAGNOSIS — M4806 Spinal stenosis, lumbar region: Secondary | ICD-10-CM | POA: Diagnosis not present

## 2015-04-09 DIAGNOSIS — G588 Other specified mononeuropathies: Secondary | ICD-10-CM

## 2015-04-09 DIAGNOSIS — M545 Low back pain: Secondary | ICD-10-CM | POA: Diagnosis present

## 2015-04-09 DIAGNOSIS — M79604 Pain in right leg: Secondary | ICD-10-CM | POA: Diagnosis present

## 2015-04-09 MED ORDER — OXYCODONE HCL 5 MG PO TABS: ORAL_TABLET | ORAL | Status: DC

## 2015-04-09 NOTE — Patient Instructions (Addendum)
PLAN   Continue present medication oxycodone   F/U PCP Dr. Posey Pronto and Elsworth Soho for evaliation of  BP and general medical  condition  F/U Neurosurgical evaluation as discussed. Please see Dr. Melrose Nakayama for [facility] EMG studies as discussed. Ask staff date of your appointment for nerve studies. Discuss Neurontin and other anticonvulsive medication with Dr. Melrose Nakayama as we discussed Patient prefers Neurontin  F/U neurological evaluation. Follow-up with Dr. Melrose Nakayama as discussed  May consider radiofrequency rhizolysis or intraspinal procedures pending response to present treatment and F/U evaluation   Patient to call Pain Management Center should patient have concerns prior to scheduled return appointment

## 2015-04-09 NOTE — Progress Notes (Signed)
Safety precautions to be maintained throughout the outpatient stay will include: orient to surroundings, keep bed in low position, maintain call bell within reach at all times, provide assistance with transfer out of bed and ambulation.  

## 2015-04-09 NOTE — Progress Notes (Signed)
   Subjective:    Patient ID: Gina Costa, female    DOB: May 26, 1960, 54 y.o.   MRN: [id number]  HPI  The patient is a 54 year old female who returns to pain management for further evaluation and treatment of pain involving the lower back and lower extremity region. Patient is with known degenerative changes of the lumbar spine. Patient has had improvement with interventional treatment performed in pain management Center and is with return of pain of significant degree and lower extremity weakness at this time. We will proceed with further studies including PN[facility]/EMG studies and have repeatedly requested patient to be about to undergo neurosurgical reevaluation of lumbar lower extremity pain and paresthesias. We will consider patient for additional modifications of treatment pending results of PN[facility]/EMG studies and follow-up evaluation and pain management Center. Patient will also address her medications with Dr. Melrose Nakayama. Patient states that she prefers Neurontin instead of other medication prescribed by Dr. Melrose Nakayama. We will prescribe oxycodone for patient and patient will follow-up with Dr. Melrose Nakayama regarding Neurontin and other medication prescribed by Dr. Jerline Pain. The patient was understanding and agreed to suggested treatment plan.   Review of Systems     Objective:   Physical Exam There was tenderness to palpation of the splenius capitis and occipitalis musculature regions of mild degree. There appeared to be unremarkable Spurling's maneuver and patient appeared to be with bilaterally equal grip strength. Tinel and Phalen's maneuver were without increase of pain of significant degree and patient appeared to be with bilaterally equal grip strength. Palpation over the thoracic facet thoracic paraspinal musculature region was attends to palpation of mild to moderate degree with no crepitus of the thoracic region noted. Palpation over the lumbar paraspinal muscles and lumbar facet region was attends to  palpation of moderate degree. Lateral bending and rotation extension and palpation of the lumbar facets reproduced moderate discomfort. His was tolerates possibly 20 without increased pain with dorsiflexion noted. DTRs difficult to elicit. Patient difficulty relaxing. No definite sensory deficit or dermatomal distribution was detected. There was negative clonus negative Homans. There was tenderness to palpation of the PSIS and P IIS region a moderate degree there was moderate tenderness to palpation over the greater trochanteric region and iliotibial band region. Patrick's maneuver was with increased pain of the left as well as on the right. Abdomen was nontender with no costovertebral tenderness noted.       Assessment & Plan:  Degenerative disc disease lumbar spine Multilevel degenerative changes lumbar spine asymmetric edema of the right facet joint, L5-S1 level most involved, some progression since prior exam, multilevel facet arthropathy L2-3, L3-4, L4-5, and L5-S1. Disc bulging and stenosis noted on lumbar MRI  Lumbar facet syndrome  Lumbar stenosis with neurogenic claudication  Sacroiliac joint dysfunction    PLAN   Continue present medication oxycodone   F/U PCP Gina Costa and Gina Costa for evaliation of  BP and general medical  condition  F/U Neurosurgical evaluation as discussed. Please see Dr. Melrose Nakayama for [facility] EMG studies as discussed. Ask staff date of your appointment for nerve studies. Discuss Neurontin and other anticonvulsive medication with Dr. Melrose Nakayama as we discussed Patient prefers Neurontin  F/U neurological evaluation. Follow-up with Dr. Melrose Nakayama as discussed  May consider radiofrequency rhizolysis or intraspinal procedures pending response to present treatment and F/U evaluation   Patient to call Pain Management Center should patient have concerns prior to scheduled return appointment

## 2015-05-12 ENCOUNTER — Encounter: Payer: Self-pay | Admitting: Pain Medicine

## 2015-05-12 ENCOUNTER — Ambulatory Visit: Payer: Medicaid Other | Attending: Pain Medicine | Admitting: Pain Medicine

## 2015-05-12 VITALS — BP 124/67 | HR 86 | Temp 97.7°F | Resp 16 | Ht 66.0 in | Wt 187.0 lb

## 2015-05-12 DIAGNOSIS — M5126 Other intervertebral disc displacement, lumbar region: Secondary | ICD-10-CM | POA: Insufficient documentation

## 2015-05-12 DIAGNOSIS — M5416 Radiculopathy, lumbar region: Secondary | ICD-10-CM

## 2015-05-12 DIAGNOSIS — G588 Other specified mononeuropathies: Secondary | ICD-10-CM

## 2015-05-12 DIAGNOSIS — M5136 Other intervertebral disc degeneration, lumbar region: Secondary | ICD-10-CM | POA: Insufficient documentation

## 2015-05-12 DIAGNOSIS — M1288 Other specific arthropathies, not elsewhere classified, other specified site: Secondary | ICD-10-CM | POA: Diagnosis not present

## 2015-05-12 DIAGNOSIS — M545 Low back pain: Secondary | ICD-10-CM | POA: Diagnosis present

## 2015-05-12 DIAGNOSIS — M533 Sacrococcygeal disorders, not elsewhere classified: Secondary | ICD-10-CM | POA: Insufficient documentation

## 2015-05-12 DIAGNOSIS — M79605 Pain in left leg: Secondary | ICD-10-CM | POA: Diagnosis present

## 2015-05-12 DIAGNOSIS — M5137 Other intervertebral disc degeneration, lumbosacral region: Secondary | ICD-10-CM

## 2015-05-12 DIAGNOSIS — M47816 Spondylosis without myelopathy or radiculopathy, lumbar region: Secondary | ICD-10-CM | POA: Diagnosis not present

## 2015-05-12 DIAGNOSIS — M48062 Spinal stenosis, lumbar region with neurogenic claudication: Secondary | ICD-10-CM

## 2015-05-12 DIAGNOSIS — M4806 Spinal stenosis, lumbar region: Secondary | ICD-10-CM | POA: Diagnosis not present

## 2015-05-12 DIAGNOSIS — M79604 Pain in right leg: Secondary | ICD-10-CM | POA: Diagnosis present

## 2015-05-12 MED ORDER — GABAPENTIN 300 MG PO CAPS: ORAL_CAPSULE | ORAL | Status: DC

## 2015-05-12 MED ORDER — OXYCODONE HCL 5 MG PO TABS: ORAL_TABLET | ORAL | Status: DC

## 2015-05-12 NOTE — Progress Notes (Signed)
Subjective:    Patient ID: Gina Costa, female    DOB: [birth year], 54 y.o.   MRN: [id number]  HPI  Patient is a 54 year old female who returns to [facility] management Center for further evaluation and treatment of pain involving the lower back and lower extremity region predominantly. Patient states that her pain is aggravated by standing and walking and that the pain becomes more intense as patient spends more time on the feet. The patient states the pain eventually radiating to the lower extremities and also is associated with weakness of the lower extremities. We discussed patient undergoing surgical reevaluation and patient will proceed with surgical reevaluation as discussed. Patient will also continue Neurontin as well as oxycodone and states that the Neurontin has been helpful in terms of reducing the burning stinging sensation of the lower extremities. Patient denies drowsiness confusion or other undesirable side effects due to her medications Neurontin and oxycodone. We discussed patient's condition and will proceed with lumbosacral selective nerve root block at time return appointment and will await results of surgical evaluation to consider additional modifications of treatment regimen as discussed and as explained to patient on today's visit. The patient stated that her pain radiating toward the lower extremity on one side predominantly and appeared to be without significant pain of the of the lower extremity this time. We will remain available to consider modifications of treatment pending follow-up evaluation. The patient agreed to suggested treatment plan. We will proceed with lumbosacral selective nerve root block at time return appointment in attempt to decrease severity of symptoms, minimize progression of patient's symptoms, and avoid the need for more involved treatment. The patient agreed to suggested treatment plan to proceed with lumbosacral selective nerve root block to consult to be  medically necessary procedure          Review of Systems     Objective:   Physical Exam  There was tends to palpation of paraspinal muscular treat conservatively cervical facet region a mild degree with mild tenderness over the splenius capitis and occipitalis musculature regions. Patient appeared to be with bilaterally equal grip strength. Tinel and Phalen's maneuver were without increased pain of significant degree. Palpation of the acromial clavicular and glenohumeral joint regions reproduces minimal discomfort. There appeared to be unremarkable Spurling's maneuver. Palpation of the acromioclavicular and glenohumeral joint regions reproduces minimal discomfort. Palpation over the thoracic facet thoracic paraspinal musculature region was associated with tends to palpation with moderate muscle spasms of the lower thoracic paraspinal musculature region with no crepitus of the thoracic region noted. Palpation over the region of the lumbar paraspinal musculature region lumbar facet region was attends to palpation of moderate degree with lateral bending rotation extension and palpation of the lumbar facets reproduced moderately severe discomfort. There was tenderness along the PSIS and PII S region a mild to moderate degree with tenderness of the greater trochanteric region iliotibial band region a mild to moderate degree. EHL strength appeared to be decreased. No definite sensory deficit or dermatomal distribution detected. There was negative clonus negative Homans. DTRs were difficult to elicit appeared to be trace at the knees. Abdomen nontender with no costovertebral tenderness noted.          Assessment & Plan:      Degenerative disc disease lumbar spine Multilevel degenerative changes lumbar spine asymmetric edema of the right facet joint, L5-S1 level most involved, some progression since prior exam, multilevel facet arthropathy L2-3, L3-4, L4-5, and L5-S1. Disc bulging and stenosis  noted on  lumbar MRI  Lumbar facet syndrome  Lumbar stenosis with neurogenic claudication  Sacroiliac joint dysfunction    PLAN   Continue present medication oxycodone and Neurontin  Since Dr. Melrose Nakayama agrees wih resuming Neurontin  Lumbosacral selective nerve root block to be performed at time return appointment  F/U PCP Dr. Posey Pronto and Elsworth Soho for evaliation of  BP and general medical  condition  F/U Neurosurgical evaluation as discussed.  Please have Dr. Posey Pronto and Elsworth Soho refer you to neurosurgeon as discused  F/U neurological evaluation. Follow-up with Dr. Melrose Nakayama as discussed  May consider radiofrequency rhizolysis or intraspinal procedures pending response to present treatment and F/U evaluation   Patient to call Pain Management Center should patient have concerns prior to scheduled return appointment

## 2015-05-12 NOTE — Patient Instructions (Addendum)
PLAN   Continue present medication oxycodone and Neurontin  Since Dr. Melrose Nakayama agrees wih resuming Neurontin  lumbosacrl selective nrv rooblock  F/U PCP Dr. Posey Pronto and Elsworth Soho for evaliation of  BP and general medical  condition  F/U Neurosurgical evaluation as discussed.  Please have Dr. Posey Pronto and Elsworth Soho refer you to neurosurgeon as discused  F/U neurological evaluation. Follow-up with Dr. Melrose Nakayama as discussed  May consider radiofrequency rhizolysis or intraspinal procedures pending response to present treatment and F/U evaluation   Patient to call Pain Management Center should patient have concerns prior to scheduled return appointmentGENERAL RISKS AND COMPLICATIONS  What are the risk, side effects and possible complications? Generally speaking, most procedures are safe.  However, with any procedure there are risks, side effects, and the possibility of complications.  The risks and complications are dependent upon the sites that are lesioned, or the type of nerve block to be performed.  The closer the procedure is to the spine, the more serious the risks are.  Great care is taken when placing the radio frequency needles, block needles or lesioning probes, but sometimes complications can occur. 1. Infection: Any time there is an injection through the skin, there is a risk of infection.  This is why sterile conditions are used for these blocks.  There are four possible types of infection. 1. Localized skin infection. 2. Central Nervous System Infection-This can be in the form of Meningitis, which can be deadly. 3. Epidural Infections-This can be in the form of an epidural abscess, which can cause pressure inside of the spine, causing compression of the spinal cord with subsequent paralysis. This would require an emergency surgery to decompress, and there are no guarantees that the patient would recover from the paralysis. 4. Discitis-This is an infection of the intervertebral discs.  It occurs in  about 1% of discography procedures.  It is difficult to treat and it may lead to surgery.        2. Pain: the needles have to go through skin and soft tissues, will cause soreness.       3. Damage to internal structures:  The nerves to be lesioned may be near blood vessels or    other nerves which can be potentially damaged.       4. Bleeding: Bleeding is more common if the patient is taking blood thinners such as  aspirin, Coumadin, Ticiid, Plavix, etc., or if he/she have some genetic predisposition  such as hemophilia. Bleeding into the spinal canal can cause compression of the spinal  cord with subsequent paralysis.  This would require an emergency surgery to  decompress and there are no guarantees that the patient would recover from the  paralysis.       5. Pneumothorax:  Puncturing of a lung is a possibility, every time a needle is introduced in  the area of the chest or upper back.  Pneumothorax refers to free air around the  collapsed lung(s), inside of the thoracic cavity (chest cavity).  Another two possible  complications related to a similar event would include: Hemothorax and Chylothorax.   These are variations of the Pneumothorax, where instead of air around the collapsed  lung(s), you may have blood or chyle, respectively.       6. Spinal headaches: They may occur with any procedures in the area of the spine.       7. Persistent CSF (Cerebro-Spinal Fluid) leakage: This is a rare problem, but may occur  with prolonged intrathecal or epidural  catheters either due to the formation of a fistulous  track or a dural tear.       8. Nerve damage: By working so close to the spinal cord, there is always a possibility of  nerve damage, which could be as serious as a permanent spinal cord injury with  paralysis.       9. Death:  Although rare, severe deadly allergic reactions known as "Anaphylactic  reaction" can occur to any of the medications used.      10. Worsening of the symptoms:  We can always  make thing worse.  What are the chances of something like this happening? Chances of any of this occuring are extremely low.  By statistics, you have more of a chance of getting killed in a motor vehicle accident: while driving to the hospital than any of the above occurring .  Nevertheless, you should be aware that they are possibilities.  In general, it is similar to taking a shower.  Everybody knows that you can slip, hit your head and get killed.  Does that mean that you should not shower again?  Nevertheless always keep in mind that statistics do not mean anything if you happen to be on the wrong side of them.  Even if a procedure has a 1 (one) in a 1,000,000 (million) chance of going wrong, it you happen to be that one..Also, keep in mind that by statistics, you have more of a chance of having something go wrong when taking medications.  Who should not have this procedure? If you are on a blood thinning medication (e.g. Coumadin, Plavix, see list of "Blood Thinners"), or if you have an active infection going on, you should not have the procedure.  If you are taking any blood thinners, please inform your physician.  How should I prepare for this procedure?  Do not eat or drink anything at least six hours prior to the procedure.  Bring a driver with you .  It cannot be a taxi.  Come accompanied by an adult that can drive you back, and that is strong enough to help you if your legs get weak or numb from the local anesthetic.  Take all of your medicines the morning of the procedure with just enough water to swallow them.  If you have diabetes, make sure that you are scheduled to have your procedure done first thing in the morning, whenever possible.  If you have diabetes, take only half of your insulin dose and notify our nurse that you have done so as soon as you arrive at the clinic.  If you are diabetic, but only take blood sugar pills (oral hypoglycemic), then do not take them on the  morning of your procedure.  You may take them after you have had the procedure.  Do not take aspirin or any aspirin-containing medications, at least eleven (11) days prior to the procedure.  They may prolong bleeding.  Wear loose fitting clothing that may be easy to take off and that you would not mind if it got stained with Betadine or blood.  Do not wear any jewelry or perfume  Remove any nail coloring.  It will interfere with some of our monitoring equipment.  NOTE: Remember that this is not meant to be interpreted as a complete list of all possible complications.  Unforeseen problems may occur.  BLOOD THINNERS The following drugs contain aspirin or other products, which can cause increased bleeding during surgery and should not be taken  for 2 weeks prior to and 1 week after surgery.  If you should need take something for relief of minor pain, you may take acetaminophen which is found in Tylenol,m Datril, Anacin-3 and Panadol. It is not blood thinner. The products listed below are.  Do not take any of the products listed below in addition to any listed on your instruction sheet.  A.P.C or A.P.C with Codeine Codeine Phosphate Capsules #3 Ibuprofen Ridaura  ABC compound Congesprin Imuran rimadil  Advil Cope Indocin Robaxisal  Alka-Seltzer Effervescent Pain Reliever and Antacid Coricidin or Coricidin-D  Indomethacin Rufen  Alka-Seltzer plus Cold Medicine Cosprin Ketoprofen S-A-C Tablets  Anacin Analgesic Tablets or Capsules Coumadin Korlgesic Salflex  Anacin Extra Strength Analgesic tablets or capsules CP-2 Tablets Lanoril Salicylate  Anaprox Cuprimine Capsules Levenox Salocol  Anexsia-D Dalteparin Magan Salsalate  Anodynos Darvon compound Magnesium Salicylate Sine-off  Ansaid Dasin Capsules Magsal Sodium Salicylate  Anturane Depen Capsules Marnal Soma  APF Arthritis pain formula Dewitt's Pills Measurin Stanback  Argesic Dia-Gesic Meclofenamic Sulfinpyrazone  Arthritis Bayer Timed  Release Aspirin Diclofenac Meclomen Sulindac  Arthritis pain formula Anacin Dicumarol Medipren Supac  Analgesic (Safety coated) Arthralgen Diffunasal Mefanamic Suprofen  Arthritis Strength Bufferin Dihydrocodeine Mepro Compound Suprol  Arthropan liquid Dopirydamole Methcarbomol with Aspirin Synalgos  ASA tablets/Enseals Disalcid Micrainin Tagament  Ascriptin Doan's Midol Talwin  Ascriptin A/D Dolene Mobidin Tanderil  Ascriptin Extra Strength Dolobid Moblgesic Ticlid  Ascriptin with Codeine Doloprin or Doloprin with Codeine Momentum Tolectin  Asperbuf Duoprin Mono-gesic Trendar  Aspergum Duradyne Motrin or Motrin IB Triminicin  Aspirin plain, buffered or enteric coated Durasal Myochrisine Trigesic  Aspirin Suppositories Easprin Nalfon Trillsate  Aspirin with Codeine Ecotrin Regular or Extra Strength Naprosyn Uracel  Atromid-S Efficin Naproxen Ursinus  Auranofin Capsules Elmiron Neocylate Vanquish  Axotal Emagrin Norgesic Verin  Azathioprine Empirin or Empirin with Codeine Normiflo Vitamin E  Azolid Emprazil Nuprin Voltaren  Bayer Aspirin plain, buffered or children's or timed BC Tablets or powders Encaprin Orgaran Warfarin Sodium  Buff-a-Comp Enoxaparin Orudis Zorpin  Buff-a-Comp with Codeine Equegesic Os-Cal-Gesic   Buffaprin Excedrin plain, buffered or Extra Strength Oxalid   Bufferin Arthritis Strength Feldene Oxphenbutazone   Bufferin plain or Extra Strength Feldene Capsules Oxycodone with Aspirin   Bufferin with Codeine Fenoprofen Fenoprofen Pabalate or Pabalate-SF   Buffets II Flogesic Panagesic   Buffinol plain or Extra Strength Florinal or Florinal with Codeine Panwarfarin   Buf-Tabs Flurbiprofen Penicillamine   Butalbital Compound Four-way cold tablets Penicillin   Butazolidin Fragmin Pepto-Bismol   Carbenicillin Geminisyn Percodan   Carna Arthritis Reliever Geopen Persantine   Carprofen Gold's salt Persistin   Chloramphenicol Goody's Phenylbutazone   Chloromycetin  Haltrain Piroxlcam   Clmetidine heparin Plaquenil   Cllnoril Hyco-pap Ponstel   Clofibrate Hydroxy chloroquine Propoxyphen         Before stopping any of these medications, be sure to consult the physician who ordered them.  Some, such as Coumadin (Warfarin) are ordered to prevent or treat serious conditions such as "deep thrombosis", "pumonary embolisms", and other heart problems.  The amount of time that you may need off of the medication may also vary with the medication and the reason for which you were taking it.  If you are taking any of these medications, please make sure you notify your pain physician before you undergo any procedures.         Selective Nerve Root Block Patient Information  Description: Specific nerve roots exit the spinal canal and these nerves can be compressed and inflamed  by a bulging disc and bone spurs.  By injecting steroids on the nerve root, we can potentially decrease the inflammation surrounding these nerves, which often leads to decreased pain.  Also, by injecting local anesthesia on the nerve root, this can provide [place] helpful information to give to your referring doctor if it decreases your pain.  Selective nerve root blocks can be done along the spine from the neck to the low back depending on the location of your pain.   After numbing the skin with local anesthesia, a small needle is passed to the nerve root and the position of the needle is verified using x-ray pictures.  After the needle is in correct position, we then deposit the medication.  You may experience a pressure sensation while this is being done.  The entire block usually lasts less than 15 minutes.  Conditions that may be treated with selective nerve root blocks:  Low back and leg pain  Spinal stenosis  Diagnostic block prior to potential surgery  Neck and arm pain  Post laminectomy syndrome  Preparation for the injection:  1. Do not eat any solid food or dairy products within  6 hours of your appointment. 2. You may drink clear liquids up to 2 hours before an appointment.  Clear liquids include water, black coffee, juice or soda.  No milk or cream please. 3. You may take your regular medications, including pain medications, with a sip of water before your appointment.  Diabetics should hold regular insulin (if taken separately) and take 1/2 normal NPH dose the morning of the procedure.  Carry some sugar containing items with you to your appointment. 4. A driver must accompany you and be prepared to drive you home after your procedure. 5. Bring all your current medications with you. 6. An IV may be inserted and sedation may be given at the discretion of the physician. 7. A blood pressure cuff, EKG, and other monitors will often be applied during the procedure.  Some patients may need to have extra oxygen administered for a short period. 8. You will be asked to provide medical information, including allergies, prior to the procedure.  We must know immediately if you are taking blood  Thinners (like Coumadin) or if you are allergic to IV iodine contrast (dye).  Possible side-effects: All are usually temporary  Bleeding from needle site  Light headedness  Numbness and tingling  Decreased blood pressure  Weakness in arms/legs  Pressure sensation in back/neck  Pain at injection site (several days)  Possible complications: All are extremely rare  Infection  Nerve injury  Spinal headache (a headache wore with upright position)  Call if you experience:  Fever/chills associated with headache or increased back/neck pain  Headache worsened by an upright position  New onset weakness or numbness of an extremity below the injection site  Hives or difficulty breathing (go to the emergency room)  Inflammation or drainage at the injection site(s)  Severe back/neck pain greater than usual  New symptoms which are concerning to you  Please  note:  Although the local anesthetic injected can often make your back or neck feel good for several hours after the injection the pain will likely return.  It takes 3-5 days for steroids to work on the nerve root. You may not notice any pain relief for at least one week.  If effective, we will often do a series of 3 injections spaced 3-6 weeks apart to maximally decrease your pain.    If  you have any questions, please call [phone] [facility]

## 2015-05-12 NOTE — Progress Notes (Signed)
Patient is here today for medication refill.  States that pain medication is no longer effective d/t the length of time she has been taking.  Would like to discuss options.  Safety precautions to be maintained throughout the outpatient stay will include: orient to surroundings, keep bed in low position, maintain call bell within reach at all times, provide assistance with transfer out of bed and ambulation.

## 2015-05-29 ENCOUNTER — Other Ambulatory Visit: Payer: Self-pay | Admitting: Pain Medicine

## 2015-06-01 ENCOUNTER — Ambulatory Visit: Payer: Medicaid Other | Admitting: Pain Medicine

## 2015-06-03 ENCOUNTER — Ambulatory Visit: Payer: Medicaid Other | Attending: Pain Medicine | Admitting: Pain Medicine

## 2015-06-03 ENCOUNTER — Encounter: Payer: Self-pay | Admitting: Pain Medicine

## 2015-06-03 VITALS — BP 115/55 | HR 96 | Temp 98.1°F | Resp 18 | Wt 188.0 lb

## 2015-06-03 DIAGNOSIS — M545 Low back pain: Secondary | ICD-10-CM | POA: Insufficient documentation

## 2015-06-03 DIAGNOSIS — M5416 Radiculopathy, lumbar region: Secondary | ICD-10-CM

## 2015-06-03 DIAGNOSIS — M5136 Other intervertebral disc degeneration, lumbar region: Secondary | ICD-10-CM | POA: Diagnosis not present

## 2015-06-03 DIAGNOSIS — M48062 Spinal stenosis, lumbar region with neurogenic claudication: Secondary | ICD-10-CM

## 2015-06-03 DIAGNOSIS — M79606 Pain in leg, unspecified: Secondary | ICD-10-CM | POA: Diagnosis present

## 2015-06-03 DIAGNOSIS — G588 Other specified mononeuropathies: Secondary | ICD-10-CM

## 2015-06-03 DIAGNOSIS — M51379 Other intervertebral disc degeneration, lumbosacral region without mention of lumbar back pain or lower extremity pain: Secondary | ICD-10-CM

## 2015-06-03 DIAGNOSIS — M533 Sacrococcygeal disorders, not elsewhere classified: Secondary | ICD-10-CM

## 2015-06-03 DIAGNOSIS — M47816 Spondylosis without myelopathy or radiculopathy, lumbar region: Secondary | ICD-10-CM

## 2015-06-03 DIAGNOSIS — M5137 Other intervertebral disc degeneration, lumbosacral region: Secondary | ICD-10-CM

## 2015-06-03 DIAGNOSIS — M5126 Other intervertebral disc displacement, lumbar region: Secondary | ICD-10-CM | POA: Insufficient documentation

## 2015-06-03 MED ORDER — OXYCODONE HCL 5 MG PO TABS: ORAL_TABLET | ORAL | Status: DC

## 2015-06-03 MED ORDER — GABAPENTIN 300 MG PO CAPS: ORAL_CAPSULE | ORAL | Status: DC

## 2015-06-03 MED ORDER — SODIUM CHLORIDE 0.9 % IJ SOLN
INTRAMUSCULAR | Status: AC
  Filled 2015-06-03: qty 10

## 2015-06-03 MED ORDER — SODIUM CHLORIDE 0.9 % IJ SOLN
20.0000 mL | Freq: Once | INTRAMUSCULAR | Status: AC
  Administered 2015-06-03: 11:00:00

## 2015-06-03 MED ORDER — TRIAMCINOLONE ACETONIDE 40 MG/ML IJ SUSP
INTRAMUSCULAR | Status: AC
  Filled 2015-06-03: qty 1

## 2015-06-03 MED ORDER — LIDOCAINE HCL (PF) 1 % IJ SOLN
INTRAMUSCULAR | Status: AC
  Filled 2015-06-03: qty 5

## 2015-06-03 MED ORDER — TRIAMCINOLONE ACETONIDE 40 MG/ML IJ SUSP
40.0000 mg | Freq: Once | INTRAMUSCULAR | Status: AC
  Administered 2015-06-03: 11:00:00

## 2015-06-03 MED ORDER — LIDOCAINE HCL (PF) 1 % IJ SOLN
10.0000 mL | Freq: Once | INTRAMUSCULAR | Status: AC
  Administered 2015-06-03: 11:00:00 via SUBCUTANEOUS

## 2015-06-03 MED ORDER — LACTATED RINGERS IV SOLN: 1000.0000 mL | INTRAVENOUS | Status: DC

## 2015-06-03 NOTE — Progress Notes (Signed)
Subjective:    Patient ID: Gina Costa, female    DOB: Mar 06, 1961, 55 y.o.   MRN: [id number]  HPI  PROCEDURE PERFORMED: Lumbosacral selective nerve root block   NOTE: The patient is a 55 y.o. female who returns to [facility] for further evaluation and treatment of pain involving the lumbar and lower extremity region. Studies consisting of MRI has revealed the patient to be with evidence of Degenerative disc disease lumbar spine Multilevel degenerative changes lumbar spine asymmetric edema of the right facet joint, L5-S1 level most involved, some progression since prior exam, multilevel facet arthropathy L2-3, L3-4, L4-5, and L5-S1. Disc bulging and stenosis noted on lumbar MRI. There is concern regarding patient being with component of lumbar stenosis with neurogenic claudication and lumbar radiculopathy in addition to facet arthropathy contributing to facet syndrome. The risks, benefits, and expectations of the procedure have been explained to the patient who was understanding and in agreement with suggested treatment plan. We will proceed with interventional treatment as discussed and as explained to the patient. The patient is understanding and in agreement with suggested treatment plan.   DESCRIPTION OF PROCEDURE: Lumbosacral selective nerve root block with IV Versed, IV fentanyl conscious sedation, EKG, blood pressure, pulse, and pulse oximetry monitoring. The procedure was performed with the patient in the prone position under fluoroscopic guidance. With the patient in the prone position, Betadine prep of proposed entry site was performed. Local anesthetic skin wheal of proposed needle entry site was prepared with 1.5% plain lidocaine with AP view of the lumbosacral spine.   PROCEDURE #1: Needle placement at the right L 2 vertebral body: A 22 -gauge needle was inserted at the inferior border of the transverse process of the vertebral body with needle placed medial to the midline  of the transverse process on AP view of the lumbosacral spine.   NEEDLE PLACEMENT AT  L3, L4, and L5  VERTEBRAL BODY LEVELS  Needle  placement was accomplished at L3, L4, and L5  vertebral body levels on the right side exactly as was accomplished at the L2  vertebral body level  and utilizing the same technique and under fluoroscopic guidance.  PROCEDURE #4: Needle placement at the S1 foramen. With the patient in the prone position with Betadine prep of proposed entry site accomplished, the S1 foramen was visualized under fluoroscopic guidance with AP view of the lumbosacral spine with cephalad orientation of the fluoroscope with local anesthetic skin wheal of 1.5% lidocaine of proposed needle entry site prepared. A 22-gauge needle was inserted S1 foramen under fluoroscopic guidance eliciting paresthesias radiating from the buttocks to the lower extremity after which needle was slightly withdrawn.   Needle placement was then verified on lateral view at all levels with needle tip documented to be in the posterior superior quadrant of the intervertebral foramen of  L 2, L3, L4, and L5. Following negative aspiration for heme and CSF at each level, each level was injected with 3 mL of preservative-free normal saline with Kenalog.   The patient tolerated the procedure well. A total of 10 mg of Kenalog was utilized for the procedure.   PLAN:  1. Medications: Will continue presently prescribed medications. Neurontin and oxycodone 2. The patient is to undergo follow-up evaluation with PCP Dr. Posey Pronto and Elsworth Soho for evaluation of blood pressure and general medical condition status post procedure performed on today's visit. 3. Surgical follow-up evaluation. Patient will follow-up with Dr. Trenton Gammon as well as other neurosurgeon 4. Neurological evaluation. May consider  PNCV EMG studies 5. May consider radiofrequency procedures, implantation type procedures and other treatment pending response to treatment and  follow-up evaluation. 6. The patient has been advise do adhere to proper body mechanics and avoid activities which may aggravate condition. The patient has been advised to call the Pain Management Center prior to scheduled return appointment should there be significant change in the patient's condition or should the patient have other concerns regarding condition prior to scheduled return appointment.   Review of Systems     Objective:   Physical Exam        Assessment & Plan:

## 2015-06-03 NOTE — Patient Instructions (Addendum)
Continue present medication oxycodone  F/U PCP Dr. Posey Pronto and Elsworth Soho for evaliation of  BP and general medical  condition  F/U surgical evaluation. Neurosurgical reevaluation as discussed. Please discuss neurosurgical evaluation with Dr. Posey Pronto and Elsworth Soho as we have discussed. Please have them schedule neurosurgical evaluation for you  F/U neurological evaluation. Follow-up as discussed with Dr. Melrose Nakayama  May consider radiofrequency rhizolysis or intraspinal procedures pending response to present treatment and F/U evaluation Pain Management  Call pain management prior to scheduled return appointment for any concerns regarding condition prior to scheduled return appointmentSelective Nerve Root Block Patient Information  Description: Specific nerve roots exit the spinal canal and these nerves can be compressed and inflamed by a bulging disc and bone spurs.  By injecting steroids on the nerve root, we can potentially decrease the inflammation surrounding these nerves, which often leads to decreased pain.  Also, by injecting local anesthesia on the nerve root, this can provide [place] helpful information to give to your referring doctor if it decreases your pain.  Selective nerve root blocks can be done along the spine from the neck to the low back depending on the location of your pain.   After numbing the skin with local anesthesia, a small needle is passed to the nerve root and the position of the needle is verified using x-ray pictures.  After the needle is in correct position, we then deposit the medication.  You may experience a pressure sensation while this is being done.  The entire block usually lasts less than 15 minutes.  Conditions that may be treated with selective nerve root blocks:  Low back and leg pain  Spinal stenosis  Diagnostic block prior to potential surgery  Neck and arm pain  Post laminectomy syndrome  Preparation for the injection:  1. Do not eat any solid food or dairy  products within 6 hours of your appointment. 2. You may drink clear liquids up to 2 hours before an appointment.  Clear liquids include water, black coffee, juice or soda.  No milk or cream please. 3. You may take your regular medications, including pain medications, with a sip of water before your appointment.  Diabetics should hold regular insulin (if taken separately) and take 1/2 normal NPH dose the morning of the procedure.  Carry some sugar containing items with you to your appointment. 4. A driver must accompany you and be prepared to drive you home after your procedure. 5. Bring all your current medications with you. 6. An IV may be inserted and sedation may be given at the discretion of the physician. 7. A blood pressure cuff, EKG, and other monitors will often be applied during the procedure.  Some patients may need to have extra oxygen administered for a short period. 8. You will be asked to provide medical information, including allergies, prior to the procedure.  We must know immediately if you are taking blood  Thinners (like Coumadin) or if you are allergic to IV iodine contrast (dye).  Possible side-effects: All are usually temporary  Bleeding from needle site  Light headedness  Numbness and tingling  Decreased blood pressure  Weakness in arms/legs  Pressure sensation in back/neck  Pain at injection site (several days)  Possible complications: All are extremely rare  Infection  Nerve injury  Spinal headache (a headache wore with upright position)  Call if you experience:  Fever/chills associated with headache or increased back/neck pain  Headache worsened by an upright position  New onset weakness or numbness of  an extremity below the injection site  Hives or difficulty breathing (go to the emergency room)  Inflammation or drainage at the injection site(s)  Severe back/neck pain greater than usual  New symptoms which are concerning to you  Please  note:  Although the local anesthetic injected can often make your back or neck feel good for several hours after the injection the pain will likely return.  It takes 3-5 days for steroids to work on the nerve root. You may not notice any pain relief for at least one week.  If effective, we will often do a series of 3 injections spaced 3-6 weeks apart to maximally decrease your pain.    If you have any questions, please call [phone] [facility] Discharge Instructions  General Discharge Instructions :  If you need to reach your doctor call: Monday-Friday 8:00 am - 4:00 pm at [phone] or toll free [phone].  After clinic hours [phone] to have operator reach doctor.  Bring all of your medication bottles to all your appointments in the pain clinic.  To cancel or reschedule your appointment with Pain Management please remember to call 24 hours in advance to avoid a fee.  Refer to the educational materials which you have been given on: General Risks, I had my Procedure. Discharge Instructions, Post Sedation.  Post Procedure Instructions:  The drugs you were given will stay in your system until tomorrow, so for the next 24 hours you should not drive, make any legal decisions or drink any alcoholic beverages.  You may eat anything you prefer, but it is better to start with liquids then soups and crackers, and gradually work up to solid foods.  Please notify your doctor immediately if you have any unusual bleeding, trouble breathing or pain that is not related to your normal pain.  Depending on the type of procedure that was done, some parts of your body may feel week and/or numb.  This usually clears up by tonight or the next day.  Walk with the use of an assistive device or accompanied by an adult for the 24 hours.  You may use ice on the affected area for the first 24 hours.  Put ice in a Ziploc bag and cover with a towel  and place against area 15 minutes on 15 minutes off.  You may switch to heat after 24 hours.Lateral Femoral Cutaneous Nerve Block Patient Information  Description: The lateral femoral cutaneous nerve of the thigh is a purely sensory nerve that can become entrapped or irritated for a number of reasons.  The pain associated with this condition is called meralgia paraesthetica.  Patients affected with this syndrome have burning pain or abnormal sensation along the lateral aspect of the thigh.  The pain can be worsened by prolonged walking, standing, or constrictive garments around the house.   The diagnosis can be confirmed and treatment initiated by blocking the nerve with local anesthetic (like Novocaine).  At times, a steroid solution may be injected at the same time.  The site of injection is through a tiny needle in the left, lower quadrant of the abdomen.   The entire block usually lasts less than 5 minutes.  Conditions which may be treated by lateral femoral cutaneous nerve block:   Meralagia paraesthetica  Preparation for the injection:  8. Do not eat any solid food or dairy products within 6 hours of your appointment.  9. You may drink clear liquids up to 2 hours before  appointment.  Clear liquids include water, black coffee, juice or soda. No milk or cream please. 10. You may take your regular medication, including pain medications, with a sip of water before your appointment.  Diabetics should hold regular insulin (if taken separately) and take 1/2 normal NPH dose the morning of the procedure.  Carry some sugar containing items with you to your appointment. 11. A driver must accompany you and be prepared to drive you home after your procedure. 12. Bring all you current medications with you 13. An IV may be inserted and sedation may be given at the discretion of the physician. 14. A blood pressure cuff, EKG and other monitors will often be applied during the procedure.  Some patients may  need to have extra oxygen administered for a short period. 55. You will be asked to provide medical information, including your allergies and medications, prior to the procedure.  We must know immediately if you are taking blood thinners (like Coumadin/Warfarin) or if you allergic to IV iodine contrast (dye)  We must know if you could possible be pregnant.   Possible side-effects:   Bleeding from needle site  Infection (rate, may require surgery)  Nerve injury (rare)  Numbness and Tingling (temporary)  Light-headedness (temporary)  Pain at injection site (several day)  Decreased blood pressure (rare, temporary)  Weakness in leg (temporary)  Call if you experience:  Hives or difficulty breathing (go to the emergency room)  Inflammation or drainage at the injection site(s)  Please note:  Although the local anesthetic injected can often make your leg feel good for several hours after the injection,  The pain may return.  It takes 3-7 days for steroids to work.  You may not notice any pain relief for at least one week.  If effective, we will often do a series of injections spaced 3-6 weeks apart to maximally decrease your pain.  If you have any questions, please cll [phone] [facility]  Pain Management Discharge Instructions  General Discharge Instructions :  If you need to reach your doctor call: Monday-Friday 8:00 am - 4:00 pm at [phone] or toll free [phone].  After clinic hours [phone] to have operator reach doctor.  Bring all of your medication bottles to all your appointments in the pain clinic.  To cancel or reschedule your appointment with Pain Management please remember to call 24 hours in advance to avoid a fee.  Refer to the educational materials which you have been given on: General Risks, I had my Procedure. Discharge Instructions, Post Sedation.  Post Procedure Instructions:  The drugs you were given  will stay in your system until tomorrow, so for the next 24 hours you should not drive, make any legal decisions or drink any alcoholic beverages.  You may eat anything you prefer, but it is better to start with liquids then soups and crackers, and gradually work up to solid foods.  Please notify your doctor immediately if you have any unusual bleeding, trouble breathing or pain that is not related to your normal pain.  Depending on the type of procedure that was done, some parts of your body may feel week and/or numb.  This usually clears up by tonight or the next day.  Walk with the use of an assistive device or accompanied by an adult for the 24 hours.  You may use ice on the affected area for the first 24 hours.  Put ice in a Ziploc bag and cover  with a towel and place against area 15 minutes on 15 minutes off.  You may switch to heat after 24 hours.

## 2015-06-03 NOTE — Progress Notes (Signed)
Safety precautions to be maintained throughout the outpatient stay will include: orient to surroundings, keep bed in low position, maintain call bell within reach at all times, provide assistance with transfer out of bed and ambulation.  

## 2015-06-04 ENCOUNTER — Telehealth: Payer: Self-pay | Admitting: *Deleted

## 2015-06-04 NOTE — Telephone Encounter (Signed)
Patient verbalizes increased pain in leg, states she has used ice to affected leg and has taken gabapentin.  Denies any bleeding, loss of bowel or bladder, enouraged patient to give the steroids time to work and if things become worse to please call us back. Patient verbalizes u/o same.

## 2015-06-09 ENCOUNTER — Ambulatory Visit: Payer: Medicaid Other | Admitting: Pain Medicine

## 2015-06-22 ENCOUNTER — Other Ambulatory Visit: Payer: Self-pay

## 2015-07-02 ENCOUNTER — Encounter: Payer: Self-pay | Admitting: Pain Medicine

## 2015-07-02 ENCOUNTER — Ambulatory Visit: Payer: Medicaid Other | Attending: Pain Medicine | Admitting: Pain Medicine

## 2015-07-02 VITALS — BP 118/70 | HR 93 | Temp 98.2°F | Resp 16 | Ht 65.0 in | Wt 180.0 lb

## 2015-07-02 DIAGNOSIS — M47896 Other spondylosis, lumbar region: Secondary | ICD-10-CM | POA: Insufficient documentation

## 2015-07-02 DIAGNOSIS — G5712 Meralgia paresthetica, left lower limb: Secondary | ICD-10-CM

## 2015-07-02 DIAGNOSIS — G588 Other specified mononeuropathies: Secondary | ICD-10-CM

## 2015-07-02 DIAGNOSIS — M5137 Other intervertebral disc degeneration, lumbosacral region: Secondary | ICD-10-CM

## 2015-07-02 DIAGNOSIS — M4806 Spinal stenosis, lumbar region: Secondary | ICD-10-CM | POA: Diagnosis present

## 2015-07-02 DIAGNOSIS — M5126 Other intervertebral disc displacement, lumbar region: Secondary | ICD-10-CM | POA: Diagnosis not present

## 2015-07-02 DIAGNOSIS — M51379 Other intervertebral disc degeneration, lumbosacral region without mention of lumbar back pain or lower extremity pain: Secondary | ICD-10-CM

## 2015-07-02 DIAGNOSIS — M5416 Radiculopathy, lumbar region: Secondary | ICD-10-CM

## 2015-07-02 DIAGNOSIS — M533 Sacrococcygeal disorders, not elsewhere classified: Secondary | ICD-10-CM

## 2015-07-02 DIAGNOSIS — M79606 Pain in leg, unspecified: Secondary | ICD-10-CM | POA: Diagnosis present

## 2015-07-02 DIAGNOSIS — M47816 Spondylosis without myelopathy or radiculopathy, lumbar region: Secondary | ICD-10-CM

## 2015-07-02 DIAGNOSIS — M5116 Intervertebral disc disorders with radiculopathy, lumbar region: Secondary | ICD-10-CM | POA: Insufficient documentation

## 2015-07-02 DIAGNOSIS — M545 Low back pain: Secondary | ICD-10-CM | POA: Diagnosis present

## 2015-07-02 DIAGNOSIS — M48062 Spinal stenosis, lumbar region with neurogenic claudication: Secondary | ICD-10-CM

## 2015-07-02 MED ORDER — OXYCODONE HCL 5 MG PO TABS: ORAL_TABLET | ORAL | Status: DC

## 2015-07-02 NOTE — Progress Notes (Signed)
Safety precautions to be maintained throughout the outpatient stay will include: orient to surroundings, keep bed in low position, maintain call bell within reach at all times, provide assistance with transfer out of bed and ambulation.  

## 2015-07-02 NOTE — Progress Notes (Signed)
Subjective:    Patient ID: Gina Costa, female    DOB: [birth year], 55 y.o.   MRN: [id number]  HPI The patient is a 55 year old female who returns to pain management Center for further evaluation and treatment of pain involving the lower back and lower extremity region associated with weakness. We discussed patient's condition on today's visit and patient states that Dr. Posey Pronto and Elsworth Soho have schedule patient for neurosurgical evaluation of patient's lumbar and lower extremity pain paresthesias and weakness.. We discussed patient's condition and patient states that the pain is aggravated by standing walking and becomes more intense as patient spends time on the feet. The patient is without recent trauma or change in events of daily living the call significant change in symptomatology. We will proceed with caudal/lumbar epidural steroid injection at time return appointment in attempt to decrease severity of symptoms, minimize progression of symptoms, and avoid the need for more involved treatment. The patient was with understanding and agreed to suggested treatment plan. Resume the use of Neurontin and was unable to tolerate Lyrica. The patient will continue oxycodone. We've advised patient to follow-up with Dr. Melrose Nakayama as well for further neurological evaluation. The patient informed Dr. Melrose Nakayama that she was unable to tolerate Lyrica and is presently taking Neurontin. We will consider additional modifications of treatment pending response to treatment and follow-up evaluation. All were in agreement with suggested treatment plan.      Review of Systems     Objective:   Physical Exam There was tenderness to palpation of the paraspinal muscular region cervical region cervical facet region palpation which be produced pain of minimal degree with minimal tenderness over the splenius capitis and occipitalis musculature region. Palpation over the thoracic facet thoracic paraspinal must reason was with  mild tenderness to palpation of moderate tends to palpation with muscle spasm moderate degree in the lower thoracic paraspinal musculature region without crepitus of the thoracic region noted. The patient appeared to be with bilaterally equal grip strength and Tinel and Phalen's maneuver were without increase of pain of significant degree there was tenderness of the acromial clavicular and glenohumeral joint regions of mild degree and patient was at unremarkable Spurling's maneuver. Palpation over the lumbar paraspinal must reason lumbar facet region was attends to palpation of moderate to moderately severe degree with lateral bending rotation extension and palpation of the lumbar facets reproducing moderately severe discomfort. Straight leg raise was tolerates approximately 20 with questionably increased pain with dorsiflexion noted. There was questionably decreased sensation of the L5 dermatomal distribution with negative clonus negative Homans. DTRs were trace at the knees there was negative clonus negative Homans Abdomen nontender with no costovertebral tenderness noted       Assessment & Plan:      Degenerative disc disease lumbar spine Multilevel degenerative changes lumbar spine asymmetric edema of the right facet joint, L5-S1 level most involved, some progression since prior exam, multilevel facet arthropathy L2-3, L3-4, L4-5, and L5-S1. Disc bulging and stenosis noted on lumbar MRI  Lumbar radiculopathy  Lumbar facet syndrome  Lumbar stenosis with neurogenic claudication  Sacroiliac joint dysfunction      PLAN   Continue present medication oxycodone  Caudal epidural steroid injection to be performed at time of return appointment  F/U PCP Dr. Posey Pronto and Elsworth Soho for evaliation of  BP and general medical  condition  F/U surgical evaluation. Neurosurgical reevaluation as discussed. Please discuss neurosurgical evaluation with Dr. Posey Pronto and Elsworth Soho as we have discussed. Please have  them schedule  neurosurgical evaluation for you.   Please call them again today to see if they have scheduled your neurosurgical evaluation of your lower back and lower extremity pain  F/U neurological evaluation. Follow-up as discussed with Dr. Melrose Nakayama .  May consider radiofrequency rhizolysis or intraspinal procedures pending response to present treatment and F/U evaluation Pain Management  Call pain management prior to scheduled return appointment for any concerns regarding condition prior to scheduled return appointmen

## 2015-07-02 NOTE — Patient Instructions (Addendum)
PLAN   Continue present medication oxycodone  Caudal epidural steroid injection to be performed at time of return appointment  F/U PCP Dr. Posey Pronto and Elsworth Soho for evaliation of  BP and general medical  condition  F/U surgical evaluation. Neurosurgical reevaluation as discussed. Please discuss neurosurgical evaluation with Dr. Posey Pronto and Elsworth Soho as we have discussed. Please have them schedule neurosurgical evaluation for you.   Please call them again today to see if they have scheduled your neurosurgical evaluation of your lower back and lower extremity pain  F/U neurological evaluation. Follow-up as discussed with Dr. Melrose Nakayama .  May consider radiofrequency rhizolysis or intraspinal procedures pending response to present treatment and F/U evaluation Pain Management  Call pain management prior to scheduled return appointment for any concerns regarding condition prior to scheduled return appointmentGENERAL RISKS AND COMPLICATIONS  What are the risk, side effects and possible complications? Generally speaking, most procedures are safe.  However, with any procedure there are risks, side effects, and the possibility of complications.  The risks and complications are dependent upon the sites that are lesioned, or the type of nerve block to be performed.  The closer the procedure is to the spine, the more serious the risks are.  Great care is taken when placing the radio frequency needles, block needles or lesioning probes, but sometimes complications can occur. 1. Infection: Any time there is an injection through the skin, there is a risk of infection.  This is why sterile conditions are used for these blocks.  There are four possible types of infection. 1. Localized skin infection. 2. Central Nervous System Infection-This can be in the form of Meningitis, which can be deadly. 3. Epidural Infections-This can be in the form of an epidural abscess, which can cause pressure inside of the spine, causing  compression of the spinal cord with subsequent paralysis. This would require an emergency surgery to decompress, and there are no guarantees that the patient would recover from the paralysis. 4. Discitis-This is an infection of the intervertebral discs.  It occurs in about 1% of discography procedures.  It is difficult to treat and it may lead to surgery.        2. Pain: the needles have to go through skin and soft tissues, will cause soreness.       3. Damage to internal structures:  The nerves to be lesioned may be near blood vessels or    other nerves which can be potentially damaged.       4. Bleeding: Bleeding is more common if the patient is taking blood thinners such as  aspirin, Coumadin, Ticiid, Plavix, etc., or if he/she have some genetic predisposition  such as hemophilia. Bleeding into the spinal canal can cause compression of the spinal  cord with subsequent paralysis.  This would require an emergency surgery to  decompress and there are no guarantees that the patient would recover from the  paralysis.       5. Pneumothorax:  Puncturing of a lung is a possibility, every time a needle is introduced in  the area of the chest or upper back.  Pneumothorax refers to free air around the  collapsed lung(s), inside of the thoracic cavity (chest cavity).  Another two possible  complications related to a similar event would include: Hemothorax and Chylothorax.   These are variations of the Pneumothorax, where instead of air around the collapsed  lung(s), you may have blood or chyle, respectively.       6. Spinal headaches: They  may occur with any procedures in the area of the spine.       7. Persistent CSF (Cerebro-Spinal Fluid) leakage: This is a rare problem, but may occur  with prolonged intrathecal or epidural catheters either due to the formation of a fistulous  track or a dural tear.       8. Nerve damage: By working so close to the spinal cord, there is always a possibility of  nerve damage,  which could be as serious as a permanent spinal cord injury with  paralysis.       9. Death:  Although rare, severe deadly allergic reactions known as "Anaphylactic  reaction" can occur to any of the medications used.      10. Worsening of the symptoms:  We can always make thing worse.  What are the chances of something like this happening? Chances of any of this occuring are extremely low.  By statistics, you have more of a chance of getting killed in a motor vehicle accident: while driving to the hospital than any of the above occurring .  Nevertheless, you should be aware that they are possibilities.  In general, it is similar to taking a shower.  Everybody knows that you can slip, hit your head and get killed.  Does that mean that you should not shower again?  Nevertheless always keep in mind that statistics do not mean anything if you happen to be on the wrong side of them.  Even if a procedure has a 1 (one) in a 1,000,000 (million) chance of going wrong, it you happen to be that one..Also, keep in mind that by statistics, you have more of a chance of having something go wrong when taking medications.  Who should not have this procedure? If you are on a blood thinning medication (e.g. Coumadin, Plavix, see list of "Blood Thinners"), or if you have an active infection going on, you should not have the procedure.  If you are taking any blood thinners, please inform your physician.  How should I prepare for this procedure?  Do not eat or drink anything at least six hours prior to the procedure.  Bring a driver with you .  It cannot be a taxi.  Come accompanied by an adult that can drive you back, and that is strong enough to help you if your legs get weak or numb from the local anesthetic.  Take all of your medicines the morning of the procedure with just enough water to swallow them.  If you have diabetes, make sure that you are scheduled to have your procedure done first thing in the morning,  whenever possible.  If you have diabetes, take only half of your insulin dose and notify our nurse that you have done so as soon as you arrive at the clinic.  If you are diabetic, but only take blood sugar pills (oral hypoglycemic), then do not take them on the morning of your procedure.  You may take them after you have had the procedure.  Do not take aspirin or any aspirin-containing medications, at least eleven (11) days prior to the procedure.  They may prolong bleeding.  Wear loose fitting clothing that may be easy to take off and that you would not mind if it got stained with Betadine or blood.  Do not wear any jewelry or perfume  Remove any nail coloring.  It will interfere with some of our monitoring equipment.  NOTE: Remember that this is not meant to be  interpreted as a complete list of all possible complications.  Unforeseen problems may occur.  BLOOD THINNERS The following drugs contain aspirin or other products, which can cause increased bleeding during surgery and should not be taken for 2 weeks prior to and 1 week after surgery.  If you should need take something for relief of minor pain, you may take acetaminophen which is found in Tylenol,m Datril, Anacin-3 and Panadol. It is not blood thinner. The products listed below are.  Do not take any of the products listed below in addition to any listed on your instruction sheet.  A.P.C or A.P.C with Codeine Codeine Phosphate Capsules #3 Ibuprofen Ridaura  ABC compound Congesprin Imuran rimadil  Advil Cope Indocin Robaxisal  Alka-Seltzer Effervescent Pain Reliever and Antacid Coricidin or Coricidin-D  Indomethacin Rufen  Alka-Seltzer plus Cold Medicine Cosprin Ketoprofen S-A-C Tablets  Anacin Analgesic Tablets or Capsules Coumadin Korlgesic Salflex  Anacin Extra Strength Analgesic tablets or capsules CP-2 Tablets Lanoril Salicylate  Anaprox Cuprimine Capsules Levenox Salocol  Anexsia-D Dalteparin Magan Salsalate  Anodynos Darvon  compound Magnesium Salicylate Sine-off  Ansaid Dasin Capsules Magsal Sodium Salicylate  Anturane Depen Capsules Marnal Soma  APF Arthritis pain formula Dewitt's Pills Measurin Stanback  Argesic Dia-Gesic Meclofenamic Sulfinpyrazone  Arthritis Bayer Timed Release Aspirin Diclofenac Meclomen Sulindac  Arthritis pain formula Anacin Dicumarol Medipren Supac  Analgesic (Safety coated) Arthralgen Diffunasal Mefanamic Suprofen  Arthritis Strength Bufferin Dihydrocodeine Mepro Compound Suprol  Arthropan liquid Dopirydamole Methcarbomol with Aspirin Synalgos  ASA tablets/Enseals Disalcid Micrainin Tagament  Ascriptin Doan's Midol Talwin  Ascriptin A/D Dolene Mobidin Tanderil  Ascriptin Extra Strength Dolobid Moblgesic Ticlid  Ascriptin with Codeine Doloprin or Doloprin with Codeine Momentum Tolectin  Asperbuf Duoprin Mono-gesic Trendar  Aspergum Duradyne Motrin or Motrin IB Triminicin  Aspirin plain, buffered or enteric coated Durasal Myochrisine Trigesic  Aspirin Suppositories Easprin Nalfon Trillsate  Aspirin with Codeine Ecotrin Regular or Extra Strength Naprosyn Uracel  Atromid-S Efficin Naproxen Ursinus  Auranofin Capsules Elmiron Neocylate Vanquish  Axotal Emagrin Norgesic Verin  Azathioprine Empirin or Empirin with Codeine Normiflo Vitamin E  Azolid Emprazil Nuprin Voltaren  Bayer Aspirin plain, buffered or children's or timed BC Tablets or powders Encaprin Orgaran Warfarin Sodium  Buff-a-Comp Enoxaparin Orudis Zorpin  Buff-a-Comp with Codeine Equegesic Os-Cal-Gesic   Buffaprin Excedrin plain, buffered or Extra Strength Oxalid   Bufferin Arthritis Strength Feldene Oxphenbutazone   Bufferin plain or Extra Strength Feldene Capsules Oxycodone with Aspirin   Bufferin with Codeine Fenoprofen Fenoprofen Pabalate or Pabalate-SF   Buffets II Flogesic Panagesic   Buffinol plain or Extra Strength Florinal or Florinal with Codeine Panwarfarin   Buf-Tabs Flurbiprofen Penicillamine   Butalbital  Compound Four-way cold tablets Penicillin   Butazolidin Fragmin Pepto-Bismol   Carbenicillin Geminisyn Percodan   Carna Arthritis Reliever Geopen Persantine   Carprofen Gold's salt Persistin   Chloramphenicol Goody's Phenylbutazone   Chloromycetin Haltrain Piroxlcam   Clmetidine heparin Plaquenil   Cllnoril Hyco-pap Ponstel   Clofibrate Hydroxy chloroquine Propoxyphen         Before stopping any of these medications, be sure to consult the physician who ordered them.  Some, such as Coumadin (Warfarin) are ordered to prevent or treat serious conditions such as "deep thrombosis", "pumonary embolisms", and other heart problems.  The amount of time that you may need off of the medication may also vary with the medication and the reason for which you were taking it.  If you are taking any of these medications, please make sure you notify your pain  physician before you undergo any procedures.         Epidural Steroid Injection Patient Information  Description: The epidural space surrounds the nerves as they exit the spinal cord.  In some patients, the nerves can be compressed and inflamed by a bulging disc or a tight spinal canal (spinal stenosis).  By injecting steroids into the epidural space, we can bring irritated nerves into direct contact with a potentially helpful medication.  These steroids act directly on the irritated nerves and can reduce swelling and inflammation which often leads to decreased pain.  Epidural steroids may be injected anywhere along the spine and from the neck to the low back depending upon the location of your pain.   After numbing the skin with local anesthetic (like Novocaine), a small needle is passed into the epidural space slowly.  You may experience a sensation of pressure while this is being done.  The entire block usually last less than 10 minutes.  Conditions which may be treated by epidural steroids:   Low back and leg pain  Neck and arm pain  Spinal  stenosis  Post-laminectomy syndrome  Herpes zoster (shingles) pain  Pain from compression fractures  Preparation for the injection:  1. Do not eat any solid food or dairy products within 6 hours of your appointment.  2. You may drink clear liquids up to 2 hours before appointment.  Clear liquids include water, black coffee, juice or soda.  No milk or cream please. 3. You may take your regular medication, including pain medications, with a sip of water before your appointment  Diabetics should hold regular insulin (if taken separately) and take 1/2 normal NPH dos the morning of the procedure.  Carry some sugar containing items with you to your appointment. 4. A driver must accompany you and be prepared to drive you home after your procedure.  5. Bring all your current medications with your. 6. An IV may be inserted and sedation may be given at the discretion of the physician.   7. A blood pressure cuff, EKG and other monitors will often be applied during the procedure.  Some patients may need to have extra oxygen administered for a short period. 8. You will be asked to provide medical information, including your allergies, prior to the procedure.  We must know immediately if you are taking blood thinners (like Coumadin/Warfarin)  Or if you are allergic to IV iodine contrast (dye). We must know if you could possible be pregnant.  Possible side-effects:  Bleeding from needle site  Infection (rare, may require surgery)  Nerve injury (rare)  Numbness & tingling (temporary)  Difficulty urinating (rare, temporary)  Spinal headache ( a headache worse with upright posture)  Light -headedness (temporary)  Pain at injection site (several days)  Decreased blood pressure (temporary)  Weakness in arm/leg (temporary)  Pressure sensation in back/neck (temporary)  Call if you experience:  Fever/chills associated with headache or increased back/neck pain.  Headache worsened by an upright  position.  New onset weakness or numbness of an extremity below the injection site  Hives or difficulty breathing (go to the emergency room)  Inflammation or drainage at the infection site  Severe back/neck pain  Any new symptoms which are concerning to you  Please note:  Although the local anesthetic injected can often make your back or neck feel good for several hours after the injection, the pain will likely return.  It takes 3-7 days for steroids to work in the epidural space.  You may not notice any pain relief for at least that one week.  If effective, we will often do a series of three injections spaced 3-6 weeks apart to maximally decrease your pain.  After the initial series, we generally will wait several months before considering a repeat injection of the same type.  If you have any questions, please call [phone] [facility]

## 2015-07-15 ENCOUNTER — Encounter: Payer: Self-pay | Admitting: Pain Medicine

## 2015-07-15 ENCOUNTER — Ambulatory Visit: Payer: Medicaid Other | Attending: Pain Medicine | Admitting: Pain Medicine

## 2015-07-15 VITALS — BP 114/52 | HR 83 | Temp 98.3°F | Resp 16 | Ht 66.0 in | Wt 180.0 lb

## 2015-07-15 DIAGNOSIS — M533 Sacrococcygeal disorders, not elsewhere classified: Secondary | ICD-10-CM

## 2015-07-15 DIAGNOSIS — M545 Low back pain: Secondary | ICD-10-CM | POA: Diagnosis present

## 2015-07-15 DIAGNOSIS — M47816 Spondylosis without myelopathy or radiculopathy, lumbar region: Secondary | ICD-10-CM

## 2015-07-15 DIAGNOSIS — G5712 Meralgia paresthetica, left lower limb: Secondary | ICD-10-CM

## 2015-07-15 DIAGNOSIS — M79606 Pain in leg, unspecified: Secondary | ICD-10-CM | POA: Diagnosis present

## 2015-07-15 DIAGNOSIS — M5136 Other intervertebral disc degeneration, lumbar region: Secondary | ICD-10-CM | POA: Insufficient documentation

## 2015-07-15 DIAGNOSIS — G588 Other specified mononeuropathies: Secondary | ICD-10-CM

## 2015-07-15 DIAGNOSIS — M48062 Spinal stenosis, lumbar region with neurogenic claudication: Secondary | ICD-10-CM

## 2015-07-15 DIAGNOSIS — M4806 Spinal stenosis, lumbar region: Secondary | ICD-10-CM | POA: Diagnosis not present

## 2015-07-15 DIAGNOSIS — M5137 Other intervertebral disc degeneration, lumbosacral region: Secondary | ICD-10-CM

## 2015-07-15 DIAGNOSIS — M5416 Radiculopathy, lumbar region: Secondary | ICD-10-CM

## 2015-07-15 DIAGNOSIS — M5126 Other intervertebral disc displacement, lumbar region: Secondary | ICD-10-CM | POA: Insufficient documentation

## 2015-07-15 MED ORDER — TRIAMCINOLONE ACETONIDE 40 MG/ML IJ SUSP
INTRAMUSCULAR | Status: AC
  Administered 2015-07-15: 09:00:00
  Filled 2015-07-15: qty 1

## 2015-07-15 MED ORDER — DOXYCYCLINE HYCLATE 100 MG PO TABS: 100.0000 mg | ORAL_TABLET | Freq: Two times a day (BID) | ORAL | Status: DC

## 2015-07-15 MED ORDER — LIDOCAINE HCL (PF) 1 % IJ SOLN
INTRAMUSCULAR | Status: AC
  Administered 2015-07-15: 09:00:00
  Filled 2015-07-15: qty 5

## 2015-07-15 MED ORDER — SODIUM CHLORIDE 0.9 % IJ SOLN
INTRAMUSCULAR | Status: AC
  Administered 2015-07-15: 09:00:00
  Filled 2015-07-15: qty 20

## 2015-07-15 MED ORDER — TRIAMCINOLONE ACETONIDE 40 MG/ML IJ SUSP: 40.0000 mg | Freq: Once | INTRAMUSCULAR | Status: DC

## 2015-07-15 MED ORDER — LIDOCAINE HCL (PF) 1 % IJ SOLN: 10.0000 mL | Freq: Once | INTRAMUSCULAR | Status: DC

## 2015-07-15 MED ORDER — SODIUM CHLORIDE 0.9% FLUSH: 20.0000 mL | Freq: Once | INTRAVENOUS | Status: DC

## 2015-07-15 NOTE — Progress Notes (Signed)
   Subjective:    Patient ID: Gina Costa, female    DOB: 01-23-1961, 55 y.o.   MRN: [id number]  HPI                                            CAUDAL EPIDURAL STEROID  The patient is a 55 year old female who returns to pain management for further evaluation and treatment of pain involving the lower back and lower extremity regions pri MRI revealed patient to be with Degenerative disc disease lumbar spine Multilevel degenerative changes lumbar spine asymmetric edema of the right facet joint, L5-S1 level most involved, some progression since prior exam, multilevel facet arthropathy L2-3, L3-4, L4-5, and L5-S1. Disc bulging and stenosis noted on lumbar MRI. The patient is with severely disabling lower back lower extremity pain paresthesias and with weakness as well. There is concern regarding intraspinal abnormalities contributing to patient's symptomatology with concern regarding lumbar radiculopathy and lumbar stenosis as well as lumbar facet syndrome decision has been made to proceed with caudal epidural steroid injection in attempt to decrease the symptoms, minimize progression of patient's symptoms and avoid need for more involved treatment. The risks benefits and expectations of procedure were discussed and explained to patient who was with understanding and in agreement with suggested treatment plan.   Description Of Procedure:  Caudal Epidural Steroid Injection  The patient was taken to fluoroscopy suite and assumed the prone position. EKG, blood pressure, pulse, and pulse oximeter monitors were all in place. Betadine prep of proposed entry site was accomplished. . A 22-gauge needle was placed in the caudal canal with with negative heme and negative CSF return documentation of needle placement confirmed with fluoroscopy. A total of 20 cc of preservative-free normal saline with 40 mg of Kenalog was injected incrementally via 22-gauge needle placed in the caudal canal.   The patient tolerated  the procedure well    .  PLAN  Continue present medication oxycodone. Please get doxycycline antibiotic today and begin taking doxycycline antibiotic today as prescribed  F/U PCP Dr. Posey Pronto and Elsworth Soho for evaliation of  BP and general medical  condition  F/U surgical evaluation. Neurosurgical reevaluation as discussed  F/U neurological evaluation. Follow-up as discussed  May consider radiofrequency rhizolysis or intraspinal procedures pending response to present treatment and F/U evaluation  Patient is to call pain management prior to scheduled return appointment should there be any concerns regarding condition       Review of Systems     Objective:   Physical Exam        Assessment & Plan:

## 2015-07-15 NOTE — Patient Instructions (Addendum)
Continue present medication oxycodone. Please get doxycycline antibiotic today and begin taking doxycycline antibiotic today as prescribed  F/U PCP Dr. Posey Pronto and Elsworth Soho for evaliation of  BP and general medical  condition  F/U surgical evaluation. Neurosurgical reevaluation as discussed  F/U neurological evaluation. Follow-up as discussed  May consider radiofrequency rhizolysis or intraspinal procedures pending response to present treatment and F/U evaluation  Patient is to call pain management prior to scheduled return appointment should there be any concerns regarding conditionGENERAL RISKS AND COMPLICATIONS  What are the risk, side effects and possible complications? Generally speaking, most procedures are safe.  However, with any procedure there are risks, side effects, and the possibility of complications.  The risks and complications are dependent upon the sites that are lesioned, or the type of nerve block to be performed.  The closer the procedure is to the spine, the more serious the risks are.  Great care is taken when placing the radio frequency needles, block needles or lesioning probes, but sometimes complications can occur. 1. Infection: Any time there is an injection through the skin, there is a risk of infection.  This is why sterile conditions are used for these blocks.  There are four possible types of infection. 1. Localized skin infection. 2. Central Nervous System Infection-This can be in the form of Meningitis, which can be deadly. 3. Epidural Infections-This can be in the form of an epidural abscess, which can cause pressure inside of the spine, causing compression of the spinal cord with subsequent paralysis. This would require an emergency surgery to decompress, and there are no guarantees that the patient would recover from the paralysis. 4. Discitis-This is an infection of the intervertebral discs.  It occurs in about 1% of discography procedures.  It is difficult to treat  and it may lead to surgery.        2. Pain: the needles have to go through skin and soft tissues, will cause soreness.       3. Damage to internal structures:  The nerves to be lesioned may be near blood vessels or    other nerves which can be potentially damaged.       4. Bleeding: Bleeding is more common if the patient is taking blood thinners such as  aspirin, Coumadin, Ticiid, Plavix, etc., or if he/she have some genetic predisposition  such as hemophilia. Bleeding into the spinal canal can cause compression of the spinal  cord with subsequent paralysis.  This would require an emergency surgery to  decompress and there are no guarantees that the patient would recover from the  paralysis.       5. Pneumothorax:  Puncturing of a lung is a possibility, every time a needle is introduced in  the area of the chest or upper back.  Pneumothorax refers to free air around the  collapsed lung(s), inside of the thoracic cavity (chest cavity).  Another two possible  complications related to a similar event would include: Hemothorax and Chylothorax.   These are variations of the Pneumothorax, where instead of air around the collapsed  lung(s), you may have blood or chyle, respectively.       6. Spinal headaches: They may occur with any procedures in the area of the spine.       7. Persistent CSF (Cerebro-Spinal Fluid) leakage: This is a rare problem, but may occur  with prolonged intrathecal or epidural catheters either due to the formation of a fistulous  track or a dural tear.  8. Nerve damage: By working so close to the spinal cord, there is always a possibility of  nerve damage, which could be as serious as a permanent spinal cord injury with  paralysis.       9. Death:  Although rare, severe deadly allergic reactions known as "Anaphylactic  reaction" can occur to any of the medications used.      10. Worsening of the symptoms:  We can always make thing worse.  What are the chances of something like this  happening? Chances of any of this occuring are extremely low.  By statistics, you have more of a chance of getting killed in a motor vehicle accident: while driving to the hospital than any of the above occurring .  Nevertheless, you should be aware that they are possibilities.  In general, it is similar to taking a shower.  Everybody knows that you can slip, hit your head and get killed.  Does that mean that you should not shower again?  Nevertheless always keep in mind that statistics do not mean anything if you happen to be on the wrong side of them.  Even if a procedure has a 1 (one) in a 1,000,000 (million) chance of going wrong, it you happen to be that one..Also, keep in mind that by statistics, you have more of a chance of having something go wrong when taking medications.  Who should not have this procedure? If you are on a blood thinning medication (e.g. Coumadin, Plavix, see list of "Blood Thinners"), or if you have an active infection going on, you should not have the procedure.  If you are taking any blood thinners, please inform your physician.  How should I prepare for this procedure?  Do not eat or drink anything at least six hours prior to the procedure.  Bring a driver with you .  It cannot be a taxi.  Come accompanied by an adult that can drive you back, and that is strong enough to help you if your legs get weak or numb from the local anesthetic.  Take all of your medicines the morning of the procedure with just enough water to swallow them.  If you have diabetes, make sure that you are scheduled to have your procedure done first thing in the morning, whenever possible.  If you have diabetes, take only half of your insulin dose and notify our nurse that you have done so as soon as you arrive at the clinic.  If you are diabetic, but only take blood sugar pills (oral hypoglycemic), then do not take them on the morning of your procedure.  You may take them after you have had the  procedure.  Do not take aspirin or any aspirin-containing medications, at least eleven (11) days prior to the procedure.  They may prolong bleeding.  Wear loose fitting clothing that may be easy to take off and that you would not mind if it got stained with Betadine or blood.  Do not wear any jewelry or perfume  Remove any nail coloring.  It will interfere with some of our monitoring equipment.  NOTE: Remember that this is not meant to be interpreted as a complete list of all possible complications.  Unforeseen problems may occur.  BLOOD THINNERS The following drugs contain aspirin or other products, which can cause increased bleeding during surgery and should not be taken for 2 weeks prior to and 1 week after surgery.  If you should need take something for relief of minor  pain, you may take acetaminophen which is found in Tylenol,m Datril, Anacin-3 and Panadol. It is not blood thinner. The products listed below are.  Do not take any of the products listed below in addition to any listed on your instruction sheet.  A.P.C or A.P.C with Codeine Codeine Phosphate Capsules #3 Ibuprofen Ridaura  ABC compound Congesprin Imuran rimadil  Advil Cope Indocin Robaxisal  Alka-Seltzer Effervescent Pain Reliever and Antacid Coricidin or Coricidin-D  Indomethacin Rufen  Alka-Seltzer plus Cold Medicine Cosprin Ketoprofen S-A-C Tablets  Anacin Analgesic Tablets or Capsules Coumadin Korlgesic Salflex  Anacin Extra Strength Analgesic tablets or capsules CP-2 Tablets Lanoril Salicylate  Anaprox Cuprimine Capsules Levenox Salocol  Anexsia-D Dalteparin Magan Salsalate  Anodynos Darvon compound Magnesium Salicylate Sine-off  Ansaid Dasin Capsules Magsal Sodium Salicylate  Anturane Depen Capsules Marnal Soma  APF Arthritis pain formula Dewitt's Pills Measurin Stanback  Argesic Dia-Gesic Meclofenamic Sulfinpyrazone  Arthritis Bayer Timed Release Aspirin Diclofenac Meclomen Sulindac  Arthritis pain formula  Anacin Dicumarol Medipren Supac  Analgesic (Safety coated) Arthralgen Diffunasal Mefanamic Suprofen  Arthritis Strength Bufferin Dihydrocodeine Mepro Compound Suprol  Arthropan liquid Dopirydamole Methcarbomol with Aspirin Synalgos  ASA tablets/Enseals Disalcid Micrainin Tagament  Ascriptin Doan's Midol Talwin  Ascriptin A/D Dolene Mobidin Tanderil  Ascriptin Extra Strength Dolobid Moblgesic Ticlid  Ascriptin with Codeine Doloprin or Doloprin with Codeine Momentum Tolectin  Asperbuf Duoprin Mono-gesic Trendar  Aspergum Duradyne Motrin or Motrin IB Triminicin  Aspirin plain, buffered or enteric coated Durasal Myochrisine Trigesic  Aspirin Suppositories Easprin Nalfon Trillsate  Aspirin with Codeine Ecotrin Regular or Extra Strength Naprosyn Uracel  Atromid-S Efficin Naproxen Ursinus  Auranofin Capsules Elmiron Neocylate Vanquish  Axotal Emagrin Norgesic Verin  Azathioprine Empirin or Empirin with Codeine Normiflo Vitamin E  Azolid Emprazil Nuprin Voltaren  Bayer Aspirin plain, buffered or children's or timed BC Tablets or powders Encaprin Orgaran Warfarin Sodium  Buff-a-Comp Enoxaparin Orudis Zorpin  Buff-a-Comp with Codeine Equegesic Os-Cal-Gesic   Buffaprin Excedrin plain, buffered or Extra Strength Oxalid   Bufferin Arthritis Strength Feldene Oxphenbutazone   Bufferin plain or Extra Strength Feldene Capsules Oxycodone with Aspirin   Bufferin with Codeine Fenoprofen Fenoprofen Pabalate or Pabalate-SF   Buffets II Flogesic Panagesic   Buffinol plain or Extra Strength Florinal or Florinal with Codeine Panwarfarin   Buf-Tabs Flurbiprofen Penicillamine   Butalbital Compound Four-way cold tablets Penicillin   Butazolidin Fragmin Pepto-Bismol   Carbenicillin Geminisyn Percodan   Carna Arthritis Reliever Geopen Persantine   Carprofen Gold's salt Persistin   Chloramphenicol Goody's Phenylbutazone   Chloromycetin Haltrain Piroxlcam   Clmetidine heparin Plaquenil   Cllnoril Hyco-pap  Ponstel   Clofibrate Hydroxy chloroquine Propoxyphen         Before stopping any of these medications, be sure to consult the physician who ordered them.  Some, such as Coumadin (Warfarin) are ordered to prevent or treat serious conditions such as "deep thrombosis", "pumonary embolisms", and other heart problems.  The amount of time that you may need off of the medication may also vary with the medication and the reason for which you were taking it.  If you are taking any of these medications, please make sure you notify your pain physician before you undergo any procedures.

## 2015-07-15 NOTE — Progress Notes (Signed)
Safety precautions to be maintained throughout the outpatient stay will include: orient to surroundings, keep bed in low position, maintain call bell within reach at all times, provide assistance with transfer out of bed and ambulation.  

## 2015-07-16 ENCOUNTER — Telehealth: Payer: Self-pay | Admitting: *Deleted

## 2015-07-16 NOTE — Telephone Encounter (Signed)
Patient verbalizes no complications from procedure on yesterday. 

## 2015-07-30 ENCOUNTER — Ambulatory Visit: Payer: Medicaid Other | Attending: Pain Medicine | Admitting: Pain Medicine

## 2015-07-30 ENCOUNTER — Encounter: Payer: Self-pay | Admitting: Pain Medicine

## 2015-07-30 VITALS — BP 117/63 | HR 77 | Temp 96.7°F | Resp 16 | Ht 66.0 in | Wt 180.0 lb

## 2015-07-30 DIAGNOSIS — M79606 Pain in leg, unspecified: Secondary | ICD-10-CM | POA: Diagnosis present

## 2015-07-30 DIAGNOSIS — M533 Sacrococcygeal disorders, not elsewhere classified: Secondary | ICD-10-CM | POA: Diagnosis not present

## 2015-07-30 DIAGNOSIS — M47896 Other spondylosis, lumbar region: Secondary | ICD-10-CM | POA: Diagnosis not present

## 2015-07-30 DIAGNOSIS — M1288 Other specific arthropathies, not elsewhere classified, other specified site: Secondary | ICD-10-CM | POA: Insufficient documentation

## 2015-07-30 DIAGNOSIS — M5137 Other intervertebral disc degeneration, lumbosacral region: Secondary | ICD-10-CM

## 2015-07-30 DIAGNOSIS — M5416 Radiculopathy, lumbar region: Secondary | ICD-10-CM

## 2015-07-30 DIAGNOSIS — M545 Low back pain: Secondary | ICD-10-CM | POA: Diagnosis present

## 2015-07-30 DIAGNOSIS — G588 Other specified mononeuropathies: Secondary | ICD-10-CM

## 2015-07-30 DIAGNOSIS — M4806 Spinal stenosis, lumbar region: Secondary | ICD-10-CM | POA: Diagnosis not present

## 2015-07-30 DIAGNOSIS — G5712 Meralgia paresthetica, left lower limb: Secondary | ICD-10-CM

## 2015-07-30 DIAGNOSIS — M48062 Spinal stenosis, lumbar region with neurogenic claudication: Secondary | ICD-10-CM

## 2015-07-30 DIAGNOSIS — M5116 Intervertebral disc disorders with radiculopathy, lumbar region: Secondary | ICD-10-CM | POA: Diagnosis not present

## 2015-07-30 DIAGNOSIS — M47816 Spondylosis without myelopathy or radiculopathy, lumbar region: Secondary | ICD-10-CM

## 2015-07-30 MED ORDER — OXYCODONE HCL 5 MG PO TABS: ORAL_TABLET | ORAL | Status: DC

## 2015-07-30 NOTE — Progress Notes (Signed)
Subjective:    Patient ID: Gina Costa, female    DOB: 03/24/1961, 55 y.o.   MRN: [id number]  HPI The patient is a 55 year old female who returns to pain management for further evaluation and treatment of lower back and lower extremity pain paresthesias and weakness. The patient is status post caudal epidural steroid injection . The patient states she has some improvement of her symptoms following the procedure. The patient states that the pain at the present time involves the buttocks and radiates to the left and right buttocks there is aggravated by standing walking and any activities on the feet. There is concern regarding significant component of patient's pain being due to sacroiliac joint involvement as well as intraspinal abnormalities. The patient denies any trauma change in events of daily living the call significant change in symptomatology. We will proceed with block of nerves to the sacroiliac joint at time of return appointment in attempt to decrease severity of symptoms, minimize progression of symptoms, and avoid the need for more involved treatment. The patient agreed to suggested treatment plan.   Review of Systems     Objective:   Physical Exam  There was tenderness to palpation of the paraspinal must reason cervical region cervical facet region a mild degree with mild tenderness over the splenius capitis and occipitalis musculature regions. There was mild tenderness over the thoracic facet thoracic paraspinal musculature region in the upper and mid thoracic regions with moderate tenderness to palpation over the lower thoracic paraspinal musculature region with no crepitus of the thoracic region noted. She appeared to be with bilaterally equal grip strength and Tinel and Phalen's maneuver were without increased pain of significant degree. Palpation over the lumbar paraspinal musculatures and lumbar facet region was with moderate tenderness to palpation with moderate tenderness  to palpation over the lumbar facets with lateral bending rotation extension and palpation over the lumbar facet region. There was moderately severe tenderness to palpation of the PSIS and PII S region as well as the gluteal and piriformis musculature regions. Straight leg raising was tolerates approximately 20 without increased pain with dorsiflexion noted. There appeared to be negative clonus and negative Homans. DTRs were difficult to elicit. Patient had difficulty relaxing. Palpation of the greater trochanteric region and iliotibial band region reproduced mild discomfort. There was negative clonus and negative Homans. There was no abdominal tends to palpation and no costovertebral tenderness was noted      Assessment & Plan:      Degenerative disc disease lumbar spine Multilevel degenerative changes lumbar spine asymmetric edema of the right facet joint, L5-S1 level most involved, some progression since prior exam, multilevel facet arthropathy L2-3, L3-4, L4-5, and L5-S1. Disc bulging and stenosis noted on lumbar MRI  Lumbar radiculopathy  Lumbar facet syndrome  Lumbar stenosis with neurogenic claudication  Sacroiliac joint dysfunction      PLAN   Continue present medication oxycodone  Block of nerves to the sacroiliac joint to be performed at time of return appointment  F/U PCP Dr. Posey Pronto and Elsworth Soho for evaliation of  BP and general medical  condition  F/U surgical evaluation. Neurosurgical reevaluation as discussed. Please discuss neurosurgical evaluation with Dr. Posey Pronto and Elsworth Soho as we have discussed. Please have them schedule neurosurgical evaluation for you as previously discussed  F/U neurological evaluation. Follow-up as discussed with Dr. Melrose Nakayama .  May consider radiofrequency rhizolysis or intraspinal procedures pending response to present treatment and F/U evaluation Pain Management  Call pain management prior to scheduled return  appointment for any concerns regarding  condition prior to scheduled return appointment

## 2015-07-30 NOTE — Progress Notes (Signed)
Safety precautions to be maintained throughout the outpatient stay will include: orient to surroundings, keep bed in low position, maintain call bell within reach at all times, provide assistance with transfer out of bed and ambulation.  

## 2015-07-30 NOTE — Patient Instructions (Addendum)
PLAN   Continue present medication oxycodone  Block of nerves to the sacroiliac joint to be performed at time of return appointment  F/U PCP Dr. Posey Pronto and Elsworth Soho for evaliation of  BP and general medical  condition  F/U surgical evaluation. Neurosurgical reevaluation as discussed. Please discuss neurosurgical evaluation with Dr. Posey Pronto and Elsworth Soho as we have discussed. Please have them schedule neurosurgical evaluation for you as previously discussed  F/U neurological evaluation. Follow-up as discussed with Dr. Melrose Nakayama .  May consider radiofrequency rhizolysis or intraspinal procedures pending response to present treatment and F/U evaluation Pain Management  Call pain management prior to scheduled return appointment for any concerns regarding condition prior to scheduled return appointmentSacroiliac (SI) Joint Injection Patient Information  Description: The sacroiliac joint connects the scrum (very low back and tailbone) to the ilium (a pelvic bone which also forms half of the hip joint).  Normally this joint experiences very little motion.  When this joint becomes inflamed or unstable low back and or hip and pelvis pain may result.  Injection of this joint with local anesthetics (numbing medicines) and steroids can provide diagnostic information and reduce pain.  This injection is performed with the aid of x-ray guidance into the tailbone area while you are lying on your stomach.   You may experience an electrical sensation down the leg while this is being done.  You may also experience numbness.  We also may ask if we are reproducing your normal pain during the injection.  Conditions which may be treated SI injection:   Low back, buttock, hip or leg pain  Preparation for the Injection:  1. Do not eat any solid food or dairy products within 8 hours of your appointment.  2. You may drink clear liquids up to 3 hours before appointment.  Clear liquids include water, black coffee, juice or soda.   No milk or cream please. 3. You may take your regular medications, including pain medications with a sip of water before your appointment.  Diabetics should hold regular insulin (if take separately) and take 1/2 normal NPH dose the morning of the procedure.  Carry some sugar containing items with you to your appointment. 4. A driver must accompany you and be prepared to drive you home after your procedure. 5. Bring all of your current medications with you. 6. An IV may be inserted and sedation may be given at the discretion of the physician. 7. A blood pressure cuff, EKG and other monitors will often be applied during the procedure.  Some patients may need to have extra oxygen administered for a short period.  8. You will be asked to provide medical information, including your allergies, prior to the procedure.  We must know immediately if you are taking blood thinners (like Coumadin/Warfarin) or if you are allergic to IV iodine contrast (dye).  We must know if you could possible be pregnant.  Possible side effects:   Bleeding from needle site  Infection (rare, may require surgery)  Nerve injury (rare)  Numbness & tingling (temporary)  A brief convulsion or seizure  Light-headedness (temporary)  Pain at injection site (several days)  Decreased blood pressure (temporary)  Weakness in the leg (temporary)   Call if you experience:   New onset weakness or numbness of an extremity below the injection site that last more than 8 hours.  Hives or difficulty breathing ( go to the emergency room)  Inflammation or drainage at the injection site  Any new symptoms which are concerning  to you  Please note:  Although the local anesthetic injected can often make your back/ hip/ buttock/ leg feel good for several hours after the injections, the pain will likely return.  It takes 3-7 days for steroids to work in the sacroiliac area.  You may not notice any pain relief for at least that one  week.  If effective, we will often do a series of three injections spaced 3-6 weeks apart to maximally decrease your pain.  After the initial series, we generally will wait some months before a repeat injection of the same type.  If you have any questions, please call [phone] [place]  What are the risk, side effects and possible complications? Generally speaking, most procedures are safe.  However, with any procedure there are risks, side effects, and the possibility of complications.  The risks and complications are dependent upon the sites that are lesioned, or the type of nerve block to be performed.  The closer the procedure is to the spine, the more serious the risks are.  Great care is taken when placing the radio frequency needles, block needles or lesioning probes, but sometimes complications can occur. 1. Infection: Any time there is an injection through the skin, there is a risk of infection.  This is why sterile conditions are used for these blocks.  There are four possible types of infection. 1. Localized skin infection. 2. Central Nervous System Infection-This can be in the form of Meningitis, which can be deadly. 3. Epidural Infections-This can be in the form of an epidural abscess, which can cause pressure inside of the spine, causing compression of the spinal cord with subsequent paralysis. This would require an emergency surgery to decompress, and there are no guarantees that the patient would recover from the paralysis. 4. Discitis-This is an infection of the intervertebral discs.  It occurs in about 1% of discography procedures.  It is difficult to treat and it may lead to surgery.        2. Pain: the needles have to go through skin and soft tissues, will cause soreness.       3. Damage to internal structures:  The nerves to be lesioned may be near blood vessels or    other nerves which can be  potentially damaged.       4. Bleeding: Bleeding is more common if the patient is taking blood thinners such as  aspirin, Coumadin, Ticiid, Plavix, etc., or if he/she have some genetic predisposition  such as hemophilia. Bleeding into the spinal canal can cause compression of the spinal  cord with subsequent paralysis.  This would require an emergency surgery to  decompress and there are no guarantees that the patient would recover from the  paralysis.       5. Pneumothorax:  Puncturing of a lung is a possibility, every time a needle is introduced in  the area of the chest or upper back.  Pneumothorax refers to free air around the  collapsed lung(s), inside of the thoracic cavity (chest cavity).  Another two possible  complications related to a similar event would include: Hemothorax and Chylothorax.   These are variations of the Pneumothorax, where instead of air around the collapsed  lung(s), you may have blood or chyle, respectively.       6. Spinal headaches: They may occur with any procedures in the area of the spine.       7. Persistent CSF (  Cerebro-Spinal Fluid) leakage: This is a rare problem, but may occur  with prolonged intrathecal or epidural catheters either due to the formation of a fistulous  track or a dural tear.       8. Nerve damage: By working so close to the spinal cord, there is always a possibility of  nerve damage, which could be as serious as a permanent spinal cord injury with  paralysis.       9. Death:  Although rare, severe deadly allergic reactions known as "Anaphylactic  reaction" can occur to any of the medications used.      10. Worsening of the symptoms:  We can always make thing worse.  What are the chances of something like this happening? Chances of any of this occuring are extremely low.  By statistics, you have more of a chance of getting killed in a motor vehicle accident: while driving to the hospital than any of the above occurring .  Nevertheless, you should be  aware that they are possibilities.  In general, it is similar to taking a shower.  Everybody knows that you can slip, hit your head and get killed.  Does that mean that you should not shower again?  Nevertheless always keep in mind that statistics do not mean anything if you happen to be on the wrong side of them.  Even if a procedure has a 1 (one) in a 1,000,000 (million) chance of going wrong, it you happen to be that one..Also, keep in mind that by statistics, you have more of a chance of having something go wrong when taking medications.  Who should not have this procedure? If you are on a blood thinning medication (e.g. Coumadin, Plavix, see list of "Blood Thinners"), or if you have an active infection going on, you should not have the procedure.  If you are taking any blood thinners, please inform your physician.  How should I prepare for this procedure?  Do not eat or drink anything at least six hours prior to the procedure.  Bring a driver with you .  It cannot be a taxi.  Come accompanied by an adult that can drive you back, and that is strong enough to help you if your legs get weak or numb from the local anesthetic.  Take all of your medicines the morning of the procedure with just enough water to swallow them.  If you have diabetes, make sure that you are scheduled to have your procedure done first thing in the morning, whenever possible.  If you have diabetes, take only half of your insulin dose and notify our nurse that you have done so as soon as you arrive at the clinic.  If you are diabetic, but only take blood sugar pills (oral hypoglycemic), then do not take them on the morning of your procedure.  You may take them after you have had the procedure.  Do not take aspirin or any aspirin-containing medications, at least eleven (11) days prior to the procedure.  They may prolong bleeding.  Wear loose fitting clothing that may be easy to take off and that you would not mind if it  got stained with Betadine or blood.  Do not wear any jewelry or perfume  Remove any nail coloring.  It will interfere with some of our monitoring equipment.  NOTE: Remember that this is not meant to be interpreted as a complete list of all possible complications.  Unforeseen problems may occur.  BLOOD THINNERS The following drugs  contain aspirin or other products, which can cause increased bleeding during surgery and should not be taken for 2 weeks prior to and 1 week after surgery.  If you should need take something for relief of minor pain, you may take acetaminophen which is found in Tylenol,m Datril, Anacin-3 and Panadol. It is not blood thinner. The products listed below are.  Do not take any of the products listed below in addition to any listed on your instruction sheet.  A.P.C or A.P.C with Codeine Codeine Phosphate Capsules #3 Ibuprofen Ridaura  ABC compound Congesprin Imuran rimadil  Advil Cope Indocin Robaxisal  Alka-Seltzer Effervescent Pain Reliever and Antacid Coricidin or Coricidin-D  Indomethacin Rufen  Alka-Seltzer plus Cold Medicine Cosprin Ketoprofen S-A-C Tablets  Anacin Analgesic Tablets or Capsules Coumadin Korlgesic Salflex  Anacin Extra Strength Analgesic tablets or capsules CP-2 Tablets Lanoril Salicylate  Anaprox Cuprimine Capsules Levenox Salocol  Anexsia-D Dalteparin Magan Salsalate  Anodynos Darvon compound Magnesium Salicylate Sine-off  Ansaid Dasin Capsules Magsal Sodium Salicylate  Anturane Depen Capsules Marnal Soma  APF Arthritis pain formula Dewitt's Pills Measurin Stanback  Argesic Dia-Gesic Meclofenamic Sulfinpyrazone  Arthritis Bayer Timed Release Aspirin Diclofenac Meclomen Sulindac  Arthritis pain formula Anacin Dicumarol Medipren Supac  Analgesic (Safety coated) Arthralgen Diffunasal Mefanamic Suprofen  Arthritis Strength Bufferin Dihydrocodeine Mepro Compound Suprol  Arthropan liquid Dopirydamole Methcarbomol with Aspirin Synalgos  ASA  tablets/Enseals Disalcid Micrainin Tagament  Ascriptin Doan's Midol Talwin  Ascriptin A/D Dolene Mobidin Tanderil  Ascriptin Extra Strength Dolobid Moblgesic Ticlid  Ascriptin with Codeine Doloprin or Doloprin with Codeine Momentum Tolectin  Asperbuf Duoprin Mono-gesic Trendar  Aspergum Duradyne Motrin or Motrin IB Triminicin  Aspirin plain, buffered or enteric coated Durasal Myochrisine Trigesic  Aspirin Suppositories Easprin Nalfon Trillsate  Aspirin with Codeine Ecotrin Regular or Extra Strength Naprosyn Uracel  Atromid-S Efficin Naproxen Ursinus  Auranofin Capsules Elmiron Neocylate Vanquish  Axotal Emagrin Norgesic Verin  Azathioprine Empirin or Empirin with Codeine Normiflo Vitamin E  Azolid Emprazil Nuprin Voltaren  Bayer Aspirin plain, buffered or children's or timed BC Tablets or powders Encaprin Orgaran Warfarin Sodium  Buff-a-Comp Enoxaparin Orudis Zorpin  Buff-a-Comp with Codeine Equegesic Os-Cal-Gesic   Buffaprin Excedrin plain, buffered or Extra Strength Oxalid   Bufferin Arthritis Strength Feldene Oxphenbutazone   Bufferin plain or Extra Strength Feldene Capsules Oxycodone with Aspirin   Bufferin with Codeine Fenoprofen Fenoprofen Pabalate or Pabalate-SF   Buffets II Flogesic Panagesic   Buffinol plain or Extra Strength Florinal or Florinal with Codeine Panwarfarin   Buf-Tabs Flurbiprofen Penicillamine   Butalbital Compound Four-way cold tablets Penicillin   Butazolidin Fragmin Pepto-Bismol   Carbenicillin Geminisyn Percodan   Carna Arthritis Reliever Geopen Persantine   Carprofen Gold's salt Persistin   Chloramphenicol Goody's Phenylbutazone   Chloromycetin Haltrain Piroxlcam   Clmetidine heparin Plaquenil   Cllnoril Hyco-pap Ponstel   Clofibrate Hydroxy chloroquine Propoxyphen         Before stopping any of these medications, be sure to consult the physician who ordered them.  Some, such as Coumadin (Warfarin) are ordered to prevent or treat serious conditions  such as "deep thrombosis", "pumonary embolisms", and other heart problems.  The amount of time that you may need off of the medication may also vary with the medication and the reason for which you were taking it.  If you are taking any of these medications, please make sure you notify your pain physician before you undergo any procedures.

## 2015-08-07 ENCOUNTER — Ambulatory Visit: Payer: Medicaid Other | Attending: Otolaryngology

## 2015-08-07 DIAGNOSIS — R0683 Snoring: Secondary | ICD-10-CM | POA: Insufficient documentation

## 2015-08-07 DIAGNOSIS — F5101 Primary insomnia: Secondary | ICD-10-CM | POA: Diagnosis not present

## 2015-08-12 ENCOUNTER — Ambulatory Visit: Payer: Medicaid Other | Admitting: Pain Medicine

## 2015-08-14 ENCOUNTER — Other Ambulatory Visit: Payer: Self-pay | Admitting: Pain Medicine

## 2015-08-19 ENCOUNTER — Telehealth: Payer: Self-pay | Admitting: Pain Medicine

## 2015-08-19 ENCOUNTER — Ambulatory Visit: Payer: Medicaid Other | Admitting: Pain Medicine

## 2015-08-19 NOTE — Telephone Encounter (Signed)
Called patient, she has the flu and called on 08-11-15 to cancel appt, was given appt on 08-25-15 for meds, also scheduled patient for 08-31-15 for block of nerves to SI Patient and daughter both have the flu

## 2015-08-19 NOTE — Telephone Encounter (Signed)
Gina Costa Thank you for rescheduling Gina Costa for medication refill on 08/25/2015                   For rescheduling patient for block of nerves to the sacroiliac joint on 08/31/2015

## 2015-08-25 ENCOUNTER — Encounter: Payer: Self-pay | Admitting: Pain Medicine

## 2015-08-25 ENCOUNTER — Ambulatory Visit: Payer: Medicaid Other | Admitting: Pain Medicine

## 2015-08-25 ENCOUNTER — Ambulatory Visit: Payer: Medicaid Other | Attending: Pain Medicine | Admitting: Pain Medicine

## 2015-08-25 VITALS — BP 106/53 | HR 83 | Temp 98.1°F | Resp 16 | Ht 66.0 in | Wt 170.0 lb

## 2015-08-25 DIAGNOSIS — M545 Low back pain: Secondary | ICD-10-CM | POA: Diagnosis present

## 2015-08-25 DIAGNOSIS — G588 Other specified mononeuropathies: Secondary | ICD-10-CM

## 2015-08-25 DIAGNOSIS — M4806 Spinal stenosis, lumbar region: Secondary | ICD-10-CM | POA: Diagnosis not present

## 2015-08-25 DIAGNOSIS — M47896 Other spondylosis, lumbar region: Secondary | ICD-10-CM | POA: Insufficient documentation

## 2015-08-25 DIAGNOSIS — M79604 Pain in right leg: Secondary | ICD-10-CM | POA: Diagnosis present

## 2015-08-25 DIAGNOSIS — M47816 Spondylosis without myelopathy or radiculopathy, lumbar region: Secondary | ICD-10-CM

## 2015-08-25 DIAGNOSIS — M1288 Other specific arthropathies, not elsewhere classified, other specified site: Secondary | ICD-10-CM | POA: Insufficient documentation

## 2015-08-25 DIAGNOSIS — M5126 Other intervertebral disc displacement, lumbar region: Secondary | ICD-10-CM | POA: Insufficient documentation

## 2015-08-25 DIAGNOSIS — M5116 Intervertebral disc disorders with radiculopathy, lumbar region: Secondary | ICD-10-CM | POA: Insufficient documentation

## 2015-08-25 DIAGNOSIS — G5712 Meralgia paresthetica, left lower limb: Secondary | ICD-10-CM

## 2015-08-25 DIAGNOSIS — M5416 Radiculopathy, lumbar region: Secondary | ICD-10-CM

## 2015-08-25 DIAGNOSIS — M48062 Spinal stenosis, lumbar region with neurogenic claudication: Secondary | ICD-10-CM

## 2015-08-25 DIAGNOSIS — M533 Sacrococcygeal disorders, not elsewhere classified: Secondary | ICD-10-CM

## 2015-08-25 DIAGNOSIS — M5137 Other intervertebral disc degeneration, lumbosacral region: Secondary | ICD-10-CM

## 2015-08-25 DIAGNOSIS — M79605 Pain in left leg: Secondary | ICD-10-CM | POA: Diagnosis present

## 2015-08-25 MED ORDER — OXYCODONE HCL 5 MG PO TABS: ORAL_TABLET | ORAL | Status: DC

## 2015-08-25 NOTE — Patient Instructions (Addendum)
PLAN   Continue present medication oxycodone  F/U PCP Dr. Posey Pronto and Elsworth Soho for evaliation of  BP URI symptoms and general medical condition  Ask receptionist and nurses the date of your appointment to see Dr. Posey Pronto and Elsworth Soho and the date to see your neurologist Dr. Melrose Nakayama  F/U surgical evaluation. Neurosurgical reevaluation as discussed. Please discuss neurosurgical evaluation with Dr. Posey Pronto and Elsworth Soho as we have discussed. Please have them schedule neurosurgical evaluation for you as previously discussed  F/U neurological evaluation. Follow-up as discussed with Dr. Melrose Nakayama .  May consider radiofrequency rhizolysis or intraspinal procedures pending response to present treatment and F/U evaluation Pain Management\  Call pain management prior to scheduled return appointment for any concerns regarding condition prior to scheduled return appointmentPain Management Discharge Instructions  General Discharge Instructions :  If you need to reach your doctor call: Monday-Friday 8:00 am - 4:00 pm at [phone] or toll free [phone].  After clinic hours [phone] to have operator reach doctor.  Bring all of your medication bottles to all your appointments in the pain clinic.  To cancel or reschedule your appointment with Pain Management please remember to call 24 hours in advance to avoid a fee.  Refer to the educational materials which you have been given on: General Risks, I had my Procedure. Discharge Instructions, Post Sedation.  Post Procedure Instructions:  The drugs you were given will stay in your system until tomorrow, so for the next 24 hours you should not drive, make any legal decisions or drink any alcoholic beverages.  You may eat anything you prefer, but it is better to start with liquids then soups and crackers, and gradually work up to solid foods.  Please notify your doctor immediately if you have any unusual bleeding, trouble breathing or pain that is not related to  your normal pain.  Depending on the type of procedure that was done, some parts of your body may feel week and/or numb.  This usually clears up by tonight or the next day.  Walk with the use of an assistive device or accompanied by an adult for the 24 hours.  You may use ice on the affected area for the first 24 hours.  Put ice in a Ziploc bag and cover with a towel and place against area 15 minutes on 15 minutes off.  You may switch to heat after 24 hours.

## 2015-08-25 NOTE — Progress Notes (Signed)
Subjective:    Patient ID: Gina Costa, female    DOB: April 23, 1961, 55 y.o.   MRN: [id number]  HPI   The patient is a 55 year old female who returns to pain management for further evaluation and treatment of pain involving the lower back and lower extremity regions. The patient has had low back pain associated with lower extremity pain and weakness. We have discussed and have attempted to schedule patient for neurosurgical reevaluation. At the present time we'll have patient follow up with Dr. Posey Pronto and Elsworth Soho for evaluation of URI symptoms this week. We will also have patient follow up with Dr. Melrose Nakayama for neurological reevaluation. We will continue medication oxycodone and patient will continue Neurontin prescribed by Dr. Melrose Nakayama. We will consider patient for additional modifications of treatment pending follow-up evaluation. The patient states that she has had URI symptoms for approximately 1 month. The patient has not seen a physician for evaluation. We will schedule patient for evaluations at this time. All agreed to suggested treatment plan   Review of Systems     Objective:   Physical Exam  There was tenderness of the splenius capitis and occipitalis musculature regions of mild to moderate degree with tenderness of the cervical facet cervical paraspinal musculature region. Palpation of the acromioclavicular and glenohumeral joint regions were with tenderness to palpation of moderate degree. The patient was with bilaterally equal grip strength and Tinel and Phalen's maneuver were without increase of pain of significant degree. There was tenderness of the acromioclavicular and glenohumeral joint regions of mild degree with no abnormalities noted with Spurling's maneuver There was tenderness over the region of the lumbar paraspinal musculatures and lumbar facet region a moderate degree with lateral bending rotation extension and palpation of the lumbar facets reproducing moderate discomfort  Lateral bending and rotation was with increased pain on the left as well as on the right.. Palpation over the PSIS and PII S region reproduced moderate discomfort. There was mild tenderness of the greater trochanteric region iliotibial band region. Patient was able to stand on tiptoes and heels with mild difficulty. Straight leg raise was tolerates approximately 30 without increase of pain with dorsiflexion noted. No sensory deficit dermatomal distribution detected. DTRs appeared to be trace at the knees. There was negative clonus negative Homans. Abdomen nontender with no costovertebral tenderness noted.      Assessment & Plan:     Degenerative disc disease lumbar spine Multilevel degenerative changes lumbar spine asymmetric edema of the right facet joint, L5-S1 level most involved, some progression since prior exam, multilevel facet arthropathy L2-3, L3-4, L4-5, and L5-S1. Disc bulging and stenosis noted on lumbar MRI  Lumbar radiculopathy  Lumbar facet syndrome  Lumbar stenosis with neurogenic claudication     PLAN   Continue present medication oxycodone  F/U PCP Dr. Posey Pronto and Elsworth Soho for evaliation of  BP URI symptoms and general medical condition  Ask receptionist and nurses the date of your appointment to see Dr. Posey Pronto and Elsworth Soho and the date to see your neurologist Dr. Melrose Nakayama  F/U surgical evaluation. Neurosurgical reevaluation as discussed. Please discuss neurosurgical evaluation with Dr. Posey Pronto and Elsworth Soho as we have discussed. Please have them schedule neurosurgical evaluation for you as previously discussed  F/U neurological evaluation. Follow-up as discussed with Dr. Melrose Nakayama .  May consider radiofrequency rhizolysis or intraspinal procedures pending response to present treatment and F/U evaluation Pain Management\  Call pain management prior to scheduled return appointment for any concerns regarding condition prior to scheduled return appointment

## 2015-08-25 NOTE — Progress Notes (Signed)
Safety precautions to be maintained throughout the outpatient stay will include: orient to surroundings, keep bed in low position, maintain call bell within reach at all times, provide assistance with transfer out of bed and ambulation.  Pt states she has been sick for about 3 weeks- thinks she has pneum -flu like symptoms- havent gone to MD-- - pt feeling nausea during appt

## 2015-08-31 ENCOUNTER — Ambulatory Visit: Payer: Medicaid Other | Admitting: Pain Medicine

## 2015-08-31 ENCOUNTER — Other Ambulatory Visit: Payer: Self-pay | Admitting: Pain Medicine

## 2015-09-09 ENCOUNTER — Ambulatory Visit: Payer: Medicaid Other | Attending: Pain Medicine | Admitting: Pain Medicine

## 2015-09-09 ENCOUNTER — Encounter: Payer: Self-pay | Admitting: Pain Medicine

## 2015-09-09 ENCOUNTER — Telehealth: Payer: Self-pay | Admitting: *Deleted

## 2015-09-09 VITALS — BP 115/69 | HR 76 | Temp 98.3°F | Resp 15 | Ht 65.0 in | Wt 173.0 lb

## 2015-09-09 DIAGNOSIS — M48062 Spinal stenosis, lumbar region with neurogenic claudication: Secondary | ICD-10-CM

## 2015-09-09 DIAGNOSIS — M4806 Spinal stenosis, lumbar region: Secondary | ICD-10-CM | POA: Insufficient documentation

## 2015-09-09 DIAGNOSIS — G588 Other specified mononeuropathies: Secondary | ICD-10-CM

## 2015-09-09 DIAGNOSIS — M5136 Other intervertebral disc degeneration, lumbar region: Secondary | ICD-10-CM | POA: Diagnosis not present

## 2015-09-09 DIAGNOSIS — G5712 Meralgia paresthetica, left lower limb: Secondary | ICD-10-CM

## 2015-09-09 DIAGNOSIS — M5126 Other intervertebral disc displacement, lumbar region: Secondary | ICD-10-CM | POA: Insufficient documentation

## 2015-09-09 DIAGNOSIS — M1288 Other specific arthropathies, not elsewhere classified, other specified site: Secondary | ICD-10-CM | POA: Insufficient documentation

## 2015-09-09 DIAGNOSIS — M5416 Radiculopathy, lumbar region: Secondary | ICD-10-CM

## 2015-09-09 DIAGNOSIS — M5137 Other intervertebral disc degeneration, lumbosacral region: Secondary | ICD-10-CM

## 2015-09-09 DIAGNOSIS — M545 Low back pain: Secondary | ICD-10-CM | POA: Diagnosis present

## 2015-09-09 DIAGNOSIS — M79606 Pain in leg, unspecified: Secondary | ICD-10-CM | POA: Diagnosis present

## 2015-09-09 DIAGNOSIS — M47816 Spondylosis without myelopathy or radiculopathy, lumbar region: Secondary | ICD-10-CM

## 2015-09-09 DIAGNOSIS — M51379 Other intervertebral disc degeneration, lumbosacral region without mention of lumbar back pain or lower extremity pain: Secondary | ICD-10-CM

## 2015-09-09 DIAGNOSIS — M533 Sacrococcygeal disorders, not elsewhere classified: Secondary | ICD-10-CM

## 2015-09-09 MED ORDER — ORPHENADRINE CITRATE 30 MG/ML IJ SOLN
INTRAMUSCULAR | Status: AC
  Administered 2015-09-09: 11:00:00
  Filled 2015-09-09: qty 2

## 2015-09-09 MED ORDER — TRIAMCINOLONE ACETONIDE 40 MG/ML IJ SUSP: 40.0000 mg | Freq: Once | INTRAMUSCULAR | Status: DC

## 2015-09-09 MED ORDER — FENTANYL CITRATE (PF) 100 MCG/2ML IJ SOLN
INTRAMUSCULAR | Status: AC
  Administered 2015-09-09: 100 ug via INTRAVENOUS
  Filled 2015-09-09: qty 2

## 2015-09-09 MED ORDER — DOXYCYCLINE HYCLATE 100 MG PO TABS: 100.0000 mg | ORAL_TABLET | Freq: Two times a day (BID) | ORAL | Status: DC

## 2015-09-09 MED ORDER — BUPIVACAINE HCL (PF) 0.25 % IJ SOLN
INTRAMUSCULAR | Status: AC
  Administered 2015-09-09: 10:00:00
  Filled 2015-09-09: qty 30

## 2015-09-09 MED ORDER — TRIAMCINOLONE ACETONIDE 40 MG/ML IJ SUSP
INTRAMUSCULAR | Status: AC
  Administered 2015-09-09: 11:00:00
  Filled 2015-09-09: qty 1

## 2015-09-09 MED ORDER — MIDAZOLAM HCL 5 MG/5ML IJ SOLN: 5.0000 mg | Freq: Once | INTRAMUSCULAR | Status: DC

## 2015-09-09 MED ORDER — LACTATED RINGERS IV SOLN: 1000.0000 mL | INTRAVENOUS | Status: DC

## 2015-09-09 MED ORDER — FENTANYL CITRATE (PF) 100 MCG/2ML IJ SOLN: 100.0000 ug | Freq: Once | INTRAMUSCULAR | Status: DC

## 2015-09-09 MED ORDER — ORPHENADRINE CITRATE 30 MG/ML IJ SOLN: 60.0000 mg | Freq: Once | INTRAMUSCULAR | Status: DC

## 2015-09-09 MED ORDER — BUPIVACAINE HCL (PF) 0.25 % IJ SOLN: 30.0000 mL | Freq: Once | INTRAMUSCULAR | Status: DC

## 2015-09-09 MED ORDER — MIDAZOLAM HCL 5 MG/5ML IJ SOLN
INTRAMUSCULAR | Status: AC
  Administered 2015-09-09: 5 mg via INTRAVENOUS
  Filled 2015-09-09: qty 5

## 2015-09-09 NOTE — Telephone Encounter (Signed)
patient called and informed that antibiotic was called [facility] for her to tpick up.

## 2015-09-09 NOTE — Patient Instructions (Addendum)
PLAN   Continue present medication oxycodone. Please get doxycycline antibiotic today and begin taking doxycycline antibiotic today as prescribed  F/U PCP Dr. Posey Pronto and Elsworth Soho for evaliation of  BP and general medical  condition  F/U surgical evaluation. Neurosurgical reevaluation as discussed  F/U neurological evaluation. Follow-up as discussed  May consider radiofrequency rhizolysis or intraspinal procedures pending response to present treatment and F/U evaluation  Patient is to call pain management prior to scheduled return appointment should there be any concerns regarding conditionPain Management Discharge Instructions  General Discharge Instructions :  If you need to reach your doctor call: Monday-Friday 8:00 am - 4:00 pm at [phone] or toll free [phone].  After clinic hours [phone] to have operator reach doctor.  Bring all of your medication bottles to all your appointments in the pain clinic.  To cancel or reschedule your appointment with Pain Management please remember to call 24 hours in advance to avoid a fee.  Refer to the educational materials which you have been given on: General Risks, I had my Procedure. Discharge Instructions, Post Sedation.  Post Procedure Instructions:  The drugs you were given will stay in your system until tomorrow, so for the next 24 hours you should not drive, make any legal decisions or drink any alcoholic beverages.  You may eat anything you prefer, but it is better to start with liquids then soups and crackers, and gradually work up to solid foods.  Please notify your doctor immediately if you have any unusual bleeding, trouble breathing or pain that is not related to your normal pain.  Depending on the type of procedure that was done, some parts of your body may feel week and/or numb.  This usually clears up by tonight or the next day.  Walk with the use of an assistive device or accompanied by an adult for the 24  hours.  You may use ice on the affected area for the first 24 hours.  Put ice in a Ziploc bag and cover with a towel and place against area 15 minutes on 15 minutes off.  You may switch to heat after 24 hours.

## 2015-09-09 NOTE — Progress Notes (Signed)
Subjective:    Patient ID: Gina Costa, female    DOB: 03/06/1961, 55 y.o.   MRN: [id number]  HPI  PROCEDURE:  Block of nerves to the sacroiliac joint.   NOTE:  The patient is a 55 y.o. female who returns to the [facility] for further evaluation and treatment of pain involving the lower back and lower extremity region with pain in the region of the buttocks as well. Prior MRI studies reveal degenerative disc disease lumbar spine with  multilevel degenerative changes lumbar spine asymmetric edema of the right facet joint, L5-S1 level most involved, some progression since prior exam, multilevel facet arthropathy L2-3, L3-4, L4-5, and L5-S1. Disc bulging and stenosis noted on lumbar MRI.  Marland Kitchen The patient is with reproduction of severe pain with palpation over the PSIS and PII S regions and is with positive Patrick's maneuver There is concern regarding a significant component of the patient's pain being due to sacroiliac joint dysfunction The risks, benefits, expectations of the procedure have been discussed and explained to the patient who is understanding and willing to proceed with interventional treatment in attempt to decrease severity of patient's symptoms, minimize the risk of medication escalation and  hopefully retard the progression of the patient's symptoms. We will proceed with what is felt to be a medically necessary procedure, block of nerves to the sacroiliac joint.   DESCRIPTION OF PROCEDURE:  Block of nerves to the sacroiliac joint.   The patient was taken to the fluoroscopy suite. With the patient in the prone position with EKG, blood pressure, pulse, capnography, and pulse oximetry monitoring, IV Versed, IV fentanyl conscious sedation, Betadine prep of proposed entry site was performed.   Block of nerves at the L5 vertebral body level.   With the patient in prone position, under fluoroscopic guidance, a 22 -gauge needle was inserted at the L5 vertebral body level on  the left side. With 15 degrees oblique orientation a 22 -gauge needle was inserted in the region known as Burton's eye or eye of the Scotty dog. Following documentation of needle placement in the area of Burton's eye or eye of the Scotty dog under fluoroscopic guidance, needle placement was then accomplished at the sacral ala level on the left side.   Needle placement at the sacral ala.   With the patient in prone position under fluoroscopic guidance with AP view of the lumbosacral spine, a 22 -gauge needle was inserted in the region known as the sacral ala on the left side. Following documentation of needle placement on the left side under fluoroscopic guidance needle placement was then accomplished at the S1 foramen level.   Needle placement at the S1 foramen level.   With the patient in prone position under fluoroscopic guidance with AP view of the lumbosacral spine and cephalad orientation, a 22 -gauge needle was inserted at the superior and lateral border of the S1 foramen on the left side. Following documentation of needle placement at the S1 foramen level on the left side, needle placement was then accomplished at the S2 foramen level on the left side.   Needle placement at the S2 foramen level.   With the patient in prone position with AP view of the lumbosacral spine with cephalad orientation, a 22 - gauge needle was inserted at the superior and lateral border of the S2 foramen under fluoroscopic guidance on the left side. Following needle placement at the L5 vertebral body level, sacral ala, S1 foramen and S2 foramen on the  left side, needle placement was verified on lateral view under fluoroscopic guidance.  Following needle placement documentation on lateral view, each needle was injected with 1 mL of 0.25% bupivacaine and Kenalog.   BLOCK OF THE NERVES TO SACROILIAC JOINT ON THE RIGHT SIDE The procedure was performed on the right side at the same levels as was performed on the left side  and utilizing the same technique as on the left side and was performed under fluoroscopic guidance as on the left side   The patient tolerated procedure well  A total of 10mg  of Kenalog was utilized for the procedure.   PLAN:  1. Medications: The patient will continue presently prescribed medication oxycodone  2. The patient will be considered for modification of treatment regimen pending response to the procedure performed on today's visit.  3. The patient is to follow-up with primary care physician Dr. Posey Pronto and Elsworth Soho for evaluation of blood pressure and general medical condition following the procedure performed on today's visit.  4. Surgical evaluation as discussed. Patient will follow-up with Dr. Trenton Gammon or of the neurosurgeon as discussed  5. Neurological evaluation as discussed.  6. The patient may be a candidate for radiofrequency procedures, implantation devices and other treatment pending response to treatment performed on today's visit and follow-up evaluation.  7. The patient has been advised to adhere to proper body mechanics and to avoid activities which may exacerbate the patient's symptoms.   [facility] as scheduled.    Review of Systems     Objective:   Physical Exam        Assessment & Plan:

## 2015-09-09 NOTE — Progress Notes (Signed)
Safety precautions to be maintained throughout the outpatient stay will include: orient to surroundings, keep bed in low position, maintain call bell within reach at all times, provide assistance with transfer out of bed and ambulation.  

## 2015-09-10 ENCOUNTER — Telehealth: Payer: Self-pay | Admitting: *Deleted

## 2015-09-10 NOTE — Telephone Encounter (Signed)
Spoke with patient, verbalizes no complications from procedure on yesterday. 

## 2015-09-24 ENCOUNTER — Encounter: Payer: Self-pay | Admitting: Pain Medicine

## 2015-09-24 ENCOUNTER — Ambulatory Visit: Payer: Medicaid Other | Attending: Pain Medicine | Admitting: Pain Medicine

## 2015-09-24 VITALS — BP 103/64 | HR 72 | Temp 98.1°F | Resp 16 | Ht 64.0 in | Wt 173.0 lb

## 2015-09-24 DIAGNOSIS — G5712 Meralgia paresthetica, left lower limb: Secondary | ICD-10-CM

## 2015-09-24 DIAGNOSIS — M4806 Spinal stenosis, lumbar region: Secondary | ICD-10-CM | POA: Insufficient documentation

## 2015-09-24 DIAGNOSIS — M5126 Other intervertebral disc displacement, lumbar region: Secondary | ICD-10-CM | POA: Insufficient documentation

## 2015-09-24 DIAGNOSIS — M5116 Intervertebral disc disorders with radiculopathy, lumbar region: Secondary | ICD-10-CM | POA: Insufficient documentation

## 2015-09-24 DIAGNOSIS — M47816 Spondylosis without myelopathy or radiculopathy, lumbar region: Secondary | ICD-10-CM | POA: Insufficient documentation

## 2015-09-24 DIAGNOSIS — M533 Sacrococcygeal disorders, not elsewhere classified: Secondary | ICD-10-CM

## 2015-09-24 DIAGNOSIS — M48062 Spinal stenosis, lumbar region with neurogenic claudication: Secondary | ICD-10-CM

## 2015-09-24 DIAGNOSIS — G588 Other specified mononeuropathies: Secondary | ICD-10-CM

## 2015-09-24 DIAGNOSIS — M5137 Other intervertebral disc degeneration, lumbosacral region: Secondary | ICD-10-CM

## 2015-09-24 DIAGNOSIS — M545 Low back pain: Secondary | ICD-10-CM | POA: Diagnosis present

## 2015-09-24 DIAGNOSIS — M5416 Radiculopathy, lumbar region: Secondary | ICD-10-CM

## 2015-09-24 MED ORDER — OXYCODONE HCL 5 MG PO TABS: ORAL_TABLET | ORAL | Status: DC

## 2015-09-24 NOTE — Patient Instructions (Addendum)
PLAN   Continue present medication oxycodone  Lumbar epidural steroid injection to be performed at time of return appointment  F/U PCP Dr. Posey Pronto and Elsworth Soho for evaliation of  BP and general medical condition  Ask receptionist and nurses the date of your appointment to see Dr. Posey Pronto and Elsworth Soho and the date to see your neurologist Dr. Melrose Nakayama  F/U surgical evaluation. Neurosurgical reevaluation as discussed. Please discuss neurosurgical evaluation with Dr. Posey Pronto and Elsworth Soho as we have previously discussed. Please have them schedule neurosurgical evaluation for you as previously discussed since we have been unable to schedule the appointment for you  F/U neurological evaluation. Follow-up as discussed with Dr. Melrose Nakayama .  May consider radiofrequency rhizolysis or intraspinal procedures pending response to present treatment and F/U evaluation Pain Management\  Call pain management prior to scheduled return appointment for any concerns regarding condition prior to scheduled return appointmentGENERAL RISKS AND COMPLICATIONS  What are the risk, side effects and possible complications? Generally speaking, most procedures are safe.  However, with any procedure there are risks, side effects, and the possibility of complications.  The risks and complications are dependent upon the sites that are lesioned, or the type of nerve block to be performed.  The closer the procedure is to the spine, the more serious the risks are.  Great care is taken when placing the radio frequency needles, block needles or lesioning probes, but sometimes complications can occur. 1. Infection: Any time there is an injection through the skin, there is a risk of infection.  This is why sterile conditions are used for these blocks.  There are four possible types of infection. 1. Localized skin infection. 2. Central Nervous System Infection-This can be in the form of Meningitis, which can be deadly. 3. Epidural Infections-This can be in  the form of an epidural abscess, which can cause pressure inside of the spine, causing compression of the spinal cord with subsequent paralysis. This would require an emergency surgery to decompress, and there are no guarantees that the patient would recover from the paralysis. 4. Discitis-This is an infection of the intervertebral discs.  It occurs in about 1% of discography procedures.  It is difficult to treat and it may lead to surgery.        2. Pain: the needles have to go through skin and soft tissues, will cause soreness.       3. Damage to internal structures:  The nerves to be lesioned may be near blood vessels or    other nerves which can be potentially damaged.       4. Bleeding: Bleeding is more common if the patient is taking blood thinners such as  aspirin, Coumadin, Ticiid, Plavix, etc., or if he/she have some genetic predisposition  such as hemophilia. Bleeding into the spinal canal can cause compression of the spinal  cord with subsequent paralysis.  This would require an emergency surgery to  decompress and there are no guarantees that the patient would recover from the  paralysis.       5. Pneumothorax:  Puncturing of a lung is a possibility, every time a needle is introduced in  the area of the chest or upper back.  Pneumothorax refers to free air around the  collapsed lung(s), inside of the thoracic cavity (chest cavity).  Another two possible  complications related to a similar event would include: Hemothorax and Chylothorax.   These are variations of the Pneumothorax, where instead of air around the collapsed  lung(s), you may  have blood or chyle, respectively.       6. Spinal headaches: They may occur with any procedures in the area of the spine.       7. Persistent CSF (Cerebro-Spinal Fluid) leakage: This is a rare problem, but may occur  with prolonged intrathecal or epidural catheters either due to the formation of a fistulous  track or a dural tear.       8. Nerve damage: By  working so close to the spinal cord, there is always a possibility of  nerve damage, which could be as serious as a permanent spinal cord injury with  paralysis.       9. Death:  Although rare, severe deadly allergic reactions known as "Anaphylactic  reaction" can occur to any of the medications used.      10. Worsening of the symptoms:  We can always make thing worse.  What are the chances of something like this happening? Chances of any of this occuring are extremely low.  By statistics, you have more of a chance of getting killed in a motor vehicle accident: while driving to the hospital than any of the above occurring .  Nevertheless, you should be aware that they are possibilities.  In general, it is similar to taking a shower.  Everybody knows that you can slip, hit your head and get killed.  Does that mean that you should not shower again?  Nevertheless always keep in mind that statistics do not mean anything if you happen to be on the wrong side of them.  Even if a procedure has a 1 (one) in a 1,000,000 (million) chance of going wrong, it you happen to be that one..Also, keep in mind that by statistics, you have more of a chance of having something go wrong when taking medications.  Who should not have this procedure? If you are on a blood thinning medication (e.g. Coumadin, Plavix, see list of "Blood Thinners"), or if you have an active infection going on, you should not have the procedure.  If you are taking any blood thinners, please inform your physician.  How should I prepare for this procedure?  Do not eat or drink anything at least six hours prior to the procedure.  Bring a driver with you .  It cannot be a taxi.  Come accompanied by an adult that can drive you back, and that is strong enough to help you if your legs get weak or numb from the local anesthetic.  Take all of your medicines the morning of the procedure with just enough water to swallow them.  If you have diabetes,  make sure that you are scheduled to have your procedure done first thing in the morning, whenever possible.  If you have diabetes, take only half of your insulin dose and notify our nurse that you have done so as soon as you arrive at the clinic.  If you are diabetic, but only take blood sugar pills (oral hypoglycemic), then do not take them on the morning of your procedure.  You may take them after you have had the procedure.  Do not take aspirin or any aspirin-containing medications, at least eleven (11) days prior to the procedure.  They may prolong bleeding.  Wear loose fitting clothing that may be easy to take off and that you would not mind if it got stained with Betadine or blood.  Do not wear any jewelry or perfume  Remove any nail coloring.  It will interfere with  some of our monitoring equipment.  NOTE: Remember that this is not meant to be interpreted as a complete list of all possible complications.  Unforeseen problems may occur.  BLOOD THINNERS The following drugs contain aspirin or other products, which can cause increased bleeding during surgery and should not be taken for 2 weeks prior to and 1 week after surgery.  If you should need take something for relief of minor pain, you may take acetaminophen which is found in Tylenol,m Datril, Anacin-3 and Panadol. It is not blood thinner. The products listed below are.  Do not take any of the products listed below in addition to any listed on your instruction sheet.  A.P.C or A.P.C with Codeine Codeine Phosphate Capsules #3 Ibuprofen Ridaura  ABC compound Congesprin Imuran rimadil  Advil Cope Indocin Robaxisal  Alka-Seltzer Effervescent Pain Reliever and Antacid Coricidin or Coricidin-D  Indomethacin Rufen  Alka-Seltzer plus Cold Medicine Cosprin Ketoprofen S-A-C Tablets  Anacin Analgesic Tablets or Capsules Coumadin Korlgesic Salflex  Anacin Extra Strength Analgesic tablets or capsules CP-2 Tablets Lanoril Salicylate  Anaprox  Cuprimine Capsules Levenox Salocol  Anexsia-D Dalteparin Magan Salsalate  Anodynos Darvon compound Magnesium Salicylate Sine-off  Ansaid Dasin Capsules Magsal Sodium Salicylate  Anturane Depen Capsules Marnal Soma  APF Arthritis pain formula Dewitt's Pills Measurin Stanback  Argesic Dia-Gesic Meclofenamic Sulfinpyrazone  Arthritis Bayer Timed Release Aspirin Diclofenac Meclomen Sulindac  Arthritis pain formula Anacin Dicumarol Medipren Supac  Analgesic (Safety coated) Arthralgen Diffunasal Mefanamic Suprofen  Arthritis Strength Bufferin Dihydrocodeine Mepro Compound Suprol  Arthropan liquid Dopirydamole Methcarbomol with Aspirin Synalgos  ASA tablets/Enseals Disalcid Micrainin Tagament  Ascriptin Doan's Midol Talwin  Ascriptin A/D Dolene Mobidin Tanderil  Ascriptin Extra Strength Dolobid Moblgesic Ticlid  Ascriptin with Codeine Doloprin or Doloprin with Codeine Momentum Tolectin  Asperbuf Duoprin Mono-gesic Trendar  Aspergum Duradyne Motrin or Motrin IB Triminicin  Aspirin plain, buffered or enteric coated Durasal Myochrisine Trigesic  Aspirin Suppositories Easprin Nalfon Trillsate  Aspirin with Codeine Ecotrin Regular or Extra Strength Naprosyn Uracel  Atromid-S Efficin Naproxen Ursinus  Auranofin Capsules Elmiron Neocylate Vanquish  Axotal Emagrin Norgesic Verin  Azathioprine Empirin or Empirin with Codeine Normiflo Vitamin E  Azolid Emprazil Nuprin Voltaren  Bayer Aspirin plain, buffered or children's or timed BC Tablets or powders Encaprin Orgaran Warfarin Sodium  Buff-a-Comp Enoxaparin Orudis Zorpin  Buff-a-Comp with Codeine Equegesic Os-Cal-Gesic   Buffaprin Excedrin plain, buffered or Extra Strength Oxalid   Bufferin Arthritis Strength Feldene Oxphenbutazone   Bufferin plain or Extra Strength Feldene Capsules Oxycodone with Aspirin   Bufferin with Codeine Fenoprofen Fenoprofen Pabalate or Pabalate-SF   Buffets II Flogesic Panagesic   Buffinol plain or Extra Strength Florinal  or Florinal with Codeine Panwarfarin   Buf-Tabs Flurbiprofen Penicillamine   Butalbital Compound Four-way cold tablets Penicillin   Butazolidin Fragmin Pepto-Bismol   Carbenicillin Geminisyn Percodan   Carna Arthritis Reliever Geopen Persantine   Carprofen Gold's salt Persistin   Chloramphenicol Goody's Phenylbutazone   Chloromycetin Haltrain Piroxlcam   Clmetidine heparin Plaquenil   Cllnoril Hyco-pap Ponstel   Clofibrate Hydroxy chloroquine Propoxyphen         Before stopping any of these medications, be sure to consult the physician who ordered them.  Some, such as Coumadin (Warfarin) are ordered to prevent or treat serious conditions such as "deep thrombosis", "pumonary embolisms", and other heart problems.  The amount of time that you may need off of the medication may also vary with the medication and the reason for which you were taking it.  If you are taking any of these medications, please make sure you notify your pain physician before you undergo any procedures.         Epidural Steroid Injection An epidural steroid injection is given to relieve pain in your neck, back, or legs that is caused by the irritation or swelling of a nerve root. This procedure involves injecting a steroid and numbing medicine (anesthetic) into the epidural space. The epidural space is the space between the outer covering of your spinal cord and the bones that form your backbone (vertebra).  LET [facility] CARE PROVIDER KNOW ABOUT:  2. Any allergies you have. 3. All medicines you are taking, including vitamins, herbs, eye drops, creams, and over-the-counter medicines such as aspirin. 4. Previous problems you or members of your family have had with the use of anesthetics. 5. Any blood disorders or blood clotting disorders you have. 6. Previous surgeries you have had. 7. Medical conditions you have. RISKS AND COMPLICATIONS Generally, this is a safe procedure. However, as with any procedure,  complications can occur. Possible complications of epidural steroid injection include:  Headache.  Bleeding.  Infection.  Allergic reaction to the medicines.  Damage to your nerves. The response to this procedure depends on the underlying cause of the pain and its duration. People who have long-term (chronic) pain are less likely to benefit from epidural steroids than are those people whose pain comes on strong and suddenly. BEFORE THE PROCEDURE   Ask your health care provider about changing or stopping your regular medicines. You may be advised to stop taking blood-thinning medicines a few days before the procedure.  You may be given medicines to reduce anxiety.  Arrange for someone to take you home after the procedure. PROCEDURE   You will remain awake during the procedure. You may receive medicine to make you relaxed.  You will be asked to lie on your stomach.  The injection site will be cleaned.  The injection site will be numbed with a medicine (local anesthetic).  A needle will be injected through your skin into the epidural space.  Your health care provider will use an X-ray machine to ensure that the steroid is delivered closest to the affected nerve. You may have minimal discomfort at this time.  Once the needle is in the right position, the local anesthetic and the steroid will be injected into the epidural space.  The needle will then be removed and a bandage will be applied to the injection site. AFTER THE PROCEDURE  12. You may be monitored for a short time before you go home. 13. You may feel weakness or numbness in your arm or leg, which disappears within hours. 14. You may be allowed to eat, drink, and take your regular medicine. 15. You may have soreness at the site of the injection.   This information is not intended to replace advice given to you by your health care provider. Make sure you discuss any questions you have with your health care provider.    Document Released: 08/16/2007 Document Revised: 01/09/2013 Document Reviewed: 10/26/2012 Elsevier Interactive Patient Education [employer].

## 2015-09-24 NOTE — Progress Notes (Signed)
Safety precautions to be maintained throughout the outpatient stay will include: orient to surroundings, keep bed in low position, maintain call bell within reach at all times, provide assistance with transfer out of bed and ambulation.  

## 2015-09-25 NOTE — Progress Notes (Signed)
Subjective:    Patient ID: Gina Costa, female    DOB: [birth year], 55 y.o.   MRN: [id number]  HPI  The patient is a 55 year old female who returns to pain management for further evaluation and treatment of pain involving the lower back lower extremity region. The patient has significant lumbar and lower extremity pain paresthesias associated with significant weakness as well. We have attempted to have patient undergo neurosurgical evaluation and continue to request neurosurgical evaluation of patient. We will also discussed additional studies included updated studies of the spine. We have requested that patient's primary care physician Dr. Posey Pronto and Elsworth Soho also assist [place] in this regard. We will continue patient's present medications and will consider patient for modifications of treatment regimen pending response to the present plan and follow-up evaluation. At the present time we will schedule patient for lumbar epidural steroid injection to be performed at time return appointment in attempt to decrease severity of symptoms, minimize progression of symptoms, and avoid the need for more involved treatment. All agreed to suggested treatment plan.    Review of Systems     Objective:   Physical Exam  There was tenderness to palpation of paraspinal misreading cervical region cervical facet region palpation which reproduces pain of mild degree with mild tenderness of the splenius capitis and occipitalis musculature regions. Palpation of the acromioclavicular and glenohumeral joint regions reproduce mild discomfort and patient appeared to be with bilaterally equal grip strength with Tinel and Phalen's maneuver causing minimal discomfort as well. There was tenderness to palpation of the thoracic paraspinal musculature and thoracic facet region with no crepitus of the thoracic region noted. Palpation over the region of the lumbar paraspinal musculatures and lumbar facet region was attends to palpation  with moderately severe tenderness to palpation over the lumbar facet lumbar paraspinal musculature region exacerbated by lateral bending rotation extension and palpation over the lumbar facet regions. There was tenderness to palpation of the PSIS and PII S region as well as the gluteal and piriformis musculature. There was mild tenderness of the greater trochanteric region iliotibial band region. Straight leg raising was tolerates approximately 20 without increased pain with dorsiflexion noted DTRs were difficult to elicit appeared to be trace at the knees. There was questionably decreased sensation of the lower extremities L5 dermatomal distribution and EHL strength appeared to be decreased. regions there was negative clonus negative Homans. Abdomen was nontender and no costovertebral tenderness was noted      Assessment & Plan:    Degenerative disc disease lumbar spine Multilevel degenerative changes lumbar spine asymmetric edema of the right facet joint, L5-S1 level most involved, some progression since prior exam, multilevel facet arthropathy L2-3, L3-4, L4-5, and L5-S1. Disc bulging and stenosis noted on lumbar MRI  Lumbar radiculopathy  Lumbar facet syndrome  Lumbar stenosis with neurogenic claudication     PLAN   Continue present medication oxycodone  Lumbar epidural steroid injection to be performed at time of return appointment  F/U PCP Dr. Posey Pronto and Elsworth Soho for evaliation of  BP and general medical condition  Ask receptionist and nurses the date of your appointment to see Dr. Posey Pronto and Elsworth Soho and the date to see your neurologist Dr. Melrose Nakayama  F/U surgical evaluation. Neurosurgical reevaluation as discussed. Please discuss neurosurgical evaluation with Dr. Posey Pronto and Elsworth Soho as we have previously discussed. Please have them schedule neurosurgical evaluation for you as previously discussed since we have been unable to schedule the appointment for you  F/U neurological evaluation.  Follow-up  as discussed with Dr. Melrose Nakayama .  May consider radiofrequency rhizolysis or intraspinal procedures pending response to present treatment and F/U evaluation Pain Management\  Call pain management prior to scheduled return appointment for any concerns regarding condition prior to scheduled return appointment

## 2015-10-07 ENCOUNTER — Ambulatory Visit: Payer: Medicaid Other | Attending: Pain Medicine | Admitting: Pain Medicine

## 2015-10-07 ENCOUNTER — Encounter: Payer: Self-pay | Admitting: Pain Medicine

## 2015-10-07 VITALS — BP 100/60 | HR 72 | Temp 98.4°F | Resp 16 | Ht 65.0 in | Wt 173.0 lb

## 2015-10-07 DIAGNOSIS — M5136 Other intervertebral disc degeneration, lumbar region: Secondary | ICD-10-CM | POA: Diagnosis not present

## 2015-10-07 DIAGNOSIS — M545 Low back pain: Secondary | ICD-10-CM | POA: Insufficient documentation

## 2015-10-07 DIAGNOSIS — M5416 Radiculopathy, lumbar region: Secondary | ICD-10-CM

## 2015-10-07 DIAGNOSIS — M4806 Spinal stenosis, lumbar region: Secondary | ICD-10-CM | POA: Insufficient documentation

## 2015-10-07 DIAGNOSIS — M533 Sacrococcygeal disorders, not elsewhere classified: Secondary | ICD-10-CM

## 2015-10-07 DIAGNOSIS — M48062 Spinal stenosis, lumbar region with neurogenic claudication: Secondary | ICD-10-CM

## 2015-10-07 DIAGNOSIS — M47816 Spondylosis without myelopathy or radiculopathy, lumbar region: Secondary | ICD-10-CM | POA: Insufficient documentation

## 2015-10-07 DIAGNOSIS — G588 Other specified mononeuropathies: Secondary | ICD-10-CM

## 2015-10-07 DIAGNOSIS — M5137 Other intervertebral disc degeneration, lumbosacral region: Secondary | ICD-10-CM

## 2015-10-07 DIAGNOSIS — M1288 Other specific arthropathies, not elsewhere classified, other specified site: Secondary | ICD-10-CM | POA: Diagnosis not present

## 2015-10-07 DIAGNOSIS — M79606 Pain in leg, unspecified: Secondary | ICD-10-CM | POA: Diagnosis present

## 2015-10-07 DIAGNOSIS — G5712 Meralgia paresthetica, left lower limb: Secondary | ICD-10-CM

## 2015-10-07 MED ORDER — LIDOCAINE HCL (PF) 1 % IJ SOLN
10.0000 mL | Freq: Once | INTRAMUSCULAR | Status: AC
  Administered 2015-10-07: 10 mL via SUBCUTANEOUS
  Filled 2015-10-07: qty 10

## 2015-10-07 MED ORDER — SODIUM CHLORIDE 0.9% FLUSH
20.0000 mL | Freq: Once | INTRAVENOUS | Status: AC
  Administered 2015-10-07: 20 mL

## 2015-10-07 MED ORDER — DOXYCYCLINE HYCLATE 100 MG PO TABS: 100.0000 mg | ORAL_TABLET | Freq: Two times a day (BID) | ORAL | Status: DC

## 2015-10-07 MED ORDER — SODIUM CHLORIDE 0.9 % IJ SOLN
INTRAMUSCULAR | Status: AC
  Administered 2015-10-07: 15:00:00
  Filled 2015-10-07: qty 20

## 2015-10-07 MED ORDER — LACTATED RINGERS IV SOLN: 1000.0000 mL | INTRAVENOUS | Status: DC

## 2015-10-07 MED ORDER — TRIAMCINOLONE ACETONIDE 40 MG/ML IJ SUSP
40.0000 mg | Freq: Once | INTRAMUSCULAR | Status: AC
  Administered 2015-10-07: 40 mg
  Filled 2015-10-07: qty 1

## 2015-10-07 NOTE — Progress Notes (Signed)
   Subjective:    Patient ID: Gina Costa, female    DOB: Jan 23, 1961, 55 y.o.   MRN: [id number]  HPI  PROCEDURE PERFORMED: Lumbar epidural steroid injection   NOTE: The patient is a 55 y.o. female who returns to [place] for further evaluation and treatment of pain involving the lumbar and lower extremity region. MRI revealed the patient to be with  degenerative disc disease lumbar spine with multilevel degenerative changes lumbar spine asymmetric edema of the right facet joint, L5-S1 level most involved, some progression since prior exam, multilevel facet arthropathy L2-3, L3-4, L4-5, and L5-S1. Disc bulging and stenosis noted on lumbar MRI.  there is concern regarding intraspinal amount continue to patient's symptomatology with concern regarding lumbar stenosis with neurogenic claudication and lumbar facet syndrome contributing to patient's symptomatology with significant degree The risks, benefits, and expectations of the procedure have been discussed and explained to the patient who was understanding and in agreement with suggested treatment plan. We will proceed with lumbar epidural steroid injection as discussed and as explained to the patient who is willing to proceed with procedure as planned.   DESCRIPTION OF PROCEDURE: Lumbar epidural steroid injection with EKG, blood pressure, pulse, capnography, and pulse oximetry monitoring. The procedure was performed with the patient in the prone position under fluoroscopic guidance. A local anesthetic skin wheal of 1.5% plain lidocaine was accomplished at proposed entry site. An 18-gauge Tuohy epidural needle was inserted at the L4 vertebral body level left  of the midline via loss-of-resistance technique with negative heme and negative CSF return. A total of 4 mL of Preservative-Free normal saline with 40 mg of Kenalog injected incrementally via epidurally placed needle. Needle was removed.    A total of 40 mg of Kenalog was utilized  for the procedure.   The patient tolerated the injection well.    PLAN:   1. Medications: We will continue presently prescribed medications Neurontin and oxycodone  2. Will consider modification of treatment regimen pending response to treatment rendered on today's visit and follow-up evaluation. 3. The patient is to follow-up with primary care physician Dr. Posey Pronto and Elsworth Soho  regarding blood pressure and general medical condition status post lumbar epidural steroid injection performed on today's visit. 4. Surgical evaluation.Neurosurgical reevaluation has been requested. The patient will address neurosurgical reevaluation with Dr. Posey Pronto and Dr. Elsworth Soho and will request Dr. Posey Pronto and Dr. Elsworth Soho to schedule patient for neurosurgical reevaluation of lumbar and lower extremity pain and paresthesias at this time area the patient will follow-up with Dr. Trenton Gammon for neurosurgical reevaluation as discussed and patient is to undergo further neurosurgical evaluation at [facility]  5. Neurological evaluation.. The patient will follow-up with Dr. Melrose Nakayama in this regard  6. The patient may be a candidate for radiofrequency procedures, implantation device, and other treatment pending response to treatment and follow-up evaluation. 7. The patient has been advised to adhere to proper body mechanics and avoid activities which appear to aggravate condition. 8. The patient has been advised to call the [facility] prior to scheduled return appointment should there be significant change in condition or should there be sign  The patient is understanding and agrees with the suggested  treatment plan   Review of Systems     Objective:   Physical Exam        Assessment & Plan:

## 2015-10-07 NOTE — Patient Instructions (Addendum)
PLAN   Continue present medication oxycodone. Please get doxycycline antibiotic today and begin taking doxycycline antibiotic today as prescribed  F/U PCP Dr. Posey Pronto and Elsworth Soho for evaliation of  BP and general medical  condition. Please discussed neurosurgical evaluation with Dr. Posey Pronto and Elsworth Soho and ask them once again to schedule neurosurgical evaluation for you  F/U surgical evaluation. Neurosurgical reevaluation as discussed  F/U neurological evaluation. Follow-up appointment with Dr. Melrose Nakayama as planned  May consider radiofrequency rhizolysis or intraspinal procedures pending response to present treatment and F/U evaluation  Patient is to call pain management prior to scheduled return appointment should there be any concerns regarding conditionPain Management Discharge Instructions  General Discharge Instructions :  If you need to reach your doctor call: Monday-Friday 8:00 am - 4:00 pm at [phone] or toll free [phone].  After clinic hours [phone] to have operator reach doctor.  Bring all of your medication bottles to all your appointments in the pain clinic.  To cancel or reschedule your appointment with Pain Management please remember to call 24 hours in advance to avoid a fee.  Refer to the educational materials which you have been given on: General Risks, I had my Procedure. Discharge Instructions, Post Sedation.  Post Procedure Instructions:  The drugs you were given will stay in your system until tomorrow, so for the next 24 hours you should not drive, make any legal decisions or drink any alcoholic beverages.  You may eat anything you prefer, but it is better to start with liquids then soups and crackers, and gradually work up to solid foods.  Please notify your doctor immediately if you have any unusual bleeding, trouble breathing or pain that is not related to your normal pain.  Depending on the type of procedure that was done, some parts of your body may feel  week and/or numb.  This usually clears up by tonight or the next day.  Walk with the use of an assistive device or accompanied by an adult for the 24 hours.  You may use ice on the affected area for the first 24 hours.  Put ice in a Ziploc bag and cover with a towel and place against area 15 minutes on 15 minutes off.  You may switch to heat after 24 hours.

## 2015-10-07 NOTE — Progress Notes (Signed)
Safety precautions to be maintained throughout the outpatient stay will include: orient to surroundings, keep bed in low position, maintain call bell within reach at all times, provide assistance with transfer out of bed and ambulation.  

## 2015-11-12 ENCOUNTER — Ambulatory Visit: Payer: Medicaid Other | Attending: Pain Medicine | Admitting: Pain Medicine

## 2015-11-12 ENCOUNTER — Encounter: Payer: Self-pay | Admitting: Pain Medicine

## 2015-11-12 VITALS — BP 110/60 | HR 76 | Resp 16 | Ht 66.0 in | Wt 168.0 lb

## 2015-11-12 DIAGNOSIS — M4806 Spinal stenosis, lumbar region: Secondary | ICD-10-CM | POA: Insufficient documentation

## 2015-11-12 DIAGNOSIS — M5126 Other intervertebral disc displacement, lumbar region: Secondary | ICD-10-CM | POA: Insufficient documentation

## 2015-11-12 DIAGNOSIS — M5137 Other intervertebral disc degeneration, lumbosacral region: Secondary | ICD-10-CM

## 2015-11-12 DIAGNOSIS — G5712 Meralgia paresthetica, left lower limb: Secondary | ICD-10-CM

## 2015-11-12 DIAGNOSIS — M545 Low back pain: Secondary | ICD-10-CM | POA: Diagnosis present

## 2015-11-12 DIAGNOSIS — M5116 Intervertebral disc disorders with radiculopathy, lumbar region: Secondary | ICD-10-CM | POA: Insufficient documentation

## 2015-11-12 DIAGNOSIS — M5416 Radiculopathy, lumbar region: Secondary | ICD-10-CM

## 2015-11-12 DIAGNOSIS — M533 Sacrococcygeal disorders, not elsewhere classified: Secondary | ICD-10-CM

## 2015-11-12 DIAGNOSIS — M79606 Pain in leg, unspecified: Secondary | ICD-10-CM | POA: Diagnosis present

## 2015-11-12 DIAGNOSIS — M47816 Spondylosis without myelopathy or radiculopathy, lumbar region: Secondary | ICD-10-CM

## 2015-11-12 DIAGNOSIS — G588 Other specified mononeuropathies: Secondary | ICD-10-CM

## 2015-11-12 DIAGNOSIS — M48062 Spinal stenosis, lumbar region with neurogenic claudication: Secondary | ICD-10-CM

## 2015-11-12 MED ORDER — OXYCODONE HCL 5 MG PO TABS: ORAL_TABLET | ORAL | Status: DC

## 2015-11-12 NOTE — Progress Notes (Signed)
Safety precautions to be maintained throughout the outpatient stay will include: orient to surroundings, keep bed in low position, maintain call bell within reach at all times, provide assistance with transfer out of bed and ambulation.  

## 2015-11-12 NOTE — Progress Notes (Signed)
   Subjective:    Patient ID: Gina Costa, female    DOB: April 18, 1961, 55 y.o.   MRN: [id number]  HPI  The patient is a 55 year old female who returns to pain management for further evaluation and treatment of pain involving the lower back and lower extremity region. The patient has had some improvement of her symptoms with interventional treatment performed in pain management. At the present time we will avoid additional interventional treatment. We are requesting the patient undergo neurosurgical evaluation of pain involving the lumbar lower extremity region associated with weakness. The patient will follow-up with Dr. Posey Pronto and Elsworth Soho industry regard as well. We have been unable to schedule patient for neurosurgical evaluation due to patient's insurance status. We will continue medications at this time including oxycodone and we will remain available to consider additional modifications of treatment pending response to treatment and follow-up evaluation. All agreed to suggested treatment plan      Review of Systems     Objective:   Physical Exam  There was tenderness of the splenius capitis and occipitalis region a mild to moderate degree with mild to moderate tenderness of the splenius capitis and occipitalis regions on the left as well as on the right palpation of the cervical facet cervical paraspinal musculature region was with tenderness to palpation of mild to moderate degree with palpation over the lumbar region lumbar facet region reproducing moderately severe discomfort with lateral bending rotation extension and palpation of the lumbar facets reproducing moderately severe discomfort. The patient appeared to be with bilaterally equal grip strength with Tinel and Phalen's maneuver reproducing minimal discomfort. Palpation over the PSIS and PII S region reproduced moderate discomfort. Straight leg raise was limited to approximately 20 without a definite increase of pain with  dorsiflexion noted. EHL strength appeared to be slightly decreased. There was negative clonus negative Homans. Abdomen nontender with no costovertebral tenderness noted. There was negative clonus negative Homans. Abdomen nontender with no costovertebral tenderness noted    Assessment & Plan:       Degenerative disc disease lumbar spine Multilevel degenerative changes lumbar spine asymmetric edema of the right facet joint, L5-S1 level most involved, some progression since prior exam, multilevel facet arthropathy L2-3, L3-4, L4-5, and L5-S1. Disc bulging and stenosis noted on lumbar MRI  Lumbar radiculopathy  Lumbar facet syndrome  Lumbar stenosis with neurogenic claudication      PLAN   Continue present medication oxycodone  F/U PCP Dr. Posey Pronto and Elsworth Soho for evaliation of  BP and general medical condition  F/U surgical evaluation. Neurosurgical reevaluation as discussed. Please discuss neurosurgical evaluation with Dr. Posey Pronto and Elsworth Soho as we have previously discussed. Please have them schedule neurosurgical evaluation for you as previously discussed since we have been unable to schedule the appointment for you Please proceed with neurosurgical evaluation as discussed  F/U neurosurgical evaluation with Dr. Trenton Gammon as discussed  F/U neurological evaluation. Follow-up as discussed with Dr. Melrose Nakayama .  May consider radiofrequency rhizolysis or intraspinal procedures pending response to present treatment and F/U evaluation Pain Management\  Call pain management prior to scheduled return appointment for any concerns regarding condition prior to scheduled return appointment

## 2015-11-12 NOTE — Patient Instructions (Addendum)
PLAN   Continue present medication oxycodone  F/U PCP Dr. Posey Pronto and Elsworth Soho for evaliation of  BP and general medical condition  F/U surgical evaluation. Neurosurgical reevaluation as discussed. Please discuss neurosurgical evaluation with Dr. Posey Pronto and Elsworth Soho as we have previously discussed. Please have them schedule neurosurgical evaluation for you as previously discussed since we have been unable to schedule the appointment for you Please proceed with neurosurgical evaluation as discussed  F/U neurosurgical evaluation with Dr. Trenton Gammon as discussed  F/U neurological evaluation. Follow-up as discussed with Dr. Melrose Nakayama .  May consider radiofrequency rhizolysis or intraspinal procedures pending response to present treatment and F/U evaluation Pain Management\  Call pain management prior to scheduled return appointment for any concerns regarding condition prior to scheduled return appointment

## 2015-12-08 ENCOUNTER — Encounter: Payer: Self-pay | Admitting: Pain Medicine

## 2015-12-08 ENCOUNTER — Ambulatory Visit: Payer: Medicaid Other | Attending: Pain Medicine | Admitting: Pain Medicine

## 2015-12-08 VITALS — BP 156/80 | HR 71 | Temp 98.6°F | Resp 18 | Ht 66.0 in | Wt 178.0 lb

## 2015-12-08 DIAGNOSIS — G588 Other specified mononeuropathies: Secondary | ICD-10-CM

## 2015-12-08 DIAGNOSIS — M47816 Spondylosis without myelopathy or radiculopathy, lumbar region: Secondary | ICD-10-CM

## 2015-12-08 DIAGNOSIS — G5712 Meralgia paresthetica, left lower limb: Secondary | ICD-10-CM

## 2015-12-08 DIAGNOSIS — M5137 Other intervertebral disc degeneration, lumbosacral region: Secondary | ICD-10-CM

## 2015-12-08 DIAGNOSIS — M533 Sacrococcygeal disorders, not elsewhere classified: Secondary | ICD-10-CM

## 2015-12-08 DIAGNOSIS — M48062 Spinal stenosis, lumbar region with neurogenic claudication: Secondary | ICD-10-CM

## 2015-12-08 DIAGNOSIS — M5416 Radiculopathy, lumbar region: Secondary | ICD-10-CM

## 2015-12-08 MED ORDER — OXYCODONE HCL 5 MG PO TABS: ORAL_TABLET | ORAL | Status: DC

## 2015-12-08 NOTE — Progress Notes (Signed)
Safety precautions to be maintained throughout the outpatient stay will include: orient to surroundings, keep bed in low position, maintain call bell within reach at all times, provide assistance with transfer out of bed and ambulation.  

## 2015-12-08 NOTE — Progress Notes (Signed)
   Subjective:    Patient ID: Gina Costa, female    DOB: February 18, 1961, 55 y.o.   MRN: [id number]  HPI  The patient is a 55 year old female who returns to pain management for further evaluation and treatment of pain involving the lumbar lower extremity region predominantly. The patient is undergone prior evaluation Dr. Trenton Gammon at the present time patient is to undergo neurosurgical reevaluation. Patient will addressed the neurosurgical reevaluation with Dr. Posey Pronto and Elsworth Soho. The patient's continues oxycodone at this time. We discuss interventional treatment and patient wished to proceed with lumbar epidural steroid injection at time return appointment in attempt to decrease lumbar pain and lower extremity weakness. We will encourage patient to proceed with neurosurgical evaluation and patient will be address this with her primary care physician as discussed. All agreed to suggested treatment plan  Review of Systems     Objective:   Physical Exam   Tenderness of the splenius capitis and occipitalis region a mild degree with mild tenderness of the cervical and thoracic facet region without crepitus of the thoracic region noted. The patient appeared to be unremarkable Spurling's maneuver and was able to perform drop test without significant difficulty. The patient appeared to be with bilaterally equal grip strength without increased pain with Tinel and Phalen's maneuver. There was decreased range of motion of the lumbar spine with lateral bending rotation extension and palpation of the lumbar facets reproducing moderately severe discomfort. Straight leg raising was tolerates approximately 30 without a definite increased pain with dorsiflexion noted. EHL strength appeared to be decreased. No definite sensory deficit or dermatomal distribution detected. Negative clonus negative. Reevaluation revealed patient to be with questionably increased pain with dorsiflexion with straight leg raising tolerates  approximately 30. DTRs were difficult to elicit. There was no abdominal tends to palpation and no costovertebral tenderness     Assessment & Plan:     Degenerative disc disease lumbar spine Multilevel degenerative changes lumbar spine asymmetric edema of the right facet joint, L5-S1 level most involved, some progression since prior exam, multilevel facet arthropathy L2-3, L3-4, L4-5, and L5-S1. Disc bulging and stenosis noted on lumbar MRI  Lumbar radiculopathy  Lumbar facet syndrome  Lumbar stenosis with neurogenic claudication    PLAN   Continue present medication oxycodone  Lumbar epidural steroid injection to be performed at time return appointment  F/U PCP Dr. Posey Pronto and Elsworth Soho for evaliation of  BP and general medical condition  F/U surgical evaluation. Neurosurgical reevaluation as discussed. Please discuss neurosurgical evaluation with Dr. Posey Pronto and Elsworth Soho as we have previously discussed. Please have them schedule neurosurgical evaluation for you as previously discussed since we have been unable to schedule the appointment for you Please proceed with neurosurgical evaluation as discussed  F/U neurosurgical evaluation with Dr. Trenton Gammon as discussed  Ask the nurses and secretary the date of your MRI of the left knee  F/U neurological evaluation. Follow-up as discussed with Dr. Melrose Nakayama .  May consider radiofrequency rhizolysis or intraspinal procedures pending response to present treatment and F/U evaluation Pain Management\  Call pain management prior to scheduled return appointment for any concerns regarding condition prior to scheduled return appointment

## 2015-12-08 NOTE — Patient Instructions (Addendum)
PLAN   Continue present medication oxycodone  Lumbar epidural steroid injection to be performed at time return appointment  F/U PCP Dr. Posey Pronto and Elsworth Soho for evaliation of  BP and general medical condition  F/U surgical evaluation. Neurosurgical reevaluation as discussed. Please discuss neurosurgical evaluation with Dr. Posey Pronto and Elsworth Soho as we have previously discussed. Please have them schedule neurosurgical evaluation for you as previously discussed since we have been unable to schedule the appointment for you Please proceed with neurosurgical evaluation as discussed  F/U neurosurgical evaluation with Dr. Trenton Gammon as discussed  Ask the nurses and secretary the date of your MRI of the left knee  F/U neurological evaluation. Follow-up as discussed with Dr. Melrose Nakayama .  May consider radiofrequency rhizolysis or intraspinal procedures pending response to present treatment and F/U evaluation Pain Management\  Call pain management prior to scheduled return appointment for any concerns regarding condition prior to scheduled return appointmentEpidural Steroid Injection An epidural steroid injection is given to relieve pain in your neck, back, or legs that is caused by the irritation or swelling of a nerve root. This procedure involves injecting a steroid and numbing medicine (anesthetic) into the epidural space. The epidural space is the space between the outer covering of your spinal cord and the bones that form your backbone (vertebra).  LET [facility] CARE PROVIDER KNOW ABOUT:   Any allergies you have.  All medicines you are taking, including vitamins, herbs, eye drops, creams, and over-the-counter medicines such as aspirin.  Previous problems you or members of your family have had with the use of anesthetics.  Any blood disorders or blood clotting disorders you have.  Previous surgeries you have had.  Medical conditions you have. RISKS AND COMPLICATIONS Generally, this is a safe procedure.  However, as with any procedure, complications can occur. Possible complications of epidural steroid injection include:  Headache.  Bleeding.  Infection.  Allergic reaction to the medicines.  Damage to your nerves. The response to this procedure depends on the underlying cause of the pain and its duration. People who have long-term (chronic) pain are less likely to benefit from epidural steroids than are those people whose pain comes on strong and suddenly. BEFORE THE PROCEDURE   Ask your health care provider about changing or stopping your regular medicines. You may be advised to stop taking blood-thinning medicines a few days before the procedure.  You may be given medicines to reduce anxiety.  Arrange for someone to take you home after the procedure. PROCEDURE   You will remain awake during the procedure. You may receive medicine to make you relaxed.  You will be asked to lie on your stomach.  The injection site will be cleaned.  The injection site will be numbed with a medicine (local anesthetic).  A needle will be injected through your skin into the epidural space.  Your health care provider will use an X-ray machine to ensure that the steroid is delivered closest to the affected nerve. You may have minimal discomfort at this time.  Once the needle is in the right position, the local anesthetic and the steroid will be injected into the epidural space.  The needle will then be removed and a bandage will be applied to the injection site. AFTER THE PROCEDURE   You may be monitored for a short time before you go home.  You may feel weakness or numbness in your arm or leg, which disappears within hours.  You may be allowed to eat, drink, and take your  regular medicine.  You may have soreness at the site of the injection.   This information is not intended to replace advice given to you by your health care provider. Make sure you discuss any questions you have with your  health care provider.   Document Released: 08/16/2007 Document Revised: 01/09/2013 Document Reviewed: 10/26/2012 Elsevier Interactive Patient Education 2016 [employer]  What are the risk, side effects and possible complications? Generally speaking, most procedures are safe.  However, with any procedure there are risks, side effects, and the possibility of complications.  The risks and complications are dependent upon the sites that are lesioned, or the type of nerve block to be performed.  The closer the procedure is to the spine, the more serious the risks are.  Great care is taken when placing the radio frequency needles, block needles or lesioning probes, but sometimes complications can occur.  Infection: Any time there is an injection through the skin, there is a risk of infection.  This is why sterile conditions are used for these blocks.  There are four possible types of infection.  Localized skin infection.  Central Nervous System Infection-This can be in the form of Meningitis, which can be deadly.  Epidural Infections-This can be in the form of an epidural abscess, which can cause pressure inside of the spine, causing compression of the spinal cord with subsequent paralysis. This would require an emergency surgery to decompress, and there are no guarantees that the patient would recover from the paralysis.  Discitis-This is an infection of the intervertebral discs.  It occurs in about 1% of discography procedures.  It is difficult to treat and it may lead to surgery.        2. Pain: the needles have to go through skin and soft tissues, will cause soreness.       3. Damage to internal structures:  The nerves to be lesioned may be near blood vessels or    other nerves which can be potentially damaged.       4. Bleeding: Bleeding is more common if the patient is taking blood thinners such as  aspirin, Coumadin, Ticiid, Plavix, etc., or if he/she have  some genetic predisposition  such as hemophilia. Bleeding into the spinal canal can cause compression of the spinal  cord with subsequent paralysis.  This would require an emergency surgery to  decompress and there are no guarantees that the patient would recover from the  paralysis.       5. Pneumothorax:  Puncturing of a lung is a possibility, every time a needle is introduced in  the area of the chest or upper back.  Pneumothorax refers to free air around the  collapsed lung(s), inside of the thoracic cavity (chest cavity).  Another two possible  complications related to a similar event would include: Hemothorax and Chylothorax.   These are variations of the Pneumothorax, where instead of air around the collapsed  lung(s), you may have blood or chyle, respectively.       6. Spinal headaches: They may occur with any procedures in the area of the spine.       7. Persistent CSF (Cerebro-Spinal Fluid) leakage: This is a rare problem, but may occur  with prolonged intrathecal or epidural catheters either due to the formation of a fistulous  track or a dural tear.       8. Nerve damage: By working so close to the spinal cord, there is always a possibility  of  nerve damage, which could be as serious as a permanent spinal cord injury with  paralysis.       9. Death:  Although rare, severe deadly allergic reactions known as "Anaphylactic  reaction" can occur to any of the medications used.      10. Worsening of the symptoms:  We can always make thing worse.  What are the chances of something like this happening? Chances of any of this occuring are extremely low.  By statistics, you have more of a chance of getting killed in a motor vehicle accident: while driving to the hospital than any of the above occurring .  Nevertheless, you should be aware that they are possibilities.  In general, it is similar to taking a shower.  Everybody knows that you can slip, hit your head and get killed.  Does that mean that you  should not shower again?  Nevertheless always keep in mind that statistics do not mean anything if you happen to be on the wrong side of them.  Even if a procedure has a 1 (one) in a 1,000,000 (million) chance of going wrong, it you happen to be that one..Also, keep in mind that by statistics, you have more of a chance of having something go wrong when taking medications.  Who should not have this procedure? If you are on a blood thinning medication (e.g. Coumadin, Plavix, see list of "Blood Thinners"), or if you have an active infection going on, you should not have the procedure.  If you are taking any blood thinners, please inform your physician.  How should I prepare for this procedure?  Do not eat or drink anything at least six hours prior to the procedure.  Bring a driver with you .  It cannot be a taxi.  Come accompanied by an adult that can drive you back, and that is strong enough to help you if your legs get weak or numb from the local anesthetic.  Take all of your medicines the morning of the procedure with just enough water to swallow them.  If you have diabetes, make sure that you are scheduled to have your procedure done first thing in the morning, whenever possible.  If you have diabetes, take only half of your insulin dose and notify our nurse that you have done so as soon as you arrive at the clinic.  If you are diabetic, but only take blood sugar pills (oral hypoglycemic), then do not take them on the morning of your procedure.  You may take them after you have had the procedure.  Do not take aspirin or any aspirin-containing medications, at least eleven (11) days prior to the procedure.  They may prolong bleeding.  Wear loose fitting clothing that may be easy to take off and that you would not mind if it got stained with Betadine or blood.  Do not wear any jewelry or perfume  Remove any nail coloring.  It will interfere with some of our monitoring equipment.  NOTE:  Remember that this is not meant to be interpreted as a complete list of all possible complications.  Unforeseen problems may occur.  BLOOD THINNERS The following drugs contain aspirin or other products, which can cause increased bleeding during surgery and should not be taken for 2 weeks prior to and 1 week after surgery.  If you should need take something for relief of minor pain, you may take acetaminophen which is found in Tylenol,m Datril, Anacin-3 and Panadol. It is  not blood thinner. The products listed below are.  Do not take any of the products listed below in addition to any listed on your instruction sheet.  A.P.C or A.P.C with Codeine Codeine Phosphate Capsules #3 Ibuprofen Ridaura  ABC compound Congesprin Imuran rimadil  Advil Cope Indocin Robaxisal  Alka-Seltzer Effervescent Pain Reliever and Antacid Coricidin or Coricidin-D  Indomethacin Rufen  Alka-Seltzer plus Cold Medicine Cosprin Ketoprofen S-A-C Tablets  Anacin Analgesic Tablets or Capsules Coumadin Korlgesic Salflex  Anacin Extra Strength Analgesic tablets or capsules CP-2 Tablets Lanoril Salicylate  Anaprox Cuprimine Capsules Levenox Salocol  Anexsia-D Dalteparin Magan Salsalate  Anodynos Darvon compound Magnesium Salicylate Sine-off  Ansaid Dasin Capsules Magsal Sodium Salicylate  Anturane Depen Capsules Marnal Soma  APF Arthritis pain formula Dewitt's Pills Measurin Stanback  Argesic Dia-Gesic Meclofenamic Sulfinpyrazone  Arthritis Bayer Timed Release Aspirin Diclofenac Meclomen Sulindac  Arthritis pain formula Anacin Dicumarol Medipren Supac  Analgesic (Safety coated) Arthralgen Diffunasal Mefanamic Suprofen  Arthritis Strength Bufferin Dihydrocodeine Mepro Compound Suprol  Arthropan liquid Dopirydamole Methcarbomol with Aspirin Synalgos  ASA tablets/Enseals Disalcid Micrainin Tagament  Ascriptin Doan's Midol Talwin  Ascriptin A/D Dolene Mobidin Tanderil  Ascriptin Extra Strength Dolobid Moblgesic Ticlid   Ascriptin with Codeine Doloprin or Doloprin with Codeine Momentum Tolectin  Asperbuf Duoprin Mono-gesic Trendar  Aspergum Duradyne Motrin or Motrin IB Triminicin  Aspirin plain, buffered or enteric coated Durasal Myochrisine Trigesic  Aspirin Suppositories Easprin Nalfon Trillsate  Aspirin with Codeine Ecotrin Regular or Extra Strength Naprosyn Uracel  Atromid-S Efficin Naproxen Ursinus  Auranofin Capsules Elmiron Neocylate Vanquish  Axotal Emagrin Norgesic Verin  Azathioprine Empirin or Empirin with Codeine Normiflo Vitamin E  Azolid Emprazil Nuprin Voltaren  Bayer Aspirin plain, buffered or children's or timed BC Tablets or powders Encaprin Orgaran Warfarin Sodium  Buff-a-Comp Enoxaparin Orudis Zorpin  Buff-a-Comp with Codeine Equegesic Os-Cal-Gesic   Buffaprin Excedrin plain, buffered or Extra Strength Oxalid   Bufferin Arthritis Strength Feldene Oxphenbutazone   Bufferin plain or Extra Strength Feldene Capsules Oxycodone with Aspirin   Bufferin with Codeine Fenoprofen Fenoprofen Pabalate or Pabalate-SF   Buffets II Flogesic Panagesic   Buffinol plain or Extra Strength Florinal or Florinal with Codeine Panwarfarin   Buf-Tabs Flurbiprofen Penicillamine   Butalbital Compound Four-way cold tablets Penicillin   Butazolidin Fragmin Pepto-Bismol   Carbenicillin Geminisyn Percodan   Carna Arthritis Reliever Geopen Persantine   Carprofen Gold's salt Persistin   Chloramphenicol Goody's Phenylbutazone   Chloromycetin Haltrain Piroxlcam   Clmetidine heparin Plaquenil   Cllnoril Hyco-pap Ponstel   Clofibrate Hydroxy chloroquine Propoxyphen         Before stopping any of these medications, be sure to consult the physician who ordered them.  Some, such as Coumadin (Warfarin) are ordered to prevent or treat serious conditions such as "deep thrombosis", "pumonary embolisms", and other heart problems.  The amount of time that you may need off of the medication may also vary with the medication  and the reason for which you were taking it.  If you are taking any of these medications, please make sure you notify your pain physician before you undergo any procedures.

## 2015-12-23 ENCOUNTER — Ambulatory Visit: Payer: Medicaid Other | Attending: Pain Medicine | Admitting: Pain Medicine

## 2015-12-23 ENCOUNTER — Encounter: Payer: Self-pay | Admitting: Pain Medicine

## 2015-12-23 VITALS — BP 127/66 | HR 74 | Temp 98.2°F | Resp 16 | Ht 64.0 in | Wt 178.0 lb

## 2015-12-23 DIAGNOSIS — M5126 Other intervertebral disc displacement, lumbar region: Secondary | ICD-10-CM | POA: Insufficient documentation

## 2015-12-23 DIAGNOSIS — M47816 Spondylosis without myelopathy or radiculopathy, lumbar region: Secondary | ICD-10-CM | POA: Insufficient documentation

## 2015-12-23 DIAGNOSIS — M4806 Spinal stenosis, lumbar region: Secondary | ICD-10-CM | POA: Insufficient documentation

## 2015-12-23 DIAGNOSIS — M545 Low back pain: Secondary | ICD-10-CM | POA: Diagnosis present

## 2015-12-23 DIAGNOSIS — M5416 Radiculopathy, lumbar region: Secondary | ICD-10-CM

## 2015-12-23 DIAGNOSIS — M48062 Spinal stenosis, lumbar region with neurogenic claudication: Secondary | ICD-10-CM

## 2015-12-23 DIAGNOSIS — M1288 Other specific arthropathies, not elsewhere classified, other specified site: Secondary | ICD-10-CM | POA: Diagnosis not present

## 2015-12-23 DIAGNOSIS — G588 Other specified mononeuropathies: Secondary | ICD-10-CM

## 2015-12-23 DIAGNOSIS — M533 Sacrococcygeal disorders, not elsewhere classified: Secondary | ICD-10-CM

## 2015-12-23 DIAGNOSIS — M5136 Other intervertebral disc degeneration, lumbar region: Secondary | ICD-10-CM | POA: Diagnosis not present

## 2015-12-23 DIAGNOSIS — G5712 Meralgia paresthetica, left lower limb: Secondary | ICD-10-CM

## 2015-12-23 DIAGNOSIS — M79606 Pain in leg, unspecified: Secondary | ICD-10-CM | POA: Diagnosis present

## 2015-12-23 DIAGNOSIS — M5137 Other intervertebral disc degeneration, lumbosacral region: Secondary | ICD-10-CM

## 2015-12-23 MED ORDER — DOXYCYCLINE HYCLATE 100 MG PO TABS: 100.0000 mg | ORAL_TABLET | Freq: Two times a day (BID) | ORAL | Status: DC

## 2015-12-23 MED ORDER — ORPHENADRINE CITRATE 30 MG/ML IJ SOLN
INTRAMUSCULAR | Status: AC
  Filled 2015-12-23: qty 2

## 2015-12-23 MED ORDER — SODIUM CHLORIDE 0.9 % IJ SOLN
INTRAMUSCULAR | Status: AC
  Administered 2015-12-23: 4 mL
  Filled 2015-12-23: qty 20

## 2015-12-23 MED ORDER — LIDOCAINE HCL (PF) 1 % IJ SOLN
10.0000 mL | Freq: Once | INTRAMUSCULAR | Status: AC
  Administered 2015-12-23: 10 mL via SUBCUTANEOUS

## 2015-12-23 MED ORDER — BUPIVACAINE HCL (PF) 0.25 % IJ SOLN
INTRAMUSCULAR | Status: AC
  Filled 2015-12-23: qty 30

## 2015-12-23 MED ORDER — LIDOCAINE HCL (PF) 1 % IJ SOLN
INTRAMUSCULAR | Status: AC
  Administered 2015-12-23: 10 mL via SUBCUTANEOUS
  Filled 2015-12-23: qty 5

## 2015-12-23 MED ORDER — TRIAMCINOLONE ACETONIDE 40 MG/ML IJ SUSP
INTRAMUSCULAR | Status: AC
  Administered 2015-12-23: 40 mg
  Filled 2015-12-23: qty 1

## 2015-12-23 MED ORDER — OXYCODONE HCL 5 MG PO TABS: ORAL_TABLET | ORAL | 0 refills | Status: DC

## 2015-12-23 MED ORDER — SODIUM CHLORIDE 0.9% FLUSH: 20.0000 mL | Freq: Once | INTRAVENOUS | Status: DC

## 2015-12-23 MED ORDER — TRIAMCINOLONE ACETONIDE 40 MG/ML IJ SUSP
40.0000 mg | Freq: Once | INTRAMUSCULAR | Status: AC
  Administered 2015-12-23: 40 mg

## 2015-12-23 NOTE — Progress Notes (Signed)
   PROCEDURE PERFORMED: Lumbar epidural steroid injection   NOTE: The patient is a 55 y.o. female who returns to [place] for further evaluation and treatment of pain involving the lumbar and lower extremity region. 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 Disc bulging and stenosis noted on lumbar MRI. 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 We will proceed with lumbar epidural steroid injection as discussed and as explained to the patient who is willing to proceed with procedure as planned.   DESCRIPTION OF PROCEDURE: Lumbar epidural steroid injection with EKG, blood pressure, pulse, capnography, and pulse oximetry monitoring. The procedure was performed with the patient in the prone position under fluoroscopic guidance. A local anesthetic skin wheal of 1.5% plain lidocaine was accomplished at proposed entry site. An 18-gauge Tuohy epidural needle was inserted at the L 4 vertebral body level right of the midline via loss-of-resistance technique with negative heme and negative CSF return. A total of 4 mL of Preservative-Free normal saline with 40 mg of Kenalog injected incrementally via epidurally placed needle. Needle was removed.    A total of 40 mg of Kenalog was utilized for the procedure.   The patient tolerated the injection well.    PLAN:   1. Medications: We will continue presently prescribed medication oxycodone. 2. Will consider modification of treatment  regimen pending response to treatment rendered on today's visit and follow-up evaluation. 3. The patient is to follow-up with primary care physician Dr. Posey Pronto and Elsworth Soho regarding blood pressure and general medical condition status post lumbar epidural steroid injection performed on today's visit. 4. Surgical evaluation. Neurosurgical reevaluation as planned. The patient will have her primary care physician scheduled neurosurgical reevaluation as discussed. The patient will follow-up with Dr. Trenton Gammon as needed 5. Neurological evaluation. May consider [facility] EMG studies and other studies 6. The patient may be a candidate for radiofrequency procedures, implantation device, and other treatment pending response to treatment and follow-up evaluation. 7. The patient has been advised to adhere to proper body mechanics and avoid activities which appear to aggravate condition. 8. The patient has been advised to call the Pain Management Center prior to scheduled return appointment should there be significant change in condition or should there be sign  The patient is understanding and agrees with the suggested  treatment plan

## 2015-12-23 NOTE — Patient Instructions (Addendum)
PLAN   Continue present medication oxycodone. Please get doxycycline antibiotic today and begin taking doxycycline antibiotic today as prescribed  F/U PCP Dr. Posey Pronto and Elsworth Soho for evaliation of  BP and general medical  condition. Please discuss neurosurgical evaluation with Dr. Posey Pronto and Elsworth Soho and ask them once again to schedule neurosurgical evaluation for you as we previously discussed  F/U surgical evaluation. Neurosurgical reevaluation as discussed  F/U neurological evaluation. Follow-up appointment with Dr. Melrose Nakayama as planned  May consider radiofrequency rhizolysis or intraspinal procedures pending response to present treatment and F/U evaluation  Patient is to call pain management prior to scheduled return appointment should there be any concerns regarding condition  Epidural Steroid Injection Patient Information  Description: The epidural space surrounds the nerves as they exit the spinal cord.  In some patients, the nerves can be compressed and inflamed by a bulging disc or a tight spinal canal (spinal stenosis).  By injecting steroids into the epidural space, we can bring irritated nerves into direct contact with a potentially helpful medication.  These steroids act directly on the irritated nerves and can reduce swelling and inflammation which often leads to decreased pain.  Epidural steroids may be injected anywhere along the spine and from the neck to the low back depending upon the location of your pain.   After numbing the skin with local anesthetic (like Novocaine), a small needle is passed into the epidural space slowly.  You may experience a sensation of pressure while this is being done.  The entire block usually last less than 10 minutes.  Conditions which may be treated by epidural steroids:   Low back and leg pain  Neck and arm pain  Spinal stenosis  Post-laminectomy syndrome  Herpes zoster (shingles) pain  Pain from compression fractures  Preparation for the  injection:  1. Do not eat any solid food or dairy products within 8 hours of your appointment.  2. You may drink clear liquids up to 3 hours before appointment.  Clear liquids include water, black coffee, juice or soda.  No milk or cream please. 3. You may take your regular medication, including pain medications, with a sip of water before your appointment  Diabetics should hold regular insulin (if taken separately) and take 1/2 normal NPH dos the morning of the procedure.  Carry some sugar containing items with you to your appointment. 4. A driver must accompany you and be prepared to drive you home after your procedure.  5. Bring all your current medications with your. 6. An IV may be inserted and sedation may be given at the discretion of the physician.   7. A blood pressure cuff, EKG and other monitors will often be applied during the procedure.  Some patients may need to have extra oxygen administered for a short period. 8. You will be asked to provide medical information, including your allergies, prior to the procedure.  We must know immediately if you are taking blood thinners (like Coumadin/Warfarin)  Or if you are allergic to IV iodine contrast (dye). We must know if you could possible be pregnant.  Possible side-effects:  Bleeding from needle site  Infection (rare, may require surgery)  Nerve injury (rare)  Numbness & tingling (temporary)  Difficulty urinating (rare, temporary)  Spinal headache ( a headache worse with upright posture)  Light -headedness (temporary)  Pain at injection site (several days)  Decreased blood pressure (temporary)  Weakness in arm/leg (temporary)  Pressure sensation in back/neck (temporary)  Call if you experience:  Fever/chills associated with headache or increased back/neck pain.  Headache worsened by an upright position.  New onset weakness or numbness of an extremity below the injection site  Hives or difficulty breathing (go to the  emergency room)  Inflammation or drainage at the infection site  Severe back/neck pain  Any new symptoms which are concerning to you  Please note:  Although the local anesthetic injected can often make your back or neck feel good for several hours after the injection, the pain will likely return.  It takes 3-7 days for steroids to work in the epidural space.  You may not notice any pain relief for at least that one week.  If effective, we will often do a series of three injections spaced 3-6 weeks apart to maximally decrease your pain.  After the initial series, we generally will wait several months before considering a repeat injection of the same type.  If you have any questions, please call [phone] [facility]  Pain Management Discharge Instructions  General Discharge Instructions :  If you need to reach your doctor call: Monday-Friday 8:00 am - 4:00 pm at [phone] or toll free [phone].  After clinic hours [phone] to have operator reach doctor.  Bring all of your medication bottles to all your appointments in the pain clinic.  To cancel or reschedule your appointment with Pain Management please remember to call 24 hours in advance to avoid a fee.  Refer to the educational materials which you have been given on: General Risks, I had my Procedure. Discharge Instructions, Post Sedation.  Post Procedure Instructions:   Please notify your doctor immediately if you have any unusual bleeding, trouble breathing or pain that is not related to your normal pain.  Depending on the type of procedure that was done, some parts of your body may feel week and/or numb.  This usually clears up by tonight or the next day.  Walk with the use of an assistive device or accompanied by an adult for the 24 hours.  You may use ice on the affected area for the first 24 hours.  Put ice in a Ziploc bag and cover with a towel and place against area 15  minutes on 15 minutes off.  You may switch to heat after 24 hours.

## 2015-12-24 ENCOUNTER — Telehealth: Payer: Self-pay

## 2015-12-24 NOTE — Telephone Encounter (Signed)
No answer- Unable to leave voicemail due to box not being set up.

## 2016-01-05 ENCOUNTER — Encounter: Payer: Self-pay | Admitting: Pain Medicine

## 2016-01-05 ENCOUNTER — Ambulatory Visit: Payer: Medicaid Other | Attending: Pain Medicine | Admitting: Pain Medicine

## 2016-01-05 VITALS — BP 120/63 | HR 65 | Temp 98.4°F | Resp 16 | Ht 65.0 in | Wt 176.0 lb

## 2016-01-05 DIAGNOSIS — M79606 Pain in leg, unspecified: Secondary | ICD-10-CM | POA: Diagnosis present

## 2016-01-05 DIAGNOSIS — M5116 Intervertebral disc disorders with radiculopathy, lumbar region: Secondary | ICD-10-CM | POA: Insufficient documentation

## 2016-01-05 DIAGNOSIS — M5416 Radiculopathy, lumbar region: Secondary | ICD-10-CM

## 2016-01-05 DIAGNOSIS — M1288 Other specific arthropathies, not elsewhere classified, other specified site: Secondary | ICD-10-CM | POA: Diagnosis not present

## 2016-01-05 DIAGNOSIS — M545 Low back pain: Secondary | ICD-10-CM | POA: Diagnosis present

## 2016-01-05 DIAGNOSIS — M5137 Other intervertebral disc degeneration, lumbosacral region: Secondary | ICD-10-CM

## 2016-01-05 DIAGNOSIS — M4726 Other spondylosis with radiculopathy, lumbar region: Secondary | ICD-10-CM | POA: Diagnosis not present

## 2016-01-05 DIAGNOSIS — M5126 Other intervertebral disc displacement, lumbar region: Secondary | ICD-10-CM | POA: Diagnosis not present

## 2016-01-05 DIAGNOSIS — M533 Sacrococcygeal disorders, not elsewhere classified: Secondary | ICD-10-CM

## 2016-01-05 DIAGNOSIS — M48062 Spinal stenosis, lumbar region with neurogenic claudication: Secondary | ICD-10-CM

## 2016-01-05 DIAGNOSIS — M4806 Spinal stenosis, lumbar region: Secondary | ICD-10-CM | POA: Insufficient documentation

## 2016-01-05 DIAGNOSIS — M47816 Spondylosis without myelopathy or radiculopathy, lumbar region: Secondary | ICD-10-CM

## 2016-01-05 DIAGNOSIS — G588 Other specified mononeuropathies: Secondary | ICD-10-CM

## 2016-01-05 MED ORDER — GABAPENTIN 300 MG PO CAPS: ORAL_CAPSULE | ORAL | 0 refills | Status: DC

## 2016-01-05 MED ORDER — OXYCODONE HCL 5 MG PO TABS: ORAL_TABLET | ORAL | 0 refills | Status: DC

## 2016-01-05 NOTE — Patient Instructions (Addendum)
PLAN   Continue present medication oxycodone and resume Neurontin  We resumed Neurontin today CAUTION Neurontin and oxycodone and other medications have significant side effects Medications can cause respiratory depression and cause you to stop breathing, cause excessive sedation, cause confusion and other side effects.  Exercise extreme caution when taking medication and call EMS or go to the Emergency Department immediately if you develop any of these symptoms   Lumbar facet, medial branch nerve blocks to be performed at time return appointment  F/U PCP Dr. Posey Pronto and Elsworth Soho for evaliation of  BP and general medical condition  F/U surgical evaluation. Neurosurgical reevaluation as discussed. Please discuss neurosurgical evaluation with Dr. Posey Pronto and Elsworth Soho as we have previously discussed. Please have them schedule neurosurgical evaluation for you as previously discussed since we have been unable to schedule the appointment for you Please proceed with neurosurgical evaluation as discussed previously  F/U neurosurgical evaluation with Dr. Trenton Gammon as discussed  F/U neurological evaluation. Follow-up as discussed with Dr. Melrose Nakayama .  May consider radiofrequency rhizolysis or intraspinal procedures pending response to present treatment and F/U evaluation Pain Management\  Call pain management prior to scheduled return appointment for any concerns regarding condition prior to scheduled return appointmentFacet Joint Block The facet joints connect the bones of the spine (vertebrae). They make it possible for you to bend, twist, and make other movements with your spine. They also prevent you from overbending, overtwisting, and making other excessive movements.  A facet joint block is a procedure where a numbing medicine (anesthetic) is injected into a facet joint. Often, a type of anti-inflammatory medicine called a steroid is also injected. A facet joint block may be done for two reasons:   Diagnosis. A  facet joint block may be done as a test to see whether neck or back pain is caused by a worn-down or infected facet joint. If the pain gets better after a facet joint block, it means the pain is probably coming from the facet joint. If the pain does not get better, it means the pain is probably not coming from the facet joint.   Therapy. A facet joint block may be done to relieve neck or back pain caused by a facet joint. A facet joint block is only done as a therapy if the pain does not improve with medicine, exercise programs, physical therapy, and other forms of pain management. LET [facility] CARE PROVIDER KNOW ABOUT:   Any allergies you have.   All medicines you are taking, including vitamins, herbs, eyedrops, and over-the-counter medicines and creams.   Previous problems you or members of your family have had with the use of anesthetics.   Any blood disorders you have had.   Other health problems you have. RISKS AND COMPLICATIONS Generally, having a facet joint block is safe. However, as with any procedure, complications can occur. Possible complications associated with having a facet joint block include:   Bleeding.   Injury to a nerve near the injection site.   Pain at the injection site.   Weakness or numbness in areas controlled by nerves near the injection site.   Infection.   Temporary fluid retention.   Allergic reaction to anesthetics or medicines used during the procedure. BEFORE THE PROCEDURE   Follow your health care provider's instructions if you are taking dietary supplements or medicines. You may need to stop taking them or reduce your dosage.   Do not take any new dietary supplements or medicines without asking your health care  provider first.   Follow your health care provider's instructions about eating and drinking before the procedure. You may need to stop eating and drinking several hours before the procedure.   Arrange to have an adult  drive you home after the procedure. PROCEDURE  You may need to remove your clothing and dress in an open-back gown so that your health care provider can access your spine.   The procedure will be done while you are lying on an X-ray table. Most of the time you will be asked to lie on your stomach, but you may be asked to lie in a different position if an injection will be made in your neck.   Special machines will be used to monitor your oxygen levels, heart rate, and blood pressure.   If an injection will be made in your neck, an intravenous (IV) tube will be inserted into one of your veins. Fluids and medicine will flow directly into your body through the IV tube.   The area over the facet joint where the injection will be made will be cleaned with an antiseptic soap. The surrounding skin will be covered with sterile drapes.   An anesthetic will be applied to your skin to make the injection area numb. You may feel a temporary stinging or burning sensation.   A video X-ray machine will be used to locate the joint. A contrast dye may be injected into the facet joint area to help with locating the joint.   When the joint is located, an anesthetic medicine will be injected into the joint through the needle.   Your health care provider will ask you whether you feel pain relief. If you do feel relief, a steroid may be injected to provide pain relief for a longer period of time. If you do not feel relief or feel only partial relief, additional injections of an anesthetic may be made in other facet joints.   The needle will be removed, the skin will be cleansed, and bandages will be applied.  AFTER THE PROCEDURE   You will be observed for 15-30 minutes before being allowed to go home. Do not drive. Have an adult drive you or take a taxi or public transportation instead.   If you feel pain relief, the pain will return in several hours or days when the anesthetic wears off.   You may  feel pain relief 2-14 days after the procedure. The amount of time this relief lasts varies from person to person.   It is normal to feel some tenderness over the injected area(s) for 2 days following the procedure.   If you have diabetes, you may have a temporary increase in blood sugar.   This information is not intended to replace advice given to you by your health care provider. Make sure you discuss any questions you have with your health care provider.   Document Released: 09/28/2006 Document Revised: 05/30/2014 Document Reviewed: 02/27/2012 Elsevier Interactive Patient Education 2016 [place]  What are the risk, side effects and possible complications? Generally speaking, most procedures are safe.  However, with any procedure there are risks, side effects, and the possibility of complications.  The risks and complications are dependent upon the sites that are lesioned, or the type of nerve block to be performed.  The closer the procedure is to the spine, the more serious the risks are.  Great care is taken when placing the radio frequency needles, block needles or lesioning probes,  but sometimes complications can occur. 1. Infection: Any time there is an injection through the skin, there is a risk of infection.  This is why sterile conditions are used for these blocks.  There are four possible types of infection. 1. Localized skin infection. 2. Central Nervous System Infection-This can be in the form of Meningitis, which can be deadly. 3. Epidural Infections-This can be in the form of an epidural abscess, which can cause pressure inside of the spine, causing compression of the spinal cord with subsequent paralysis. This would require an emergency surgery to decompress, and there are no guarantees that the patient would recover from the paralysis. 4. Discitis-This is an infection of the intervertebral discs.  It occurs in about 1% of discography  procedures.  It is difficult to treat and it may lead to surgery.        2. Pain: the needles have to go through skin and soft tissues, will cause soreness.       3. Damage to internal structures:  The nerves to be lesioned may be near blood vessels or    other nerves which can be potentially damaged.       4. Bleeding: Bleeding is more common if the patient is taking blood thinners such as  aspirin, Coumadin, Ticiid, Plavix, etc., or if he/she have some genetic predisposition  such as hemophilia. Bleeding into the spinal canal can cause compression of the spinal  cord with subsequent paralysis.  This would require an emergency surgery to  decompress and there are no guarantees that the patient would recover from the  paralysis.       5. Pneumothorax:  Puncturing of a lung is a possibility, every time a needle is introduced in  the area of the chest or upper back.  Pneumothorax refers to free air around the  collapsed lung(s), inside of the thoracic cavity (chest cavity).  Another two possible  complications related to a similar event would include: Hemothorax and Chylothorax.   These are variations of the Pneumothorax, where instead of air around the collapsed  lung(s), you may have blood or chyle, respectively.       6. Spinal headaches: They may occur with any procedures in the area of the spine.       7. Persistent CSF (Cerebro-Spinal Fluid) leakage: This is a rare problem, but may occur  with prolonged intrathecal or epidural catheters either due to the formation of a fistulous  track or a dural tear.       8. Nerve damage: By working so close to the spinal cord, there is always a possibility of  nerve damage, which could be as serious as a permanent spinal cord injury with  paralysis.       9. Death:  Although rare, severe deadly allergic reactions known as "Anaphylactic  reaction" can occur to any of the medications used.      10. Worsening of the symptoms:  We can always make thing worse.  What  are the chances of something like this happening? Chances of any of this occuring are extremely low.  By statistics, you have more of a chance of getting killed in a motor vehicle accident: while driving to the hospital than any of the above occurring .  Nevertheless, you should be aware that they are possibilities.  In general, it is similar to taking a shower.  Everybody knows that you can slip, hit your head and get killed.  Does that mean that you  should not shower again?  Nevertheless always keep in mind that statistics do not mean anything if you happen to be on the wrong side of them.  Even if a procedure has a 1 (one) in a 1,000,000 (million) chance of going wrong, it you happen to be that one..Also, keep in mind that by statistics, you have more of a chance of having something go wrong when taking medications.  Who should not have this procedure? If you are on a blood thinning medication (e.g. Coumadin, Plavix, see list of "Blood Thinners"), or if you have an active infection going on, you should not have the procedure.  If you are taking any blood thinners, please inform your physician.  How should I prepare for this procedure?  Do not eat or drink anything at least six hours prior to the procedure.  Bring a driver with you .  It cannot be a taxi.  Come accompanied by an adult that can drive you back, and that is strong enough to help you if your legs get weak or numb from the local anesthetic.  Take all of your medicines the morning of the procedure with just enough water to swallow them.  If you have diabetes, make sure that you are scheduled to have your procedure done first thing in the morning, whenever possible.  If you have diabetes, take only half of your insulin dose and notify our nurse that you have done so as soon as you arrive at the clinic.  If you are diabetic, but only take blood sugar pills (oral hypoglycemic), then do not take them on the morning of your procedure.   You may take them after you have had the procedure.  Do not take aspirin or any aspirin-containing medications, at least eleven (11) days prior to the procedure.  They may prolong bleeding.  Wear loose fitting clothing that may be easy to take off and that you would not mind if it got stained with Betadine or blood.  Do not wear any jewelry or perfume  Remove any nail coloring.  It will interfere with some of our monitoring equipment.  NOTE: Remember that this is not meant to be interpreted as a complete list of all possible complications.  Unforeseen problems may occur.  BLOOD THINNERS The following drugs contain aspirin or other products, which can cause increased bleeding during surgery and should not be taken for 2 weeks prior to and 1 week after surgery.  If you should need take something for relief of minor pain, you may take acetaminophen which is found in Tylenol,m Datril, Anacin-3 and Panadol. It is not blood thinner. The products listed below are.  Do not take any of the products listed below in addition to any listed on your instruction sheet.  A.P.C or A.P.C with Codeine Codeine Phosphate Capsules #3 Ibuprofen Ridaura  ABC compound Congesprin Imuran rimadil  Advil Cope Indocin Robaxisal  Alka-Seltzer Effervescent Pain Reliever and Antacid Coricidin or Coricidin-D  Indomethacin Rufen  Alka-Seltzer plus Cold Medicine Cosprin Ketoprofen S-A-C Tablets  Anacin Analgesic Tablets or Capsules Coumadin Korlgesic Salflex  Anacin Extra Strength Analgesic tablets or capsules CP-2 Tablets Lanoril Salicylate  Anaprox Cuprimine Capsules Levenox Salocol  Anexsia-D Dalteparin Magan Salsalate  Anodynos Darvon compound Magnesium Salicylate Sine-off  Ansaid Dasin Capsules Magsal Sodium Salicylate  Anturane Depen Capsules Marnal Soma  APF Arthritis pain formula Dewitt's Pills Measurin Stanback  Argesic Dia-Gesic Meclofenamic Sulfinpyrazone  Arthritis Bayer Timed Release Aspirin Diclofenac  Meclomen Sulindac  Arthritis pain  formula Anacin Dicumarol Medipren Supac  Analgesic (Safety coated) Arthralgen Diffunasal Mefanamic Suprofen  Arthritis Strength Bufferin Dihydrocodeine Mepro Compound Suprol  Arthropan liquid Dopirydamole Methcarbomol with Aspirin Synalgos  ASA tablets/Enseals Disalcid Micrainin Tagament  Ascriptin Doan's Midol Talwin  Ascriptin A/D Dolene Mobidin Tanderil  Ascriptin Extra Strength Dolobid Moblgesic Ticlid  Ascriptin with Codeine Doloprin or Doloprin with Codeine Momentum Tolectin  Asperbuf Duoprin Mono-gesic Trendar  Aspergum Duradyne Motrin or Motrin IB Triminicin  Aspirin plain, buffered or enteric coated Durasal Myochrisine Trigesic  Aspirin Suppositories Easprin Nalfon Trillsate  Aspirin with Codeine Ecotrin Regular or Extra Strength Naprosyn Uracel  Atromid-S Efficin Naproxen Ursinus  Auranofin Capsules Elmiron Neocylate Vanquish  Axotal Emagrin Norgesic Verin  Azathioprine Empirin or Empirin with Codeine Normiflo Vitamin E  Azolid Emprazil Nuprin Voltaren  Bayer Aspirin plain, buffered or children's or timed BC Tablets or powders Encaprin Orgaran Warfarin Sodium  Buff-a-Comp Enoxaparin Orudis Zorpin  Buff-a-Comp with Codeine Equegesic Os-Cal-Gesic   Buffaprin Excedrin plain, buffered or Extra Strength Oxalid   Bufferin Arthritis Strength Feldene Oxphenbutazone   Bufferin plain or Extra Strength Feldene Capsules Oxycodone with Aspirin   Bufferin with Codeine Fenoprofen Fenoprofen Pabalate or Pabalate-SF   Buffets II Flogesic Panagesic   Buffinol plain or Extra Strength Florinal or Florinal with Codeine Panwarfarin   Buf-Tabs Flurbiprofen Penicillamine   Butalbital Compound Four-way cold tablets Penicillin   Butazolidin Fragmin Pepto-Bismol   Carbenicillin Geminisyn Percodan   Carna Arthritis Reliever Geopen Persantine   Carprofen Gold's salt Persistin   Chloramphenicol Goody's Phenylbutazone   Chloromycetin Haltrain Piroxlcam   Clmetidine  heparin Plaquenil   Cllnoril Hyco-pap Ponstel   Clofibrate Hydroxy chloroquine Propoxyphen         Before stopping any of these medications, be sure to consult the physician who ordered them.  Some, such as Coumadin (Warfarin) are ordered to prevent or treat serious conditions such as "deep thrombosis", "pumonary embolisms", and other heart problems.  The amount of time that you may need off of the medication may also vary with the medication and the reason for which you were taking it.  If you are taking any of these medications, please make sure you notify your pain physician before you undergo any procedures.

## 2016-01-05 NOTE — Progress Notes (Signed)
   The patient is a 55 year old female who returns to pain management for further evaluation and treatment of pain involving the lower back and lower extremity region aggravated by standing walking twisting turning maneuvers. There is concern regarding significant component of patient's pain began due to lumbar facet syndrome. We have discussed patient's condition and for lumbar facet, medial branch nerve blocks to be performed at time return appointment. We will also resume patient's Neurontin and patient will continue oxycodone as prescribed. The patient is to undergo further neurosurgical evaluation as discussed and will follow-up with Dr. Trenton Gammon at this time and consider additional evaluation as well. The patient will discuss neurosurgical evaluation with her primary care physician Dr. Posey Pronto and Freddy Finner. All agreed to suggested treatment plan   Physical examination  There was tenderness of the splenius capitis and occipitalis musculature regions of mild degree with mild tenderness of the cervical and thoracic facet region with no crepitus of the thoracic region noted. Palpation of the acromioclavicular and glenohumeral joint regions reproduce mild discomfort and patient appeared to be with bilaterally equal grip strength. Tinel and Phalen's maneuver were without increased pain of significant degree. Palpation over the lumbar paraspinal muscular treat and lumbar facet region was associated with severely disabling pain with lateral bending rotation extension and palpation over the lumbar facets reproducing severe pain. There was tenderness of the PSIS and PII S region. Palpation of the greater trochanteric region iliotibial band region was with mild discomfort to moderate discomfort. Straight leg raise was tolerates approximately 30 without increased pain with dorsiflexion noted. DTRs appeared to be trace at the knees. There was negative clonus negative Homans. Abdomen nontender with no costovertebral  tenderness noted.    Assessment  Degenerative disc disease lumbar spine Multilevel degenerative changes lumbar spine asymmetric edema of the right facet joint, L5-S1 level most involved, some progression since prior exam, multilevel facet arthropathy L2-3, L3-4, L4-5, and L5-S1. Disc bulging and stenosis noted on lumbar MRI  Lumbar radiculopathy  Lumbar facet syndrome  Lumbar stenosis with neurogenic claudication     PLAN   Continue present medication oxycodone and resume Neurontin  WE RESUMED Neurontin today CAUTION Neurontin and oxycodone and other medications have significant side effects Medications can cause respiratory depression and cause you to stop breathing, cause excessive sedation, cause confusion and other side effects.  Exercise extreme caution when taking medication and call EMS or go to the Emergency Department immediately if you develop any of these symptoms   Lumbar facet, medial branch nerve blocks to be performed at time return appointment  F/U PCP Dr. Posey Pronto and Elsworth Soho for evaliation of  BP and general medical condition  F/U surgical evaluation. Neurosurgical reevaluation as discussed. Please discuss neurosurgical evaluation with Dr. Posey Pronto and Elsworth Soho as we have previously discussed. Please have them schedule neurosurgical evaluation for you as previously discussed since we have been unable to schedule the appointment for you Please proceed with neurosurgical evaluation as discussed previously  F/U neurosurgical evaluation with Dr. Trenton Gammon as discussed  F/U neurological evaluation. Follow-up as discussed with Dr. Melrose Nakayama .  May consider radiofrequency rhizolysis or intraspinal procedures pending response to present treatment and F/U evaluation Pain Management\  Call pain management prior to scheduled return appointment for any concerns regarding condition prior to scheduled return appointment

## 2016-01-05 NOTE — Progress Notes (Signed)
Safety precautions to be maintained throughout the outpatient stay will include: orient to surroundings, keep bed in low position, maintain call bell within reach at all times, provide assistance with transfer out of bed and ambulation.  

## 2016-01-18 ENCOUNTER — Encounter: Payer: Self-pay | Admitting: Pain Medicine

## 2016-01-18 ENCOUNTER — Ambulatory Visit: Payer: Medicaid Other | Attending: Pain Medicine | Admitting: Pain Medicine

## 2016-01-18 VITALS — BP 120/71 | HR 56 | Temp 97.5°F | Resp 19 | Ht 65.0 in | Wt 172.0 lb

## 2016-01-18 DIAGNOSIS — M533 Sacrococcygeal disorders, not elsewhere classified: Secondary | ICD-10-CM

## 2016-01-18 DIAGNOSIS — M5416 Radiculopathy, lumbar region: Secondary | ICD-10-CM

## 2016-01-18 DIAGNOSIS — M47816 Spondylosis without myelopathy or radiculopathy, lumbar region: Secondary | ICD-10-CM | POA: Insufficient documentation

## 2016-01-18 DIAGNOSIS — M1288 Other specific arthropathies, not elsewhere classified, other specified site: Secondary | ICD-10-CM | POA: Insufficient documentation

## 2016-01-18 DIAGNOSIS — M5137 Other intervertebral disc degeneration, lumbosacral region: Secondary | ICD-10-CM

## 2016-01-18 DIAGNOSIS — M79606 Pain in leg, unspecified: Secondary | ICD-10-CM | POA: Diagnosis present

## 2016-01-18 DIAGNOSIS — M545 Low back pain: Secondary | ICD-10-CM | POA: Diagnosis present

## 2016-01-18 DIAGNOSIS — G588 Other specified mononeuropathies: Secondary | ICD-10-CM

## 2016-01-18 DIAGNOSIS — M5136 Other intervertebral disc degeneration, lumbar region: Secondary | ICD-10-CM | POA: Insufficient documentation

## 2016-01-18 MED ORDER — ORPHENADRINE CITRATE 30 MG/ML IJ SOLN: 60.0000 mg | Freq: Once | INTRAMUSCULAR | Status: DC

## 2016-01-18 MED ORDER — FENTANYL CITRATE (PF) 100 MCG/2ML IJ SOLN: 100.0000 ug | Freq: Once | INTRAMUSCULAR | Status: DC

## 2016-01-18 MED ORDER — DOXYCYCLINE HYCLATE 100 MG PO TABS: 100.0000 mg | ORAL_TABLET | Freq: Two times a day (BID) | ORAL | Status: DC

## 2016-01-18 MED ORDER — BUPIVACAINE HCL (PF) 0.25 % IJ SOLN
30.0000 mL | Freq: Once | INTRAMUSCULAR | Status: AC
  Administered 2016-01-18: 30 mL
  Filled 2016-01-18: qty 30

## 2016-01-18 MED ORDER — MIDAZOLAM HCL 5 MG/5ML IJ SOLN: 5.0000 mg | Freq: Once | INTRAMUSCULAR | Status: DC

## 2016-01-18 MED ORDER — TRIAMCINOLONE ACETONIDE 40 MG/ML IJ SUSP
40.0000 mg | Freq: Once | INTRAMUSCULAR | Status: DC
  Filled 2016-01-18: qty 1

## 2016-01-18 MED ORDER — ORPHENADRINE CITRATE 30 MG/ML IJ SOLN
INTRAMUSCULAR | Status: AC
  Administered 2016-01-18: 12:00:00
  Filled 2016-01-18: qty 2

## 2016-01-18 MED ORDER — SODIUM CHLORIDE 0.9% FLUSH: 20.0000 mL | Freq: Once | INTRAVENOUS | Status: DC

## 2016-01-18 MED ORDER — FENTANYL CITRATE (PF) 100 MCG/2ML IJ SOLN
INTRAMUSCULAR | Status: AC
  Administered 2016-01-18: 100 ug via INTRAVENOUS
  Filled 2016-01-18: qty 2

## 2016-01-18 MED ORDER — MIDAZOLAM HCL 5 MG/5ML IJ SOLN
INTRAMUSCULAR | Status: AC
  Administered 2016-01-18: 4 mg
  Filled 2016-01-18: qty 5

## 2016-01-18 MED ORDER — LACTATED RINGERS IV SOLN
1000.0000 mL | INTRAVENOUS | Status: DC
  Administered 2016-01-18: 1000 mL via INTRAVENOUS

## 2016-01-18 NOTE — Progress Notes (Signed)
PROCEDURE PERFORMED: Lumbar facet (medial branch block)   NOTE: The patient is a 55 y.o. female who returns to [facility] for further evaluation and treatment of pain involving the lumbar and lower extremity region. MRI  revealed the patient to be with evidence of degenerative disc disease lumbar spine Multilevel degenerative changes lumbar spine asymmetric edema of the right facet joint, L5-S1 level most involved, some progression since prior exam, multilevel facet arthropathy L2-3, L3-4, L4-5, and L5-S1. Disc bulging and stenosis noted on lumbar MRI. The risks, benefits, and expectations of the procedure have been discussed and explained to the patient who was understanding and in agreement with suggested treatment plan. We will proceed with interventional treatment as discussed and as explained to the patient who was understanding and wished to proceed with procedure as planned.   DESCRIPTION OF PROCEDURE: Lumbar facet (medial branch block) with IV Versed, IV fentanyl conscious sedation, EKG, blood pressure, pulse, capnography, and pulse oximetry monitoring. The procedure was performed with the patient in the prone position. Betadine prep of proposed entry site performed.   NEEDLE PLACEMENT AT: Left L 3 lumbar facet (medial branch block). Under fluoroscopic guidance with oblique orientation of 15 degrees, a 22-gauge needle was inserted at the L 3 vertebral body level with needle placed at the targeted area of Burton's Eye or Eye of the Scotty Dog with documentation of needle placement in the superior and lateral border of targeted area of Burton's Eye or Eye of the Scotty Dog with oblique orientation of 15 degrees. Following documentation of needle placement at the L 3 vertebral body level, needle placement was then accomplished at the L 4 vertebral body level.   NEEDLE PLACEMENT AT L4 and L5 VERTEBRAL BODY LEVELS ON THE LEFT SIDE The procedure was performed at the L4 and L5 vertebral  body levels exactly as was performed at the L 3 vertebral body level utilizing the same technique and under fluoroscopic guidance.  NEEDLE PLACEMENT AT THE SACRAL ALA with AP view of the lumbosacral spine. With the patient in the prone position, Betadine prep of proposed entry site accomplished, a 22 gauge needle was inserted in the region of the sacral ala (groove formed by the superior articulating process of S1 and the sacral wing). Following documentation of needle placement at the sacral ala,   NEEDLE PLACEMENT AT THE S1 FORAMEN LEVEL under fluoroscopic guidance with AP view of the lumbosacral spine and cephalad orientation of the fluoroscope, a 22-gauge needle was placed at the superior and lateral border of the S1 foramen under fluoroscopic guidance. Following documentation of needle placement at the S1 foramen.   Needle placement was then verified at all levels on lateral view. Following documentation of needle placement at all levels on lateral view and following negative aspiration for heme and CSF, each level was injected with 1 mL of 0.25% bupivacaine with Kenalog.     LUMBAR FACET, MEDIAL BRANCH NERVE, BLOCKS PERFORMED ON THE RIGHT SIDE   The procedure was performed on the right side exactly as was performed on the left side at the same levels and utilizing the same technique under fluoroscopic guidance.     The patient tolerated the procedure well. A total of 40 mg of Kenalog was utilized for the procedure.   PLAN:  1. Medications: The patient will continue presently prescribed medication oxycodone. 2. May consider modification of treatment regimen at time of return appointment pending response to treatment rendered on today's visit. 3. The patient is  to follow-up with primary care physician Dr. Posey Pronto and Elsworth Soho for further evaluation of blood pressure and general medical condition status post steroid injection performed on today's visit. 4. Surgical follow-up evaluation.  Patient will follow-up with Dr. Trenton Gammon. Further neurosurgical evaluation as discussed 5. Neurological follow-up evaluation. Has been addressed 6. The patient may be candidate for radiofrequency procedures, implantation type procedures, and other treatment pending response to treatment and follow-up evaluation. 7. The patient has been advised to call the [facility] prior to scheduled return appointment should there be significant change in condition or should patient have other concerns regarding condition prior to scheduled return appointment.  The patient is understanding and in agreement with suggested treatment plan.

## 2016-01-18 NOTE — Patient Instructions (Addendum)
PLAN   Continue present medication oxycodone. Please get doxycycline antibiotic today and begin taking doxycycline antibiotic today as prescribed  F/U PCP Dr. Posey Pronto and Elsworth Soho for evaliation of  BP and general medical  condition. Please discuss neurosurgical evaluation with Dr. Posey Pronto and Elsworth Soho and ask them once again to schedule neurosurgical evaluation for you as we previously discussed  F/U surgical evaluation. Neurosurgical reevaluation as discussed. Follow-up with Dr. Trenton Gammon as discussed  F/U neurological evaluation. Follow-up appointment with Dr. Melrose Nakayama as discussed  May consider radiofrequency rhizolysis or intraspinal procedures pending response to present treatment and F/U evaluation  Patient is to call pain management prior to scheduled return appointment should there be any concerns regarding conditionPain Management Discharge Instructions  General Discharge Instructions :  If you need to reach your doctor call: Monday-Friday 8:00 am - 4:00 pm at [phone] or toll free [phone].  After clinic hours [phone] to have operator reach doctor.  Bring all of your medication bottles to all your appointments in the pain clinic.  To cancel or reschedule your appointment with Pain Management please remember to call 24 hours in advance to avoid a fee.  Refer to the educational materials which you have been given on: General Risks, I had my Procedure. Discharge Instructions, Post Sedation.  Post Procedure Instructions:  The drugs you were given will stay in your system until tomorrow, so for the next 24 hours you should not drive, make any legal decisions or drink any alcoholic beverages.  You may eat anything you prefer, but it is better to start with liquids then soups and crackers, and gradually work up to solid foods.  Please notify your doctor immediately if you have any unusual bleeding, trouble breathing or pain that is not related to your normal pain.  Depending on the  type of procedure that was done, some parts of your body may feel week and/or numb.  This usually clears up by tonight or the next day.  Walk with the use of an assistive device or accompanied by an adult for the 24 hours.  You may use ice on the affected area for the first 24 hours.  Put ice in a Ziploc bag and cover with a towel and place against area 15 minutes on 15 minutes off.  You may switch to heat after 24 hours.

## 2016-01-19 NOTE — Telephone Encounter (Signed)
Called Patient no answer- Voice mail not set up unable to leave message

## 2016-01-21 ENCOUNTER — Telehealth: Payer: Self-pay | Admitting: *Deleted

## 2016-02-04 ENCOUNTER — Ambulatory Visit: Payer: Medicaid Other | Admitting: Pain Medicine

## 2016-02-07 ENCOUNTER — Other Ambulatory Visit: Payer: Self-pay | Admitting: Pain Medicine

## 2016-02-14 ENCOUNTER — Other Ambulatory Visit: Payer: Self-pay | Admitting: Pain Medicine

## 2016-04-11 ENCOUNTER — Other Ambulatory Visit: Payer: Self-pay | Admitting: Primary Care

## 2016-04-11 DIAGNOSIS — Z0000 Encounter for general adult medical examination without abnormal findings: Secondary | ICD-10-CM

## 2016-05-05 ENCOUNTER — Emergency Department
Admission: EM | Admit: 2016-05-05 | Discharge: 2016-05-05 | Disposition: A | Discharge status: Home / Self Care | Payer: Medicaid Other | Attending: Emergency Medicine | Admitting: Emergency Medicine

## 2016-05-05 ENCOUNTER — Encounter: Payer: Self-pay | Admitting: Emergency Medicine

## 2016-05-05 DIAGNOSIS — Z79899 Other long term (current) drug therapy: Secondary | ICD-10-CM | POA: Diagnosis not present

## 2016-05-05 DIAGNOSIS — I10 Essential (primary) hypertension: Secondary | ICD-10-CM | POA: Diagnosis not present

## 2016-05-05 DIAGNOSIS — J069 Acute upper respiratory infection, unspecified: Secondary | ICD-10-CM | POA: Insufficient documentation

## 2016-05-05 DIAGNOSIS — R05 Cough: Secondary | ICD-10-CM | POA: Diagnosis present

## 2016-05-05 DIAGNOSIS — E119 Type 2 diabetes mellitus without complications: Secondary | ICD-10-CM | POA: Diagnosis not present

## 2016-05-05 DIAGNOSIS — J45909 Unspecified asthma, uncomplicated: Secondary | ICD-10-CM | POA: Insufficient documentation

## 2016-05-05 DIAGNOSIS — B9789 Other viral agents as the cause of diseases classified elsewhere: Secondary | ICD-10-CM

## 2016-05-05 DIAGNOSIS — F17210 Nicotine dependence, cigarettes, uncomplicated: Secondary | ICD-10-CM | POA: Diagnosis not present

## 2016-05-05 MED ORDER — BENZONATATE 100 MG PO CAPS: 100.0000 mg | ORAL_CAPSULE | Freq: Three times a day (TID) | ORAL | 0 refills | Status: DC | PRN

## 2016-05-05 MED ORDER — PROMETHAZINE-DM 6.25-15 MG/5ML PO SYRP: 5.0000 mL | ORAL_SOLUTION | Freq: Four times a day (QID) | ORAL | 0 refills | Status: DC | PRN

## 2016-05-05 MED ORDER — FLUTICASONE PROPIONATE 50 MCG/ACT NA SUSP: 2.0000 | Freq: Every day | NASAL | 0 refills | Status: DC

## 2016-05-05 NOTE — Discharge Instructions (Signed)
Rest. Push fluids.   May take Tylenol as needed for aches

## 2016-05-05 NOTE — ED Triage Notes (Signed)
States she developed a cough and nasal congestion 2 days ago

## 2016-05-05 NOTE — ED Provider Notes (Signed)
[facility] Emergency Department Provider Note  ____________________________________________  Time seen: Approximately 11:24 AM  I have reviewed the triage vital signs and the nursing notes.   HISTORY  Chief Complaint Cough and URI    HPI Gina Costa is a 55 y.o. female , NAD, presents to the emergency department with 2 day history of cough, chest congestion and body aches. Has had sudden onset of cough, chest congestion, nasal congestion, runny nose, fatigue and body aches over the last couple of days. Has been taking night time cold medicine already and nothing during the day. Has a history of asthma and has noted some wheezing which is been improved by utilizing albuterol breathing treatments at home. Denies any chest pain, shortness breath, abdominal pain, nausea, vomiting or diarrhea. Has had no fevers but has had chills. Notes that the child in the home has been sick with similar symptoms but no specific illness.   Past Medical History:  Diagnosis Date  . Anemia    H/O  . Anxiety   . Anxiety and depression   . Arthritis    back  . Asthma   . Back pain   . Bulging disc   . Diabetes mellitus without complication (HCC)    NO MEDS-DIET CONTROL  . Elevated LFTs   . Gallstones 11-2014  . GERD (gastroesophageal reflux disease)   . Pneumonia   . Sprain of ribs     Patient Active Problem List   Diagnosis Date Noted  . Sebaceous cyst of breast   . Benign essential HTN 01/09/2015  . Cholelithiases 12/13/2014  . Arthralgia of hip 12/03/2014  . Chronic LBP 12/03/2014  . Left leg pain 12/03/2014  . 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 DDD (degenerative disc disease), lumbosacral 09/24/2014  . Lumbar facet arthropathy 09/24/2014  . Lumbar radiculopathy 09/24/2014  . 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 MI (mitral incompetence) 07/02/2014  . Diabetes ([place]) 01/06/2014   . 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 Current tobacco use 01/06/2014  . Benign breast cyst in female 11/02/2012    Past Surgical History:  Procedure Laterality Date  . BREAST CYST ASPIRATION Left 2014   benign  . BREAST CYST ASPIRATION Left 2002  . LIPOMA EXCISION Left 01/21/2015   Procedure: EXCISION SEBACEOUS CYST LEFT CHEST ;  Surgeon: Marlyce Huge, MD;  Location: [facility] ORS;  Service: General;  Laterality: Left;  . TUBAL LIGATION      Prior to Admission medications   Medication Sig Start Date End Date Taking? Authorizing Provider  albuterol (PROVENTIL HFA;VENTOLIN HFA) 108 (90 BASE) MCG/ACT inhaler Inhale 2 puffs using inhaler every four to six hours as needed for wheezing, cough or SOB.    Historical Provider, MD  benzonatate (TESSALON PERLES) 100 MG capsule Take 1 capsule (100 mg total) by mouth 3 (three) times daily as needed for cough. 05/05/16   Lyonel Morejon L Janelle Spellman, PA-C  cyclobenzaprine (FLEXERIL) 10 MG tablet Take by mouth 3 (three) times daily as needed. Reported on 07/02/2015    Historical Provider, MD  FLUoxetine (PROZAC) 40 MG capsule Take 40 mg by mouth every morning. Reported on 11/12/2015    Historical Provider, MD  fluticasone (FLONASE) 50 MCG/ACT nasal spray Place 2 sprays into both nostrils daily. 05/05/16   Mekiah Wahler L Audrena Talaga, PA-C  gabapentin (NEURONTIN) 300 MG capsule Limit 1 tab by mouth 2-3 times per day if tolerated 01/05/16   Mohammed Kindle, MD  LORazepam (ATIVAN) 2 MG tablet Take 2 mg by mouth 2 (two) times daily.    Historical Provider, MD  lurasidone (LATUDA) 40 MG TABS tablet Take 40 mg by mouth at bedtime. Reported on 09/24/2015    Historical Provider, MD  methocarbamol (ROBAXIN) 500 MG tablet Take 500 mg by mouth every 6 (six) hours as needed for muscle spasms (takes 2 tablets q6hprn).    Historical Provider, MD  omeprazole (PRILOSEC) 20 MG capsule Take 20 mg by mouth every morning. Reported on 11/12/2015 08/15/14   Historical Provider, MD   promethazine-dextromethorphan (PROMETHAZINE-DM) 6.25-15 MG/5ML syrup Take 5 mLs by mouth 4 (four) times daily as needed for cough. 05/05/16   Case Vassell L Jaxsen Bernhart, PA-C  ZOLPIDEM TARTRATE PO Take 10 mg by mouth at bedtime.    Historical Provider, MD    Allergies Ibuprofen; Lyrica [pregabalin]; Penicillins; and Sulfa antibiotics  Family History  Problem Relation Age of Onset  . Heart disease Father   . Cancer Maternal Grandmother     breast  . Breast cancer Maternal Grandmother   . Cancer Cousin     breast  . Breast cancer Cousin   . COPD Mother   . Heart disease Mother   . Diabetes Mother   . Hypertension Mother     Social History Social History  Substance Use Topics  . Smoking status: Current Some Day Smoker    Types: Cigarettes  . Smokeless tobacco: Former Systems developer     Comment: occassional smoker  . Alcohol use No     Review of Systems  Constitutional: Positive chills, fatigue. No fevers. Eyes: No visual changes. No discharge ENT: Positive nasal congestion, runny nose. No sore throat. Cardiovascular: No chest pain. Respiratory: Positive cough, chest congestion, wheezing. No shortness of breath, hemoptysis. Gastrointestinal: No abdominal pain.  No nausea, vomiting.  No diarrhea, constipation, hematochezia. Musculoskeletal: Positive for generalized body aches.  Skin: Negative for rash. Neurological: Negative for headaches, focal weakness or numbness. No dizziness, lightheadedness 10-point ROS otherwise negative.  ____________________________________________   PHYSICAL EXAM:  VITAL SIGNS: ED Triage Vitals [05/05/16 1123]  Enc Vitals Group     BP 113/70     Pulse Rate 90     Resp 20     Temp 98.7 F (37.1 C)     Temp Source Oral     SpO2 96 %     Weight      Height      Head Circumference      Peak Flow      Pain Score      Pain Loc      Pain Edu?      Excl. in [place]?      Constitutional: Alert and oriented. Ill appearing but in no acute distress. Eyes:  Conjunctivae are normal Without icterus, injection or discharge Head: Atraumatic. ENT:      Ears: TMs visualized bilaterally with trace serous effusion but no bulging, erythema or perforation.      Nose: Moderate congestion with moderate clear rhinorrhea.      Mouth/Throat: Mucous membranes are moist. Pharynx without erythema, swelling, exudate. Postnasal drip is clear. Airways patent. Uvula is midline. Neck: No stridor. Supple with full range of motion. Hematological/Lymphatic/Immunilogical: No cervical lymphadenopathy. Cardiovascular: Normal rate, regular rhythm. Normal S1 and S2.  Good peripheral circulation. Respiratory: Normal respiratory effort without tachypnea or retractions. Lungs CTAB with breath sounds noted in all lung fields. No wheeze, rhonchi, rales. Neurologic:  Normal speech and language. No gross focal neurologic deficits  are appreciated.  Skin:  Skin is warm, dry and intact. No rash noted. Normal skin turgor. Psychiatric: Mood and affect are normal. Speech and behavior are normal. Patient exhibits appropriate insight and judgement.   ____________________________________________   LABS  None ____________________________________________  EKG  None ____________________________________________  RADIOLOGY  None ____________________________________________    PROCEDURES  Procedure(s) performed: None   Procedures   Medications - No data to display   ____________________________________________   INITIAL IMPRESSION / ASSESSMENT AND PLAN / ED COURSE  Pertinent labs & imaging results that were available during my care of the patient were reviewed by me and considered in my medical decision making (see chart for details).  Clinical Course     Patient's diagnosis is consistent with Viral URI with cough. Discussed with the patient that symptoms and physical exam is consistent with influenza. Patient is 72 hours into symptoms therefore Tamiflu would more  than likely not be efficacious. Patient is responding well to albuterol nebulized treatments at home and should continue such. Patient also encouraged to continue fluid intake and avoid sugary or caffeinated beverages. Patient will be discharged home with prescriptions for Tessalon Perles, Flonase and promethazine DM to take as directed. May take Tylenol over-the-counter as needed for aches or fever. Patient is to follow up with her primary care provider if symptoms persist past this treatment course. Patient is given ED precautions to return to the ED for any worsening or new symptoms.    ____________________________________________  FINAL CLINICAL IMPRESSION(S) / ED DIAGNOSES  Final diagnoses:  Viral URI with cough      NEW MEDICATIONS STARTED DURING THIS VISIT:  Discharge Medication List as of 05/05/2016 11:39 AM    START taking these medications   Details  benzonatate (TESSALON PERLES) 100 MG capsule Take 1 capsule (100 mg total) by mouth 3 (three) times daily as needed for cough., Starting Thu 05/05/2016, Print    fluticasone (FLONASE) 50 MCG/ACT nasal spray Place 2 sprays into both nostrils daily., Starting Thu 05/05/2016, Print    promethazine-dextromethorphan (PROMETHAZINE-DM) 6.25-15 MG/5ML syrup Take 5 mLs by mouth 4 (four) times daily as needed for cough., Starting Thu 05/05/2016, [place], PA-C 05/05/16 [place], MD 05/05/16 [phone]

## 2016-05-26 ENCOUNTER — Ambulatory Visit: Payer: Medicaid Other | Attending: Primary Care

## 2016-10-23 ENCOUNTER — Encounter: Payer: Self-pay | Admitting: Emergency Medicine

## 2016-10-23 ENCOUNTER — Emergency Department
Admission: EM | Admit: 2016-10-23 | Discharge: 2016-10-23 | Disposition: A | Discharge status: Home / Self Care | Payer: Medicaid Other | Attending: Student in an Organized Health Care Education/Training Program | Admitting: Student in an Organized Health Care Education/Training Program

## 2016-10-23 ENCOUNTER — Emergency Department: Payer: Medicaid Other

## 2016-10-23 DIAGNOSIS — J45909 Unspecified asthma, uncomplicated: Secondary | ICD-10-CM | POA: Diagnosis not present

## 2016-10-23 DIAGNOSIS — I10 Essential (primary) hypertension: Secondary | ICD-10-CM | POA: Insufficient documentation

## 2016-10-23 DIAGNOSIS — Z79899 Other long term (current) drug therapy: Secondary | ICD-10-CM | POA: Diagnosis not present

## 2016-10-23 DIAGNOSIS — Y929 Unspecified place or not applicable: Secondary | ICD-10-CM | POA: Insufficient documentation

## 2016-10-23 DIAGNOSIS — Y939 Activity, unspecified: Secondary | ICD-10-CM | POA: Insufficient documentation

## 2016-10-23 DIAGNOSIS — S59902A Unspecified injury of left elbow, initial encounter: Secondary | ICD-10-CM | POA: Diagnosis present

## 2016-10-23 DIAGNOSIS — E119 Type 2 diabetes mellitus without complications: Secondary | ICD-10-CM | POA: Insufficient documentation

## 2016-10-23 DIAGNOSIS — W2203XA Walked into furniture, initial encounter: Secondary | ICD-10-CM | POA: Insufficient documentation

## 2016-10-23 DIAGNOSIS — F17210 Nicotine dependence, cigarettes, uncomplicated: Secondary | ICD-10-CM | POA: Diagnosis not present

## 2016-10-23 DIAGNOSIS — M25522 Pain in left elbow: Secondary | ICD-10-CM | POA: Diagnosis not present

## 2016-10-23 DIAGNOSIS — Y999 Unspecified external cause status: Secondary | ICD-10-CM | POA: Diagnosis not present

## 2016-10-23 MED ORDER — DICLOFENAC SODIUM 1 % TD GEL: 2.0000 g | Freq: Four times a day (QID) | TRANSDERMAL | 0 refills | Status: DC

## 2016-10-23 NOTE — ED Triage Notes (Signed)
Patient states that she fell 4 days ago and hurt her left elbow. Patient states that the next day she got the same elbow caught in a rail. Patient reports that she continues to have pain to her left elbow.

## 2016-10-23 NOTE — ED Notes (Signed)
Pt back from xray; officers with pt

## 2016-10-23 NOTE — ED Provider Notes (Signed)
[facility] Emergency Department Provider Note    First MD Initiated Contact with Patient 10/23/16 2210     (approximate)  I have reviewed the triage vital signs and the nursing notes.   HISTORY  Chief Complaint Elbow Pain    HPI Gina Costa is a 56 y.o. female who presents with pain to the lateral aspect of her left elbow. States it starts roughly 4 days ago when the patient fell and hit her elbow on the corner of her daughter's bed. States that she then got her arm stuck in between banister which also exacerbated the pain. States that she's had persistent discomfort. No fevers. States that she does have throbbing pain go down her left fingers. No noted swelling. No erythema or warmth.   Past Medical History:  Diagnosis Date  . Anemia    H/O  . Anxiety   . Anxiety and depression   . Arthritis    back  . Asthma   . Back pain   . Bulging disc   . Diabetes mellitus without complication (HCC)    NO MEDS-DIET CONTROL  . Elevated LFTs   . Gallstones 11-2014  . GERD (gastroesophageal reflux disease)   . Pneumonia   . Sprain of ribs    Family History  Problem Relation Age of Onset  . Heart disease Father   . Cancer Maternal Grandmother        breast  . Breast cancer Maternal Grandmother   . Cancer Cousin        breast  . Breast cancer Cousin   . COPD Mother   . Heart disease Mother   . Diabetes Mother   . Hypertension Mother    Past Surgical History:  Procedure Laterality Date  . BREAST CYST ASPIRATION Left 2014   benign  . BREAST CYST ASPIRATION Left 2002  . LIPOMA EXCISION Left 01/21/2015   Procedure: EXCISION SEBACEOUS CYST LEFT CHEST ;  Surgeon: Marlyce Huge, MD;  Location: [facility] ORS;  Service: General;  Laterality: Left;  . TUBAL LIGATION     Patient Active Problem List   Diagnosis Date Noted  . Sebaceous cyst of breast   . Benign essential HTN 01/09/2015  . Cholelithiases 12/13/2014  . Arthralgia of hip 12/03/2014   . Chronic LBP 12/03/2014  . Left leg pain 12/03/2014  . 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 DDD (degenerative disc disease), lumbosacral 09/24/2014  . Lumbar facet arthropathy ([place]) 09/24/2014  . Lumbar radiculopathy 09/24/2014  . 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 MI (mitral incompetence) 07/02/2014  . Diabetes ([place]) 01/06/2014  . 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 Current tobacco use 01/06/2014  . Benign breast cyst in female 11/02/2012      Prior to Admission medications   Medication Sig Start Date End Date Taking? Authorizing Provider  albuterol (PROVENTIL HFA;VENTOLIN HFA) 108 (90 BASE) MCG/ACT inhaler Inhale 2 puffs using inhaler every four to six hours as needed for wheezing, cough or SOB.    [provider]  benzonatate (TESSALON PERLES) 100 MG capsule Take 1 capsule (100 mg total) by mouth 3 (three) times daily as needed for cough. 05/05/16   Hagler, Jami L, PA-C  cyclobenzaprine (FLEXERIL) 10 MG tablet Take by mouth 3 (three) times daily as needed. Reported on 07/02/2015    [provider]  FLUoxetine (PROZAC) 40 MG capsule Take 40 mg by mouth every morning. Reported on 11/12/2015  [provider]  fluticasone (FLONASE) 50 MCG/ACT nasal spray Place 2 sprays into both nostrils daily. 05/05/16   Hagler, Jami L, PA-C  gabapentin (NEURONTIN) 300 MG capsule Limit 1 tab by mouth 2-3 times per day if tolerated 01/05/16   Mohammed Kindle, MD  LORazepam (ATIVAN) 2 MG tablet Take 2 mg by mouth 2 (two) times daily.    [provider]  lurasidone (LATUDA) 40 MG TABS tablet Take 40 mg by mouth at bedtime. Reported on 09/24/2015    [provider]  methocarbamol (ROBAXIN) 500 MG tablet Take 500 mg by mouth every 6 (six) hours as needed for muscle spasms (takes 2 tablets q6hprn).    [provider]  omeprazole (PRILOSEC) 20 MG  capsule Take 20 mg by mouth every morning. Reported on 11/12/2015 08/15/14   [provider]  promethazine-dextromethorphan (PROMETHAZINE-DM) 6.25-15 MG/5ML syrup Take 5 mLs by mouth 4 (four) times daily as needed for cough. 05/05/16   Hagler, Jami L, PA-C  ZOLPIDEM TARTRATE PO Take 10 mg by mouth at bedtime.    [provider]    Allergies Ibuprofen; Lyrica [pregabalin]; Penicillins; and Sulfa antibiotics    Social History Social History  Substance Use Topics  . Smoking status: Current Some Day Smoker    Types: Cigarettes  . Smokeless tobacco: Former Systems developer     Comment: occassional smoker  . Alcohol use No    Review of Systems Patient denies headaches, rhinorrhea, blurry vision, numbness, shortness of breath, chest pain, edema, cough, abdominal pain, nausea, vomiting, diarrhea, dysuria, fevers, rashes or hallucinations unless otherwise stated above in HPI. ____________________________________________   PHYSICAL EXAM:  VITAL SIGNS: Vitals:   10/23/16 2125  BP: (!) 152/90  Pulse: 94  Resp: 18  Temp: 98.6 F (37 C)    Constitutional: Alert and oriented. Well appearing and in no acute distress. Eyes: Conjunctivae are normal.  Head: Atraumatic. Nose: No congestion/rhinnorhea. Mouth/Throat: Mucous membranes are moist.   Neck: Painless ROM.  Cardiovascular:   Good peripheral circulation. Respiratory: Normal respiratory effort.  No retractions.  Gastrointestinal: Soft and nontender.  Musculoskeletal: No lower extremity tenderness .  No joint effusions.  ttp of lateral epicondyle of left arm, no erythema, no effusion, no overlying cellulitis.  Pain worsened with passive flexion at wrist. Neurologic:  Normal speech and language. No gross focal neurologic deficits are appreciated.  Skin:  Skin is warm, dry and intact. No rash noted. Psychiatric: Mood and affect are normal. Speech and behavior are normal.  ____________________________________________    LABS (all labs ordered are listed, but only abnormal results are displayed)  No results found for this or any previous visit (from the past 24 hour(s)). ____________________________________________  ___________________________________  [id number]  I personally reviewed all radiographic images ordered to evaluate for the above acute complaints and reviewed radiology reports and findings.  These findings were personally discussed with the patient.  Please see medical record for radiology report.  ____________________________________________   PROCEDURES  Procedure(s) performed:  Procedures    Critical Care performed: no ____________________________________________   INITIAL IMPRESSION / ASSESSMENT AND PLAN / ED COURSE  Pertinent labs & imaging results that were available during my care of the patient were reviewed by me and considered in my medical decision making (see chart for details).  DDX: contusion, fracture, tendonitis  Gina Costa is a 56 y.o. who presents to the ED with elbow injury that occurred 4 days ago. Denies any other injuries. Denies motor or sensory loss. VSS in ED.  Exam as above. NV intact throughout and distal to injury. Pt able to range joint. No clinical suspicion for infectious process or septic joint. More consistent with tendonitis. XR without fracture.  Discussed supportive care and follow up with pcp.       ____________________________________________   FINAL CLINICAL IMPRESSION(S) / ED DIAGNOSES  Final diagnoses:  Pain in left elbow      NEW MEDICATIONS STARTED DURING THIS VISIT:  New Prescriptions   No medications on file     Note:  This document was prepared using Dragon voice recognition software and may include unintentional dictation errors.     Merlyn Lot, MD 10/23/16 2225

## 2017-03-11 ENCOUNTER — Emergency Department: Payer: Medicaid Other

## 2017-03-11 ENCOUNTER — Encounter: Payer: Self-pay | Admitting: Emergency Medicine

## 2017-03-11 ENCOUNTER — Inpatient Hospital Stay
Admission: EM | Admit: 2017-03-11 | Discharge: 2017-03-13 | DRG: 419 | Disposition: A | Discharge status: Home / Self Care | Payer: Medicaid Other | Attending: Surgery | Admitting: Surgery

## 2017-03-11 DIAGNOSIS — I10 Essential (primary) hypertension: Secondary | ICD-10-CM | POA: Diagnosis present

## 2017-03-11 DIAGNOSIS — R109 Unspecified abdominal pain: Secondary | ICD-10-CM | POA: Diagnosis present

## 2017-03-11 DIAGNOSIS — Z833 Family history of diabetes mellitus: Secondary | ICD-10-CM | POA: Diagnosis not present

## 2017-03-11 DIAGNOSIS — E119 Type 2 diabetes mellitus without complications: Secondary | ICD-10-CM | POA: Diagnosis present

## 2017-03-11 DIAGNOSIS — F17210 Nicotine dependence, cigarettes, uncomplicated: Secondary | ICD-10-CM | POA: Diagnosis present

## 2017-03-11 DIAGNOSIS — K82A1 Gangrene of gallbladder in cholecystitis: Secondary | ICD-10-CM | POA: Diagnosis present

## 2017-03-11 DIAGNOSIS — K219 Gastro-esophageal reflux disease without esophagitis: Secondary | ICD-10-CM | POA: Diagnosis present

## 2017-03-11 DIAGNOSIS — Z8249 Family history of ischemic heart disease and other diseases of the circulatory system: Secondary | ICD-10-CM | POA: Diagnosis not present

## 2017-03-11 DIAGNOSIS — A419 Sepsis, unspecified organism: Secondary | ICD-10-CM

## 2017-03-11 DIAGNOSIS — K8000 Calculus of gallbladder with acute cholecystitis without obstruction: Secondary | ICD-10-CM | POA: Diagnosis present

## 2017-03-11 DIAGNOSIS — Z880 Allergy status to penicillin: Secondary | ICD-10-CM

## 2017-03-11 DIAGNOSIS — Z803 Family history of malignant neoplasm of breast: Secondary | ICD-10-CM | POA: Diagnosis not present

## 2017-03-11 DIAGNOSIS — Z825 Family history of asthma and other chronic lower respiratory diseases: Secondary | ICD-10-CM

## 2017-03-11 DIAGNOSIS — K819 Cholecystitis, unspecified: Secondary | ICD-10-CM | POA: Diagnosis present

## 2017-03-11 LAB — COMPREHENSIVE METABOLIC PANEL
ALBUMIN: 3.8 g/dL (ref 3.5–5.0)
ALK PHOS: 70 U/L (ref 38–126)
ALT: 17 U/L (ref 14–54)
ANION GAP: 14 (ref 5–15)
AST: 20 U/L (ref 15–41)
BILIRUBIN TOTAL: 0.9 mg/dL (ref 0.3–1.2)
BUN: 10 mg/dL (ref 6–20)
CO2: 22 mmol/L (ref 22–32)
CREATININE: 0.96 mg/dL (ref 0.44–1.00)
Calcium: 9.3 mg/dL (ref 8.9–10.3)
Chloride: 97 mmol/L — ABNORMAL LOW (ref 101–111)
GLUCOSE: 117 mg/dL — AB (ref 65–99)
Potassium: 3.5 mmol/L (ref 3.5–5.1)
Sodium: 133 mmol/L — ABNORMAL LOW (ref 135–145)
Total Protein: 7.9 g/dL (ref 6.5–8.1)

## 2017-03-11 LAB — CBC
HEMATOCRIT: 43.3 % (ref 35.0–47.0)
Hemoglobin: 15 g/dL (ref 12.0–16.0)
MCH: 29.3 pg (ref 26.0–34.0)
MCHC: 34.5 g/dL (ref 32.0–36.0)
MCV: 84.7 fL (ref 80.0–100.0)
Platelets: 348 10*3/uL (ref 150–440)
RBC: 5.11 MIL/uL (ref 3.80–5.20)
RDW: 13.7 % (ref 11.5–14.5)
WBC: 18.8 10*3/uL — ABNORMAL HIGH (ref 3.6–11.0)

## 2017-03-11 LAB — LACTIC ACID, PLASMA: Lactic Acid, Venous: 1.2 mmol/L (ref 0.5–1.9)

## 2017-03-11 LAB — MRSA PCR SCREENING: MRSA BY PCR: NEGATIVE

## 2017-03-11 LAB — LIPASE, BLOOD: Lipase: 17 U/L (ref 11–51)

## 2017-03-11 MED ORDER — ONDANSETRON HCL 4 MG/2ML IJ SOLN: 4.0000 mg | Freq: Four times a day (QID) | INTRAMUSCULAR | Status: DC | PRN

## 2017-03-11 MED ORDER — OXYCODONE HCL 5 MG PO TABS: 5.0000 mg | ORAL_TABLET | ORAL | Status: DC | PRN

## 2017-03-11 MED ORDER — LEVOFLOXACIN IN D5W 750 MG/150ML IV SOLN
750.0000 mg | Freq: Once | INTRAVENOUS | Status: AC
  Administered 2017-03-11: 750 mg via INTRAVENOUS
  Filled 2017-03-11: qty 150

## 2017-03-11 MED ORDER — IOPAMIDOL (ISOVUE-300) INJECTION 61%
100.0000 mL | Freq: Once | INTRAVENOUS | Status: AC | PRN
  Administered 2017-03-11: 100 mL via INTRAVENOUS

## 2017-03-11 MED ORDER — ACETAMINOPHEN 500 MG PO TABS
1000.0000 mg | ORAL_TABLET | Freq: Four times a day (QID) | ORAL | Status: DC
  Administered 2017-03-11 – 2017-03-12 (×3): 1000 mg via ORAL
  Filled 2017-03-11 (×6): qty 2

## 2017-03-11 MED ORDER — SODIUM CHLORIDE 0.9 % IV BOLUS (SEPSIS)
1000.0000 mL | Freq: Once | INTRAVENOUS | Status: AC
  Administered 2017-03-11: 1000 mL via INTRAVENOUS

## 2017-03-11 MED ORDER — HYDROMORPHONE HCL 1 MG/ML IJ SOLN
1.0000 mg | INTRAMUSCULAR | Status: DC | PRN
  Administered 2017-03-12: 1 mg via INTRAVENOUS
  Filled 2017-03-11 (×2): qty 1

## 2017-03-11 MED ORDER — ONDANSETRON HCL 4 MG/2ML IJ SOLN
INTRAMUSCULAR | Status: AC
  Administered 2017-03-11: 4 mg via INTRAVENOUS
  Filled 2017-03-11: qty 2

## 2017-03-11 MED ORDER — PANTOPRAZOLE SODIUM 40 MG IV SOLR
40.0000 mg | Freq: Two times a day (BID) | INTRAVENOUS | Status: DC
  Administered 2017-03-11 – 2017-03-13 (×3): 40 mg via INTRAVENOUS
  Filled 2017-03-11 (×3): qty 40

## 2017-03-11 MED ORDER — METRONIDAZOLE IN NACL 5-0.79 MG/ML-% IV SOLN
500.0000 mg | Freq: Once | INTRAVENOUS | Status: AC
  Administered 2017-03-11: 500 mg via INTRAVENOUS
  Filled 2017-03-11: qty 100

## 2017-03-11 MED ORDER — ONDANSETRON 4 MG PO TBDP: 4.0000 mg | ORAL_TABLET | Freq: Four times a day (QID) | ORAL | Status: DC | PRN

## 2017-03-11 MED ORDER — IOPAMIDOL (ISOVUE-300) INJECTION 61%
30.0000 mL | Freq: Once | INTRAVENOUS | Status: AC | PRN
  Administered 2017-03-11: 30 mL via ORAL

## 2017-03-11 MED ORDER — DEXTROSE IN LACTATED RINGERS 5 % IV SOLN
INTRAVENOUS | Status: DC
  Administered 2017-03-11 – 2017-03-12 (×2): via INTRAVENOUS

## 2017-03-11 MED ORDER — MORPHINE SULFATE (PF) 2 MG/ML IV SOLN
2.0000 mg | Freq: Once | INTRAVENOUS | Status: AC
  Administered 2017-03-11: 2 mg via INTRAVENOUS

## 2017-03-11 MED ORDER — METRONIDAZOLE IN NACL 5-0.79 MG/ML-% IV SOLN
500.0000 mg | Freq: Three times a day (TID) | INTRAVENOUS | Status: DC
  Administered 2017-03-11 – 2017-03-13 (×6): 500 mg via INTRAVENOUS
  Filled 2017-03-11 (×7): qty 100

## 2017-03-11 MED ORDER — MORPHINE SULFATE (PF) 2 MG/ML IV SOLN
INTRAVENOUS | Status: AC
  Administered 2017-03-11: 2 mg via INTRAVENOUS
  Filled 2017-03-11: qty 1

## 2017-03-11 MED ORDER — CHLORHEXIDINE GLUCONATE CLOTH 2 % EX PADS: 6.0000 | MEDICATED_PAD | Freq: Every day | CUTANEOUS | Status: DC

## 2017-03-11 MED ORDER — ENOXAPARIN SODIUM 40 MG/0.4ML ~~LOC~~ SOLN
40.0000 mg | SUBCUTANEOUS | Status: DC
  Administered 2017-03-11 – 2017-03-12 (×2): 40 mg via SUBCUTANEOUS
  Filled 2017-03-11 (×2): qty 0.4

## 2017-03-11 MED ORDER — QUETIAPINE FUMARATE 100 MG PO TABS
100.0000 mg | ORAL_TABLET | Freq: Every day | ORAL | Status: DC
  Administered 2017-03-11 – 2017-03-12 (×2): 100 mg via ORAL
  Filled 2017-03-11 (×2): qty 1
  Filled 2017-03-11 (×2): qty 4

## 2017-03-11 MED ORDER — MUPIROCIN 2 % EX OINT
1.0000 "application " | TOPICAL_OINTMENT | Freq: Two times a day (BID) | CUTANEOUS | Status: DC
  Filled 2017-03-11: qty 22

## 2017-03-11 MED ORDER — MORPHINE SULFATE (PF) 4 MG/ML IV SOLN
4.0000 mg | Freq: Once | INTRAVENOUS | Status: AC
  Administered 2017-03-11: 4 mg via INTRAVENOUS
  Filled 2017-03-11: qty 1

## 2017-03-11 MED ORDER — ONDANSETRON HCL 4 MG/2ML IJ SOLN
4.0000 mg | Freq: Once | INTRAMUSCULAR | Status: AC
  Administered 2017-03-11: 4 mg via INTRAVENOUS

## 2017-03-11 MED ORDER — CIPROFLOXACIN IN D5W 400 MG/200ML IV SOLN
400.0000 mg | Freq: Two times a day (BID) | INTRAVENOUS | Status: DC
  Administered 2017-03-11 – 2017-03-13 (×3): 400 mg via INTRAVENOUS
  Filled 2017-03-11 (×5): qty 200

## 2017-03-11 NOTE — ED Triage Notes (Signed)
N/V/D RLQ pain

## 2017-03-11 NOTE — H&P (Signed)
Patient ID: Gina Costa, female   DOB: 1961/01/12, 56 y.o.   MRN: [id number]  HPI Gina Costa is a 56 y.o. female seen in the emergency room for 3-4 day history of abdominal pain located ine right upper quadrant. She reports that the pain is intermittent moderate to severe in intensity and sharp. Some low-grade temperature. Some nausea and vomiting. A has worsened over the last 24 hours and now taking a deep breath makes symptoms worse. No specific aggravating factors. She does have a history of chronic pain anxiety an actively smoking.She is able to perform more than 4 Mets of activity without any shortness of breath or chest pain. CT scan personally  Reviewed, pericholecystic fluid with triple galls and thickening of the gallbladder wall suggestive acute cholecystitis. There is no free air or other acute intra-abdominal pathology. No biliary dilation Her white count is 18,000 with a left shift and her CMP is normal. Hemoglobin and platelets are normal. Lipase is normal   HPI  Past Medical History:  Diagnosis Date  . Anemia    H/O  . Anxiety   . Anxiety and depression   . Arthritis    back  . Asthma   . Back pain   . Bulging disc   . Diabetes mellitus without complication (HCC)    NO MEDS-DIET CONTROL  . Elevated LFTs   . Gallstones 11-2014  . GERD (gastroesophageal reflux disease)   . Pneumonia   . Sprain of ribs     Past Surgical History:  Procedure Laterality Date  . BREAST CYST ASPIRATION Left 2014   benign  . BREAST CYST ASPIRATION Left 2002  . LIPOMA EXCISION Left 01/21/2015   Procedure: EXCISION SEBACEOUS CYST LEFT CHEST ;  Surgeon: Marlyce Huge, MD;  Location: [facility] ORS;  Service: General;  Laterality: Left;  . TUBAL LIGATION      Family History  Problem Relation Age of Onset  . Heart disease Father   . Cancer Maternal Grandmother        breast  . Breast cancer Maternal Grandmother   . Cancer Cousin        breast  . Breast cancer Cousin   . COPD  Mother   . Heart disease Mother   . Diabetes Mother   . Hypertension Mother     Social History Social History  Substance Use Topics  . Smoking status: Current Some Day Smoker    Types: Cigarettes  . Smokeless tobacco: Former Systems developer     Comment: occassional smoker  . Alcohol use No    Allergies  Allergen Reactions  . Ibuprofen Shortness Of Breath    Causes her to wheeze  . Lyrica [Pregabalin] Swelling    THICK TONGUE  . Penicillins Hives    Has patient had a PCN reaction causing immediate rash, facial/tongue/throat swelling, SOB or lightheadedness with hypotension: Yes Has patient had a PCN reaction causing severe rash involving mucus membranes or skin necrosis: No Has patient had a PCN reaction that required hospitalization: No Has patient had a PCN reaction occurring within the last 10 years: No If all of the above answers are "NO", then may proceed with Cephalosporin use.  . Sulfa Antibiotics Swelling    Current Facility-Administered Medications  Medication Dose Route Frequency Provider Last Rate Last Dose  . bupivacaine (PF) (MARCAINE) 0.25 % injection 30 mL  30 mL Other Once Mohammed Kindle, MD      . fentaNYL (SUBLIMAZE) injection 100 mcg  100 mcg Intravenous Once  Mohammed Kindle, MD      . fentaNYL (SUBLIMAZE) injection 100 mcg  100 mcg Intravenous Once Mohammed Kindle, MD      . lactated ringers infusion 1,000 mL  1,000 mL Intravenous Continuous Mohammed Kindle, MD      . lactated ringers infusion 1,000 mL  1,000 mL Intravenous Continuous Mohammed Kindle, MD      . lactated ringers infusion 1,000 mL  1,000 mL Intravenous Continuous Mohammed Kindle, MD      . lactated ringers infusion 1,000 mL  1,000 mL Intravenous Continuous Mohammed Kindle, MD      . lactated ringers infusion 1,000 mL  1,000 mL Intravenous Continuous Mohammed Kindle, MD 125 mL/hr at 01/18/16 1227 1,000 mL at 01/18/16 1227  . lidocaine (PF) (XYLOCAINE) 1 % injection 10 mL  10 mL Subcutaneous Once Mohammed Kindle,  MD      . lidocaine (PF) (XYLOCAINE) 1 % injection 10 mL  10 mL Subcutaneous Once Mohammed Kindle, MD      . midazolam (VERSED) 5 MG/5ML injection 5 mg  5 mg Intravenous Once Mohammed Kindle, MD      . midazolam (VERSED) 5 MG/5ML injection 5 mg  5 mg Intravenous Once Mohammed Kindle, MD      . morphine 4 MG/ML injection 4 mg  4 mg Intravenous Once Delman Kitten, MD      . orphenadrine (NORFLEX) injection 60 mg  60 mg Intramuscular Once Mohammed Kindle, MD      . orphenadrine (NORFLEX) injection 60 mg  60 mg Intramuscular Once Mohammed Kindle, MD      . orphenadrine (NORFLEX) injection 60 mg  60 mg Intramuscular Once Mohammed Kindle, MD      . sodium chloride 0.9 % bolus 1,000 mL  1,000 mL Intravenous Once Belina Mandile, [place] F, MD      . sodium chloride flush (NS) 0.9 % injection 20 mL  20 mL Other Once Mohammed Kindle, MD      . sodium chloride flush (NS) 0.9 % injection 20 mL  20 mL Other Once Mohammed Kindle, MD      . sodium chloride flush (NS) 0.9 % injection 20 mL  20 mL Other Once Mohammed Kindle, MD      . triamcinolone acetonide (KENALOG-40) injection 40 mg  40 mg Other Once Mohammed Kindle, MD      . triamcinolone acetonide (KENALOG-40) injection 40 mg  40 mg Other Once Mohammed Kindle, MD      . triamcinolone acetonide (KENALOG-40) injection 40 mg  40 mg Other Once Mohammed Kindle, MD       Current Outpatient Prescriptions  Medication Sig Dispense Refill  . oxyCODONE (OXY IR/ROXICODONE) 5 MG immediate release tablet Take 1 tablet by mouth every 4 (four) hours as needed.  0  . QUEtiapine (SEROQUEL) 100 MG tablet Take 1 tablet by mouth at bedtime.  1  . QUEtiapine (SEROQUEL) 25 MG tablet Take 25 mg by mouth 2 (two) times daily as needed.  1  . albuterol (PROVENTIL HFA;VENTOLIN HFA) 108 (90 BASE) MCG/ACT inhaler Inhale 2 puffs using inhaler every four to six hours as needed for wheezing, cough or SOB.    Marland Kitchen benzonatate (TESSALON PERLES) 100 MG capsule Take 1 capsule (100 mg total) by mouth 3 (three) times  daily as needed for cough. (Patient not taking: Reported on 03/11/2017) 21 capsule 0  . cyclobenzaprine (FLEXERIL) 10 MG tablet Take by mouth 3 (three) times daily as needed. Reported on 07/02/2015    . diclofenac sodium (  VOLTAREN) 1 % GEL Apply 2 g topically 4 (four) times daily. (Patient not taking: Reported on 03/11/2017) 100 g 0  . fluticasone (FLONASE) 50 MCG/ACT nasal spray Place 2 sprays into both nostrils daily. (Patient not taking: Reported on 03/11/2017) 16 g 0  . gabapentin (NEURONTIN) 300 MG capsule Limit 1 tab by mouth 2-3 times per day if tolerated (Patient not taking: Reported on 03/11/2017) 80 capsule 0  . promethazine-dextromethorphan (PROMETHAZINE-DM) 6.25-15 MG/5ML syrup Take 5 mLs by mouth 4 (four) times daily as needed for cough. (Patient not taking: Reported on 03/11/2017) 118 mL 0     Review of Systems Full ROS  was asked and was negative except for the information on the HPI  Physical Exam Blood pressure 104/63, pulse (!) 108, temperature 99.2 F (37.3 C), temperature source Oral, resp. rate 18, height 5\' 7"  (1.702 m), weight 79.4 kg (175 lb), last menstrual period 01/15/2011, SpO2 95 %. CONSTITUTIONAL: NAD EYES: Pupils are equal, round, and reactive to light, Sclera are non-icteric. EARS, NOSE, MOUTH AND THROAT: The oropharynx is clear. The oral mucosa is pink and moist. Hearing is intact to voice. LYMPH NODES:  Lymph nodes in the neck are normal. RESPIRATORY:  Lungs are clear. There is normal respiratory effort, with equal breath sounds bilaterally, and without pathologic use of accessory muscles. CARDIOVASCULAR: Heart is regular without murmurs, gallops, or rubs. GI: The abdomen is  Soft,TTP RUQ w + Murphy sign. No peritonitis or masses GU: Rectal deferred.   MUSCULOSKELETAL: Normal muscle strength and tone. No cyanosis or edema.   SKIN: Turgor is good and there are no pathologic skin lesions or ulcers. NEUROLOGIC: Motor and sensation is grossly normal. Cranial  nerves are grossly intact. PSYCH:  Oriented to person, place and time. Affect is normal.  Data Reviewed I have personally reviewed the patient's imaging, laboratory findings and medical records.    Assessment/Plan 56 year old female smoker comes in with classic signs and symptoms of acute cholecystitis. Scars with the patient in detail. We will admit, keep her nothing by mouth, I will re-bolused her with normal saline and start broad-spectrum antibiotic in the form of Cipro and Flagyl since she is allergic to penicillin. We will perform lap chole in am. The risks, benefits, complications, treatment options, and expected outcomes were discussed with the patient. The possibilities of bleeding, recurrent infection, finding a normal gallbladder, perforation of viscus organs, damage to surrounding structures, bile leak, abscess formation, needing a drain placed, the need for additional procedures, reaction to medication, pulmonary aspiration,  failure to diagnose a condition, the possible need to convert to an open procedure, and creating a complication requiring transfusion or operation were discussed with the patient. The patient and/or family concurred with the proposed plan, giving informed consent.   Caroleen Hamman, MD FACS General Surgeon 03/11/2017, 6:52 PM

## 2017-03-11 NOTE — ED Provider Notes (Signed)
[facility] Emergency Department Provider Note ____________________________________________   I have reviewed the triage vital signs and the triage nursing note.  HISTORY  Chief Complaint Abdominal Pain; Nausea; Emesis; and Diarrhea   Historian Patient  HPI Gina Costa is a 56 y.o. female presents with several days of feeling unwell, some vomiting, some loose stools, and yesterday had pain across her back down into her left side abdomen and across the right side abdomen.  This morning she states the pain is severe and is looking in the right side abdomen.  That nothing makes it worse or better.  She has had nothing to eat since Thursday when the symptoms began.  Denies fevers at home.   Past Medical History:  Diagnosis Date  . Anemia    H/O  . Anxiety   . Anxiety and depression   . Arthritis    back  . Asthma   . Back pain   . Bulging disc   . Diabetes mellitus without complication (HCC)    NO MEDS-DIET CONTROL  . Elevated LFTs   . Gallstones 11-2014  . GERD (gastroesophageal reflux disease)   . Pneumonia   . Sprain of ribs     Patient Active Problem List   Diagnosis Date Noted  . Sebaceous cyst of breast   . Benign essential HTN 01/09/2015  . Cholelithiases 12/13/2014  . Arthralgia of hip 12/03/2014  . Chronic LBP 12/03/2014  . Left leg pain 12/03/2014  . 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 DDD (degenerative disc disease), lumbosacral 09/24/2014  . Lumbar facet arthropathy 09/24/2014  . Lumbar radiculopathy 09/24/2014  . 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 MI (mitral incompetence) 07/02/2014  . Diabetes ([place]) 01/06/2014  . 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 Current tobacco use 01/06/2014  . Benign breast cyst in female 11/02/2012    Past Surgical History:  Procedure Laterality Date  . BREAST CYST ASPIRATION Left  2014   benign  . BREAST CYST ASPIRATION Left 2002  . LIPOMA EXCISION Left 01/21/2015   Procedure: EXCISION SEBACEOUS CYST LEFT CHEST ;  Surgeon: Marlyce Huge, MD;  Location: [facility] ORS;  Service: General;  Laterality: Left;  . TUBAL LIGATION      Prior to Admission medications   Medication Sig Start Date End Date Taking? Authorizing Provider  albuterol (PROVENTIL HFA;VENTOLIN HFA) 108 (90 BASE) MCG/ACT inhaler Inhale 2 puffs using inhaler every four to six hours as needed for wheezing, cough or SOB.    [provider]  benzonatate (TESSALON PERLES) 100 MG capsule Take 1 capsule (100 mg total) by mouth 3 (three) times daily as needed for cough. 05/05/16   Hagler, Jami L, PA-C  cyclobenzaprine (FLEXERIL) 10 MG tablet Take by mouth 3 (three) times daily as needed. Reported on 07/02/2015    [provider]  diclofenac sodium (VOLTAREN) 1 % GEL Apply 2 g topically 4 (four) times daily. 10/23/16   Merlyn Lot, MD  FLUoxetine (PROZAC) 40 MG capsule Take 40 mg by mouth every morning. Reported on 11/12/2015    [provider]  fluticasone (FLONASE) 50 MCG/ACT nasal spray Place 2 sprays into both nostrils daily. 05/05/16   Hagler, Jami L, PA-C  gabapentin (NEURONTIN) 300 MG capsule Limit 1 tab by mouth 2-3 times per day if tolerated 01/05/16   Mohammed Kindle, MD  LORazepam (ATIVAN) 2 MG tablet Take 2 mg by mouth 2 (two) times daily.  [provider]  lurasidone (LATUDA) 40 MG TABS tablet Take 40 mg by mouth at bedtime. Reported on 09/24/2015    [provider]  methocarbamol (ROBAXIN) 500 MG tablet Take 500 mg by mouth every 6 (six) hours as needed for muscle spasms (takes 2 tablets q6hprn).    [provider]  omeprazole (PRILOSEC) 20 MG capsule Take 20 mg by mouth every morning. Reported on 11/12/2015 08/15/14   [provider]  promethazine-dextromethorphan (PROMETHAZINE-DM) 6.25-15 MG/5ML syrup Take 5 mLs by mouth 4 (four) times daily  as needed for cough. 05/05/16   Hagler, Jami L, PA-C  ZOLPIDEM TARTRATE PO Take 10 mg by mouth at bedtime.    [provider]    Allergies  Allergen Reactions  . Ibuprofen Shortness Of Breath    Causes her to wheeze  . Lyrica [Pregabalin] Swelling    THICK TONGUE  . Penicillins Hives  . Sulfa Antibiotics Swelling    Family History  Problem Relation Age of Onset  . Heart disease Father   . Cancer Maternal Grandmother        breast  . Breast cancer Maternal Grandmother   . Cancer Cousin        breast  . Breast cancer Cousin   . COPD Mother   . Heart disease Mother   . Diabetes Mother   . Hypertension Mother     Social History Social History  Substance Use Topics  . Smoking status: Current Some Day Smoker    Types: Cigarettes  . Smokeless tobacco: Former Systems developer     Comment: occassional smoker  . Alcohol use No    Review of Systems  Constitutional: Negative for fever. Eyes: Negative for visual changes. ENT: Negative for sore throat. Cardiovascular: Negative for chest pain. Respiratory: Negative for shortness of breath. Gastrointestinal: Positive for abdominal pain, vomiting and diarrhea. Genitourinary: Negative for dysuria. Musculoskeletal: No current back pain, had some back pain yesterday. Skin: Negative for rash. Neurological: Negative for headache.  ____________________________________________   PHYSICAL EXAM:  VITAL SIGNS: ED Triage Vitals  Enc Vitals Group     BP 03/11/17 1410 98/61     Pulse Rate 03/11/17 1410 (!) 142     Resp 03/11/17 1410 20     Temp 03/11/17 1410 99.2 F (37.3 C)     Temp Source 03/11/17 1410 Oral     SpO2 03/11/17 1410 96 %     Weight 03/11/17 1416 175 lb (79.4 kg)     Height 03/11/17 1416 5\' 7"  (1.702 m)     Head Circumference --      Peak Flow --      Pain Score 03/11/17 1440 10     Pain Loc --      Pain Edu? --      Excl. in [place]? --      Constitutional: Alert and oriented. Well appearing and in no  distress. HEENT   Head: Normocephalic and atraumatic.      Eyes: Conjunctivae are normal. Pupils equal and round.       Ears:         Nose: No congestion/rhinnorhea.   Mouth/Throat: Mucous membranes are moist.   Neck: No stridor. Cardiovascular/Chest: Tachycardic rate, regular rhythm.  No murmurs, rubs, or gallops. Respiratory: Normal respiratory effort without tachypnea nor retractions. Breath sounds are clear and equal bilaterally. No wheezes/rales/rhonchi. Gastrointestinal: Soft.  Moderate tenderness in the right abdomen and right lower quadrant with mild guarding.  No rebound. Genitourinary/rectal:Deferred Musculoskeletal: Nontender with  normal range of motion in all extremities. No joint effusions.  No lower extremity tenderness.  No edema. Neurologic:  Normal speech and language. No gross or focal neurologic deficits are appreciated. Skin:  Skin is warm, dry and intact. No rash noted. Psychiatric: Mood and affect are normal. Speech and behavior are normal. Patient exhibits appropriate insight and judgment.   ____________________________________________  LABS (pertinent positives/negatives) I, Lisa Roca, MD the attending physician have reviewed the labs noted below.  Labs Reviewed  COMPREHENSIVE METABOLIC PANEL - Abnormal; Notable for the following:       Result Value   Sodium 133 (*)    Chloride 97 (*)    Glucose, Bld 117 (*)    All other components within normal limits  CBC - Abnormal; Notable for the following:    WBC 18.8 (*)    All other components within normal limits  CULTURE, BLOOD (ROUTINE X 2)  CULTURE, BLOOD (ROUTINE X 2)  LIPASE, BLOOD  URINALYSIS, COMPLETE (UACMP) WITH MICROSCOPIC    ____________________________________________    EKG I, Lisa Roca, MD, the attending physician have personally viewed and interpreted all ECGs.  142 bpm.  Sinus tachycardia.  Narrow QRS.  Normal axis.  Nonspecific ST and T  wave ____________________________________________  RADIOLOGY All Xrays were viewed by me.  Imaging interpreted by Radiologist, and I, Lisa Roca, MD the attending physician have reviewed the radiologist interpretation noted below.  CT abdomen pelvis with contrast: Pending __________________________________________  PROCEDURES  Procedure(s) performed: None  Critical Care performed: None  ____________________________________________  Current Facility-Administered Medications on File Prior to Encounter  Medication Dose Route Frequency Provider Last Rate Last Dose  . bupivacaine (PF) (MARCAINE) 0.25 % injection 30 mL  30 mL Other Once Mohammed Kindle, MD      . fentaNYL (SUBLIMAZE) injection 100 mcg  100 mcg Intravenous Once Mohammed Kindle, MD      . fentaNYL (SUBLIMAZE) injection 100 mcg  100 mcg Intravenous Once Mohammed Kindle, MD      . lactated ringers infusion 1,000 mL  1,000 mL Intravenous Continuous Mohammed Kindle, MD      . lactated ringers infusion 1,000 mL  1,000 mL Intravenous Continuous Mohammed Kindle, MD      . lactated ringers infusion 1,000 mL  1,000 mL Intravenous Continuous Mohammed Kindle, MD      . lactated ringers infusion 1,000 mL  1,000 mL Intravenous Continuous Mohammed Kindle, MD      . lactated ringers infusion 1,000 mL  1,000 mL Intravenous Continuous Mohammed Kindle, MD 125 mL/hr at 01/18/16 1227 1,000 mL at 01/18/16 1227  . lidocaine (PF) (XYLOCAINE) 1 % injection 10 mL  10 mL Subcutaneous Once Mohammed Kindle, MD      . lidocaine (PF) (XYLOCAINE) 1 % injection 10 mL  10 mL Subcutaneous Once Mohammed Kindle, MD      . midazolam (VERSED) 5 MG/5ML injection 5 mg  5 mg Intravenous Once Mohammed Kindle, MD      . midazolam (VERSED) 5 MG/5ML injection 5 mg  5 mg Intravenous Once Mohammed Kindle, MD      . orphenadrine (NORFLEX) injection 60 mg  60 mg Intramuscular Once Mohammed Kindle, MD      . orphenadrine (NORFLEX) injection 60 mg  60 mg Intramuscular Once Mohammed Kindle, MD      . orphenadrine (NORFLEX) injection 60 mg  60 mg Intramuscular Once Mohammed Kindle, MD      . sodium chloride flush (NS) 0.9 % injection 20 mL  20  mL Other Once Mohammed Kindle, MD      . sodium chloride flush (NS) 0.9 % injection 20 mL  20 mL Other Once Mohammed Kindle, MD      . sodium chloride flush (NS) 0.9 % injection 20 mL  20 mL Other Once Mohammed Kindle, MD      . triamcinolone acetonide (KENALOG-40) injection 40 mg  40 mg Other Once Mohammed Kindle, MD      . triamcinolone acetonide (KENALOG-40) injection 40 mg  40 mg Other Once Mohammed Kindle, MD      . triamcinolone acetonide (KENALOG-40) injection 40 mg  40 mg Other Once Mohammed Kindle, MD       Current Outpatient Prescriptions on File Prior to Encounter  Medication Sig Dispense Refill  . albuterol (PROVENTIL HFA;VENTOLIN HFA) 108 (90 BASE) MCG/ACT inhaler Inhale 2 puffs using inhaler every four to six hours as needed for wheezing, cough or SOB.    Marland Kitchen benzonatate (TESSALON PERLES) 100 MG capsule Take 1 capsule (100 mg total) by mouth 3 (three) times daily as needed for cough. 21 capsule 0  . cyclobenzaprine (FLEXERIL) 10 MG tablet Take by mouth 3 (three) times daily as needed. Reported on 07/02/2015    . diclofenac sodium (VOLTAREN) 1 % GEL Apply 2 g topically 4 (four) times daily. 100 g 0  . FLUoxetine (PROZAC) 40 MG capsule Take 40 mg by mouth every morning. Reported on 11/12/2015    . fluticasone (FLONASE) 50 MCG/ACT nasal spray Place 2 sprays into both nostrils daily. 16 g 0  . gabapentin (NEURONTIN) 300 MG capsule Limit 1 tab by mouth 2-3 times per day if tolerated 80 capsule 0  . LORazepam (ATIVAN) 2 MG tablet Take 2 mg by mouth 2 (two) times daily.    Marland Kitchen lurasidone (LATUDA) 40 MG TABS tablet Take 40 mg by mouth at bedtime. Reported on 09/24/2015    . methocarbamol (ROBAXIN) 500 MG tablet Take 500 mg by mouth every 6 (six) hours as needed for muscle spasms (takes 2 tablets q6hprn).    Marland Kitchen omeprazole (PRILOSEC) 20 MG capsule  Take 20 mg by mouth every morning. Reported on 11/12/2015  5  . promethazine-dextromethorphan (PROMETHAZINE-DM) 6.25-15 MG/5ML syrup Take 5 mLs by mouth 4 (four) times daily as needed for cough. 118 mL 0  . ZOLPIDEM TARTRATE PO Take 10 mg by mouth at bedtime.      ____________________________________________  ED COURSE / ASSESSMENT AND PLAN  Pertinent labs & imaging results that were available during my care of the patient were reviewed by me and considered in my medical decision making (see chart for details).    Ms. Johnsie Cancel is having significant moderate to severe tenderness in the right side of her abdomen likely more so lower than upper.  She is a low-grade temperature and she is tachycardic.  She states that she has not really been eating and drinking so she may be dehydrated.  Laboratory studies show elevated white blood cell count, normal LFTs and lipase, I am going to pursue CT imaging of the abdomen.  Given elevated white blood cell count, tachycardia, low-grade temperature, I am going to place her on the sepsis pathway for likely intra-abdominal source.    Patient care transferred to Dr. Georgiana Spinner at shift change 3:30 PM.  Disposition likely medical admission versus surgical admission.  Urinalysis is pending.  Blood cultures have been sent and are pending.  CT scan is pending.  DIFFERENTIAL DIAGNOSIS: Differential diagnosis includes, but is not limited to, ovarian  cyst, ovarian torsion, acute appendicitis, diverticulitis, urinary tract infection/pyelonephritis, endometriosis, bowel obstruction, colitis, renal colic, gastroenteritis, hernia, pregnancy related pain including ectopic pregnancy, etc.    Patient / Family / Caregiver informed of clinical course, medical decision-making process, and agree with plan. ___________________________________________   FINAL CLINICAL IMPRESSION(S) / ED DIAGNOSES   Final diagnoses:  Right lateral abdominal pain               Note: This dictation was prepared with Dragon dictation. Any transcriptional errors that result from this process are unintentional    Lisa Roca, MD 03/11/17 1542

## 2017-03-11 NOTE — ED Provider Notes (Signed)
Consult called to Dr. Dahlia Byes, he will be seeing the patient shortly for concerns of possible acute cholecystitis  CT reviewed, noted lung nodule but mostly notable for possible acute cholecystitis.  Discussed with patient and she is understanding, she will await consultation with general surgery.  Her pain is starting to come back and I have ordered additional morphine.  She and her sister at the bedside both understanding.  She is awake and alert, slightly tachycardic but otherwise stable vital signs in no distress.  Reports pain in the right mid abdomen radiating to the back  Admit to general surgery   Delman Kitten, MD 03/11/17 [phone]

## 2017-03-12 ENCOUNTER — Inpatient Hospital Stay: Payer: Medicaid Other | Admitting: Anesthesiology

## 2017-03-12 ENCOUNTER — Encounter
Admission: EM | Disposition: A | Discharge status: Home / Self Care | Payer: Self-pay | Source: Home / Self Care | Attending: Surgery

## 2017-03-12 HISTORY — PX: CHOLECYSTECTOMY: SHX55

## 2017-03-12 LAB — COMPREHENSIVE METABOLIC PANEL
ALBUMIN: 2.9 g/dL — AB (ref 3.5–5.0)
ALK PHOS: 63 U/L (ref 38–126)
ALT: 15 U/L (ref 14–54)
AST: 18 U/L (ref 15–41)
Anion gap: 6 (ref 5–15)
BILIRUBIN TOTAL: 0.9 mg/dL (ref 0.3–1.2)
BUN: 8 mg/dL (ref 6–20)
CHLORIDE: 107 mmol/L (ref 101–111)
CO2: 23 mmol/L (ref 22–32)
Calcium: 8.5 mg/dL — ABNORMAL LOW (ref 8.9–10.3)
Creatinine, Ser: 0.8 mg/dL (ref 0.44–1.00)
GFR calc Af Amer: >60 mL/min (ref >60)
GFR calc non Af Amer: >60 mL/min (ref >60)
GLUCOSE: 114 mg/dL — AB (ref 65–99)
POTASSIUM: 3.4 mmol/L — AB (ref 3.5–5.1)
Sodium: 136 mmol/L (ref 135–145)
Total Protein: 6 g/dL — ABNORMAL LOW (ref 6.5–8.1)

## 2017-03-12 SURGERY — LAPAROSCOPIC CHOLECYSTECTOMY
Anesthesia: General

## 2017-03-12 MED ORDER — FENTANYL CITRATE (PF) 250 MCG/5ML IJ SOLN
INTRAMUSCULAR | Status: AC
  Filled 2017-03-12: qty 5

## 2017-03-12 MED ORDER — GLYCOPYRROLATE 0.2 MG/ML IJ SOLN
INTRAMUSCULAR | Status: DC | PRN
  Administered 2017-03-12: 0.2 mg via INTRAVENOUS

## 2017-03-12 MED ORDER — ACETAMINOPHEN 10 MG/ML IV SOLN
INTRAVENOUS | Status: DC | PRN
  Administered 2017-03-12: 1000 mg via INTRAVENOUS

## 2017-03-12 MED ORDER — SUGAMMADEX SODIUM 200 MG/2ML IV SOLN
INTRAVENOUS | Status: DC | PRN
  Administered 2017-03-12: 160 mg via INTRAVENOUS

## 2017-03-12 MED ORDER — ACETAMINOPHEN 10 MG/ML IV SOLN
INTRAVENOUS | Status: AC
  Filled 2017-03-12: qty 100

## 2017-03-12 MED ORDER — PROPOFOL 10 MG/ML IV BOLUS
INTRAVENOUS | Status: AC
  Filled 2017-03-12: qty 20

## 2017-03-12 MED ORDER — PROPOFOL 10 MG/ML IV BOLUS
INTRAVENOUS | Status: DC | PRN
  Administered 2017-03-12: 100 mg via INTRAVENOUS

## 2017-03-12 MED ORDER — ROCURONIUM BROMIDE 50 MG/5ML IV SOLN
INTRAVENOUS | Status: AC
  Filled 2017-03-12: qty 1

## 2017-03-12 MED ORDER — LIDOCAINE HCL (PF) 2 % IJ SOLN
INTRAMUSCULAR | Status: AC
  Filled 2017-03-12: qty 10

## 2017-03-12 MED ORDER — DEXAMETHASONE SODIUM PHOSPHATE 10 MG/ML IJ SOLN
INTRAMUSCULAR | Status: DC | PRN
  Administered 2017-03-12: 5 mg via INTRAVENOUS

## 2017-03-12 MED ORDER — LIDOCAINE HCL (CARDIAC) 20 MG/ML IV SOLN
INTRAVENOUS | Status: DC | PRN
  Administered 2017-03-12: 50 mg via INTRAVENOUS

## 2017-03-12 MED ORDER — BUPIVACAINE-EPINEPHRINE 0.25% -1:200000 IJ SOLN
INTRAMUSCULAR | Status: DC | PRN
  Administered 2017-03-12: 30 mL

## 2017-03-12 MED ORDER — OXYCODONE-ACETAMINOPHEN 7.5-325 MG PO TABS
2.0000 | ORAL_TABLET | ORAL | Status: DC | PRN
  Administered 2017-03-12: 2 via ORAL
  Administered 2017-03-13: 1 via ORAL
  Filled 2017-03-12 (×3): qty 2

## 2017-03-12 MED ORDER — FENTANYL CITRATE (PF) 100 MCG/2ML IJ SOLN
INTRAMUSCULAR | Status: AC
  Filled 2017-03-12: qty 2

## 2017-03-12 MED ORDER — DEXAMETHASONE SODIUM PHOSPHATE 10 MG/ML IJ SOLN
INTRAMUSCULAR | Status: AC
  Filled 2017-03-12: qty 1

## 2017-03-12 MED ORDER — MIDAZOLAM HCL 2 MG/2ML IJ SOLN
INTRAMUSCULAR | Status: AC
  Filled 2017-03-12: qty 2

## 2017-03-12 MED ORDER — GLYCOPYRROLATE 0.2 MG/ML IJ SOLN
INTRAMUSCULAR | Status: AC
  Filled 2017-03-12: qty 1

## 2017-03-12 MED ORDER — MIDAZOLAM HCL 2 MG/2ML IJ SOLN
INTRAMUSCULAR | Status: DC | PRN
  Administered 2017-03-12: 2 mg via INTRAVENOUS

## 2017-03-12 MED ORDER — BUPIVACAINE-EPINEPHRINE (PF) 0.25% -1:200000 IJ SOLN
INTRAMUSCULAR | Status: AC
  Filled 2017-03-12: qty 30

## 2017-03-12 MED ORDER — FENTANYL CITRATE (PF) 100 MCG/2ML IJ SOLN
INTRAMUSCULAR | Status: DC | PRN
  Administered 2017-03-12: 50 ug via INTRAVENOUS
  Administered 2017-03-12: 100 ug via INTRAVENOUS
  Administered 2017-03-12 (×2): 50 ug via INTRAVENOUS

## 2017-03-12 MED ORDER — GABAPENTIN 800 MG PO TABS
400.0000 mg | ORAL_TABLET | Freq: Three times a day (TID) | ORAL | Status: DC
  Filled 2017-03-12: qty 0.5

## 2017-03-12 MED ORDER — SUCCINYLCHOLINE CHLORIDE 20 MG/ML IJ SOLN
INTRAMUSCULAR | Status: DC | PRN
  Administered 2017-03-12: 100 mg via INTRAVENOUS

## 2017-03-12 MED ORDER — LACTATED RINGERS IV SOLN
INTRAVENOUS | Status: DC | PRN
  Administered 2017-03-12: 08:00:00 via INTRAVENOUS

## 2017-03-12 MED ORDER — PHENYLEPHRINE HCL 10 MG/ML IJ SOLN
INTRAMUSCULAR | Status: AC
  Filled 2017-03-12: qty 1

## 2017-03-12 MED ORDER — SUGAMMADEX SODIUM 200 MG/2ML IV SOLN
INTRAVENOUS | Status: AC
  Filled 2017-03-12: qty 2

## 2017-03-12 MED ORDER — ONDANSETRON HCL 4 MG/2ML IJ SOLN
INTRAMUSCULAR | Status: AC
  Filled 2017-03-12: qty 2

## 2017-03-12 MED ORDER — ONDANSETRON HCL 4 MG/2ML IJ SOLN
INTRAMUSCULAR | Status: DC | PRN
  Administered 2017-03-12: 4 mg via INTRAVENOUS

## 2017-03-12 MED ORDER — ONDANSETRON HCL 4 MG/2ML IJ SOLN: 4.0000 mg | Freq: Once | INTRAMUSCULAR | Status: DC | PRN

## 2017-03-12 MED ORDER — GABAPENTIN 400 MG PO CAPS
400.0000 mg | ORAL_CAPSULE | Freq: Three times a day (TID) | ORAL | Status: DC
  Filled 2017-03-12 (×2): qty 1

## 2017-03-12 MED ORDER — ROCURONIUM BROMIDE 100 MG/10ML IV SOLN
INTRAVENOUS | Status: DC | PRN
  Administered 2017-03-12 (×2): 25 mg via INTRAVENOUS

## 2017-03-12 MED ORDER — FENTANYL CITRATE (PF) 100 MCG/2ML IJ SOLN
25.0000 ug | INTRAMUSCULAR | Status: DC | PRN
  Administered 2017-03-12 (×4): 25 ug via INTRAVENOUS

## 2017-03-12 MED ORDER — EPHEDRINE SULFATE 50 MG/ML IJ SOLN
INTRAMUSCULAR | Status: AC
  Filled 2017-03-12: qty 1

## 2017-03-12 MED ORDER — SUCCINYLCHOLINE CHLORIDE 20 MG/ML IJ SOLN
INTRAMUSCULAR | Status: AC
  Filled 2017-03-12: qty 1

## 2017-03-12 SURGICAL SUPPLY — 47 items
APPLICATOR COTTON TIP 6IN STRL (MISCELLANEOUS) IMPLANT
APPLIER CLIP 5 13 M/L LIGAMAX5 (MISCELLANEOUS) ×2
BLADE SURG 15 STRL LF DISP TIS (BLADE) ×1 IMPLANT
BLADE SURG 15 STRL SS (BLADE) ×1
BULB RESERV EVAC DRAIN JP 100C (MISCELLANEOUS) ×2 IMPLANT
CANISTER SUCT 1200ML W/VALVE (MISCELLANEOUS) ×2 IMPLANT
CHLORAPREP W/TINT 26ML (MISCELLANEOUS) ×2 IMPLANT
CHOLANGIOGRAM CATH TAUT (CATHETERS) IMPLANT
CLEANER CAUTERY TIP 5X5 PAD (MISCELLANEOUS) IMPLANT
CLIP APPLIE 5 13 M/L LIGAMAX5 (MISCELLANEOUS) ×1 IMPLANT
DECANTER SPIKE VIAL GLASS SM (MISCELLANEOUS) IMPLANT
DERMABOND ADVANCED (GAUZE/BANDAGES/DRESSINGS) ×1
DERMABOND ADVANCED .7 DNX12 (GAUZE/BANDAGES/DRESSINGS) ×1 IMPLANT
DRAIN CHANNEL JP 19F (MISCELLANEOUS) ×2 IMPLANT
DRAPE C-ARM XRAY 36X54 (DRAPES) IMPLANT
ELECT CAUTERY BLADE 6.4 (BLADE) ×2 IMPLANT
ELECT REM PT RETURN 9FT ADLT (ELECTROSURGICAL) ×2
ELECTRODE REM PT RTRN 9FT ADLT (ELECTROSURGICAL) ×1 IMPLANT
GLOVE BIO SURGEON STRL SZ7 (GLOVE) ×6 IMPLANT
GOWN STRL REUS W/ TWL LRG LVL3 (GOWN DISPOSABLE) ×2 IMPLANT
GOWN STRL REUS W/TWL LRG LVL3 (GOWN DISPOSABLE) ×2
IRRIGATION STRYKERFLOW (MISCELLANEOUS) ×1 IMPLANT
IRRIGATOR STRYKERFLOW (MISCELLANEOUS) ×2
IV CATH ANGIO 12GX3 LT BLUE (NEEDLE) ×2 IMPLANT
IV NS 1000ML (IV SOLUTION) ×1
IV NS 1000ML BAXH (IV SOLUTION) ×1 IMPLANT
L-HOOK LAP DISP 36CM (ELECTROSURGICAL) ×2
LHOOK LAP DISP 36CM (ELECTROSURGICAL) ×1 IMPLANT
NEEDLE HYPO 22GX1.5 SAFETY (NEEDLE) ×2 IMPLANT
PACK LAP CHOLECYSTECTOMY (MISCELLANEOUS) ×2 IMPLANT
PAD CLEANER CAUTERY TIP 5X5 (MISCELLANEOUS)
PENCIL ELECTRO HAND CTR (MISCELLANEOUS) IMPLANT
POUCH SPECIMEN RETRIEVAL 10MM (ENDOMECHANICALS) ×2 IMPLANT
SCISSORS METZENBAUM CVD 33 (INSTRUMENTS) ×2 IMPLANT
SLEEVE ENDOPATH XCEL 5M (ENDOMECHANICALS) ×4 IMPLANT
SOL ANTI-FOG 6CC FOG-OUT (MISCELLANEOUS) IMPLANT
SOL FOG-OUT ANTI-FOG 6CC (MISCELLANEOUS)
SPONGE LAP 18X18 5 PK (GAUZE/BANDAGES/DRESSINGS) ×2 IMPLANT
STOPCOCK 4 WAY LG BORE MALE ST (IV SETS) IMPLANT
SUT ETHIBOND 0 MO6 C/R (SUTURE) IMPLANT
SUT MNCRL AB 4-0 PS2 18 (SUTURE) ×2 IMPLANT
SUT VICRYL 0 AB UR-6 (SUTURE) ×4 IMPLANT
SYR 20CC LL (SYRINGE) ×2 IMPLANT
TROCAR XCEL BLUNT TIP 100MML (ENDOMECHANICALS) ×2 IMPLANT
TROCAR XCEL NON-BLD 5MMX100MML (ENDOMECHANICALS) ×2 IMPLANT
TUBING INSUFFLATOR HI FLOW (MISCELLANEOUS) ×2 IMPLANT
WATER STERILE IRR 1000ML POUR (IV SOLUTION) IMPLANT

## 2017-03-12 NOTE — Transfer of Care (Signed)
Immediate Anesthesia Transfer of Care Note  Patient: Gina Costa  Procedure(s) Performed: LAPAROSCOPIC CHOLECYSTECTOMY (N/A )  Patient Location: PACU  Anesthesia Type:General  Level of Consciousness: awake and alert   Airway & Oxygen Therapy: Patient Spontanous Breathing and Patient connected to face mask oxygen  Post-op Assessment: Report given to RN and Post -op Vital signs reviewed and stable  Post vital signs: Reviewed  Last Vitals:  Vitals:   03/12/17 0535 03/12/17 0947  BP: (!) 106/54 121/67  Pulse: 97 96  Resp: 20 (!) 21  Temp: 37.1 C   SpO2: 93% 99%    Last Pain:  Vitals:   03/12/17 0715  TempSrc:   PainSc: 4          Complications: No apparent anesthesia complications

## 2017-03-12 NOTE — Progress Notes (Signed)
Preoperative Review   Patient is met in the preoperative holding area. The history is reviewed in the chart and with the patient. I personally reviewed the options and rationale as well as the risks of this procedure that have been previously discussed with the patient. All questions asked by the patient and/or family were answered to their satisfaction.  Patient agrees to proceed with this procedure at this time.  Cherisse Carrell M.D. FACS   

## 2017-03-12 NOTE — Anesthesia Post-op Follow-up Note (Signed)
Anesthesia QCDR form completed.        

## 2017-03-12 NOTE — Anesthesia Postprocedure Evaluation (Signed)
Anesthesia Post Note  Patient: Ahmaya I Lever  Procedure(s) Performed: LAPAROSCOPIC CHOLECYSTECTOMY (N/A )  Patient location during evaluation: PACU Anesthesia Type: General Level of consciousness: awake and alert Pain management: pain level controlled Vital Signs Assessment: post-procedure vital signs reviewed and stable Respiratory status: spontaneous breathing, nonlabored ventilation, respiratory function stable and patient connected to nasal cannula oxygen Cardiovascular status: blood pressure returned to baseline and stable Postop Assessment: no apparent nausea or vomiting Anesthetic complications: no     Last Vitals:  Vitals:   03/12/17 1133 03/12/17 1231  BP: 110/62 109/66  Pulse: 86 87  Resp:    Temp: 36.9 C 36.7 C  SpO2: 92% 93%    Last Pain:  Vitals:   03/12/17 1341  TempSrc:   PainSc: [place]

## 2017-03-12 NOTE — Anesthesia Procedure Notes (Signed)
Procedure Name: Intubation Performed by: Rolla Plate Pre-anesthesia Checklist: Patient identified, Patient being monitored, Timeout performed, Emergency Drugs available and Suction available Patient Re-evaluated:Patient Re-evaluated prior to induction Oxygen Delivery Method: Circle system utilized Preoxygenation: Pre-oxygenation with 100% oxygen Induction Type: IV induction and Rapid sequence Laryngoscope Size: Miller and 2 Grade View: Grade I Tube type: Oral Tube size: 7.0 mm Number of attempts: 1 Placement Confirmation: ETT inserted through vocal cords under direct vision,  positive ETCO2 and breath sounds checked- equal and bilateral Secured at: 21 cm Tube secured with: Tape Dental Injury: Teeth and Oropharynx as per pre-operative assessment

## 2017-03-12 NOTE — Anesthesia Preprocedure Evaluation (Addendum)
Anesthesia Evaluation  Patient identified by MRN, date of birth, ID band Patient awake    Reviewed: Allergy & Precautions, NPO status , Patient's Chart, lab work & pertinent test results, reviewed documented beta blocker date and time   Airway Mallampati: II  TM Distance: >3 FB     Dental  (+) Chipped, Upper Dentures, Lower Dentures   Pulmonary asthma , pneumonia, resolved, Current Smoker,           Cardiovascular hypertension, Pt. on medications      Neuro/Psych Anxiety  Neuromuscular disease    GI/Hepatic GERD  Controlled,  Endo/Other  diabetes, Type 2  Renal/GU      Musculoskeletal  (+) Arthritis ,   Abdominal   Peds  Hematology  (+) anemia ,   Anesthesia Other Findings   Reproductive/Obstetrics                            Anesthesia Physical Anesthesia Plan  ASA: II  Anesthesia Plan: General   Post-op Pain Management:    Induction: Intravenous  PONV Risk Score and Plan:   Airway Management Planned: Oral ETT  Additional Equipment:   Intra-op Plan:   Post-operative Plan:   Informed Consent: I have reviewed the patients History and Physical, chart, labs and discussed the procedure including the risks, benefits and alternatives for the proposed anesthesia with the patient or authorized representative who has indicated his/her understanding and acceptance.     Plan Discussed with: CRNA  Anesthesia Plan Comments:         Anesthesia Quick Evaluation

## 2017-03-12 NOTE — Op Note (Signed)
Laparoscopic Cholecystectomy  Pre-operative Diagnosis: Acute cholecystitis  Post-operative Diagnosis: same  Procedure: laparoscopic cholecystectomy  Surgeon: Caroleen Hamman, MD FACS  Anesthesia: Gen. with endotracheal tube  Findings: gangrenous Cholecystitis   Estimated Blood Loss: 50cc         Drains: 19 FR RUQ         Specimens: Gallbladder           Complications: none   Procedure Details  The patient was seen again in the Holding Room. The benefits, complications, treatment options, and expected outcomes were discussed with the patient. The risks of bleeding, infection, recurrence of symptoms, failure to resolve symptoms, bile duct damage, bile duct leak, retained common bile duct stone, bowel injury, any of which could require further surgery and/or ERCP, stent, or papillotomy were reviewed with the patient. The likelihood of improving the patient's symptoms with return to their baseline status is good.  The patient and/or family concurred with the proposed plan, giving informed consent.  The patient was taken to Operating Room, identified as [place] and the procedure verified as Laparoscopic Cholecystectomy.  A Time Out was held and the above information confirmed.  Prior to the induction of general anesthesia, antibiotic prophylaxis was administered. VTE prophylaxis was in place. General endotracheal anesthesia was then administered and tolerated well. After the induction, the abdomen was prepped with Chloraprep and draped in the sterile fashion. The patient was positioned in the supine position.  Cut down technique was used to enter the abdominal cavity and a Hasson trochar was placed after two vicryl stitches were anchored to the fascia. Pneumoperitoneum was then created with CO2 and tolerated well without any adverse changes in the patient's vital signs.  Three 5-mm ports were placed in the right upper quadrant all under direct vision. All skin incisions  were  infiltrated with a local anesthetic agent before making the incision and placing the trocars.   The patient was positioned  in reverse Trendelenburg, tilted slightly to the patient's left.  The gallbladder was identified, the fundus grasped and retracted cephalad. Adhesions were lysed bluntly.  GB was decompressed with suction device. Significant inflammatory response due to gangrenous GB. The infundibulum was grasped and retracted laterally, exposing the peritoneum overlying the triangle of Calot. This was then divided and exposed in a blunt fashion. An extended critical view of the cystic duct and cystic artery was obtained.  The cystic duct was clearly identified and bluntly dissected.   Artery and duct were double clipped and divided.  The gallbladder was taken from the gallbladder fossa in a retrograde fashion with the electrocautery. The gallbladder was removed and placed in an Endocatch bag. The liver bed was irrigated and inspected. Hemostasis was achieved with the electrocautery. Copious irrigation was utilized and was repeatedly aspirated until clear.  The gallbladder and Endocatch sac were then removed through a port site.  RUQ blake drain placed under direct visualization/  Inspection of the right upper quadrant was performed. No bleeding, bile duct injury or leak, or bowel injury was noted. Pneumoperitoneum was released.  The periumbilical port site was closed with interrumpted 0 Vicryl sutures. 4-0 subcuticular Monocryl was used to close the skin. Dermabond was  applied.  The patient was then extubated and brought to the recovery room in stable condition. Sponge, lap, and needle counts were correct at closure and at the conclusion of the case.               Caroleen Hamman, MD, FACS

## 2017-03-13 ENCOUNTER — Encounter: Payer: Self-pay | Admitting: Surgery

## 2017-03-13 LAB — CBC
HCT: 32.1 % — ABNORMAL LOW (ref 35.0–47.0)
Hemoglobin: 10.7 g/dL — ABNORMAL LOW (ref 12.0–16.0)
MCH: 28.7 pg (ref 26.0–34.0)
MCHC: 33.3 g/dL (ref 32.0–36.0)
MCV: 86.2 fL (ref 80.0–100.0)
PLATELETS: 256 10*3/uL (ref 150–440)
RBC: 3.72 MIL/uL — AB (ref 3.80–5.20)
RDW: 13.8 % (ref 11.5–14.5)
WBC: 12.8 10*3/uL — ABNORMAL HIGH (ref 3.6–11.0)

## 2017-03-13 LAB — COMPREHENSIVE METABOLIC PANEL
ALT: 36 U/L (ref 14–54)
ANION GAP: 7 (ref 5–15)
AST: 37 U/L (ref 15–41)
Albumin: 2.7 g/dL — ABNORMAL LOW (ref 3.5–5.0)
Alkaline Phosphatase: 58 U/L (ref 38–126)
BUN: 7 mg/dL (ref 6–20)
CHLORIDE: 108 mmol/L (ref 101–111)
CO2: 25 mmol/L (ref 22–32)
CREATININE: 0.7 mg/dL (ref 0.44–1.00)
Calcium: 8.4 mg/dL — ABNORMAL LOW (ref 8.9–10.3)
Glucose, Bld: 128 mg/dL — ABNORMAL HIGH (ref 65–99)
Potassium: 3.4 mmol/L — ABNORMAL LOW (ref 3.5–5.1)
SODIUM: 140 mmol/L (ref 135–145)
Total Bilirubin: 0.3 mg/dL (ref 0.3–1.2)
Total Protein: 5.6 g/dL — ABNORMAL LOW (ref 6.5–8.1)

## 2017-03-13 MED ORDER — METRONIDAZOLE 500 MG PO TABS: 500.0000 mg | ORAL_TABLET | Freq: Three times a day (TID) | ORAL | 0 refills | Status: AC

## 2017-03-13 MED ORDER — OXYCODONE-ACETAMINOPHEN 5-325 MG PO TABS: 1.0000 | ORAL_TABLET | ORAL | 0 refills | Status: DC | PRN

## 2017-03-13 MED ORDER — OXYCODONE-ACETAMINOPHEN 5-325 MG PO TABS
1.0000 | ORAL_TABLET | ORAL | Status: DC | PRN
  Administered 2017-03-13: 1 via ORAL
  Filled 2017-03-13: qty 2

## 2017-03-13 MED ORDER — CIPROFLOXACIN HCL 500 MG PO TABS: 500.0000 mg | ORAL_TABLET | Freq: Two times a day (BID) | ORAL | 0 refills | Status: AC

## 2017-03-13 NOTE — Discharge Summary (Signed)
Physician Discharge Summary  Patient ID: GRACYNN RAJEWSKI MRN: [id number] DOB/AGE: 01/27/1961 56 y.o.  Admit date: 03/11/2017 Discharge date: 03/13/2017  Admission Diagnoses:  Discharge Diagnoses:  Active Problems:   Cholecystitis   Discharged Condition: good  Hospital Course: 56 y.o. female presented to [facility] ED for abdominal pain. Workup was found to be significant for CT imaging demonstrating severe calculous cholecystitis. Informed consent was obtained and documented, and patient underwent laparoscopic cholecystectomy with drain placement and intraoperative findings of gangrenous cholecystitis ([place], 03/12/2017).  Post-operatively, patient's pain improved/resolved and advancement of patient's diet and ambulation were well-tolerated. The remainder of patient's hospital course was essentially unremarkable, and discharge planning was initiated accordingly with patient safely able to be discharged home with appropriate discharge instructions, antibiotics, pain control, and outpatient surgical follow-up after all of her and family's questions were answered to their expressed satisfaction.  Consults: None  Significant Diagnostic Studies: radiology: CT scan: severe calculous cholecystitis  Treatments: IV hydration, antibiotics: Cipro and metronidazole and surgery: laparoscopic cholecystectomy with drain placement  Discharge Exam: Blood pressure (!) 99/46, pulse 72, temperature 97.7 F (36.5 C), temperature source Oral, resp. rate 16, height 5\' 7"  (1.702 m), weight 176 lb 4.8 oz (80 kg), last menstrual period 01/15/2011, SpO2 92 %. General appearance: alert, cooperative and no distress GI: abdomen soft and non-distended with minimal peri-incisional tenderness to palpation, drain with serosanguinous fluid in bulb, no peri-incisional erythema or drainage  Disposition: 01-Home or Self Care   Allergies as of 03/13/2017      Reactions   Ibuprofen Shortness Of Breath   Causes her to wheeze    Lyrica [pregabalin] Swelling   THICK TONGUE   Penicillins Hives   Has patient had a PCN reaction causing immediate rash, facial/tongue/throat swelling, SOB or lightheadedness with hypotension: Yes Has patient had a PCN reaction causing severe rash involving mucus membranes or skin necrosis: No Has patient had a PCN reaction that required hospitalization: No Has patient had a PCN reaction occurring within the last 10 years: No If all of the above answers are "NO", then may proceed with Cephalosporin use.   Sulfa Antibiotics Swelling      Medication List    STOP taking these medications   oxyCODONE 5 MG immediate release tablet Commonly known as:  Oxy IR/ROXICODONE     TAKE these medications   albuterol 108 (90 Base) MCG/ACT inhaler Commonly known as:  PROVENTIL HFA;VENTOLIN HFA Inhale 2 puffs using inhaler every four to six hours as needed for wheezing, cough or SOB.   benzonatate 100 MG capsule Commonly known as:  TESSALON PERLES Take 1 capsule (100 mg total) by mouth 3 (three) times daily as needed for cough.   ciprofloxacin 500 MG tablet Commonly known as:  CIPRO Take 1 tablet (500 mg total) by mouth 2 (two) times daily.   cyclobenzaprine 10 MG tablet Commonly known as:  FLEXERIL Take by mouth 3 (three) times daily as needed. Reported on 07/02/2015   diclofenac sodium 1 % Gel Commonly known as:  VOLTAREN Apply 2 g topically 4 (four) times daily.   fluticasone 50 MCG/ACT nasal spray Commonly known as:  FLONASE Place 2 sprays into both nostrils daily.   gabapentin 300 MG capsule Commonly known as:  NEURONTIN Limit 1 tab by mouth 2-3 times per day if tolerated   metroNIDAZOLE 500 MG tablet Commonly known as:  FLAGYL Take 1 tablet (500 mg total) by mouth 3 (three) times daily.   oxyCODONE-acetaminophen 5-325 MG tablet Commonly known as:  PERCOCET/ROXICET Take 1-2 tablets by mouth every 4 (four) hours as needed for severe pain.   promethazine-dextromethorphan  6.25-15 MG/5ML syrup Commonly known as:  PROMETHAZINE-DM Take 5 mLs by mouth 4 (four) times daily as needed for cough.   QUEtiapine 100 MG tablet Commonly known as:  SEROQUEL Take 1 tablet by mouth at bedtime.   QUEtiapine 25 MG tablet Commonly known as:  SEROQUEL Take 25 mg by mouth 2 (two) times daily as needed.      Follow-up Information    Jules Husbands, MD. Go on 03/27/2017.   Specialty:  General Surgery Why:  Monday at 9:45am for hospital follow-up Contact information: [employer] [place] [id number] [phone]           Signed: Vickie Epley 03/13/2017, 12:19 PM

## 2017-03-13 NOTE — Discharge Instructions (Signed)
In addition to included general post-operative instructions for Laparoscopic Cholecystectomy with placement of drain for Gangrenous Cholecystitis,  Diet: Resume home heart healthy diet as discussed.   Activity: No heavy lifting >15-20 pounds (children, pets, laundry, garbage) or strenuous activity until follow-up, but light activity and walking are encouraged. Do not drive or drink alcohol if taking narcotic pain medications.  Wound care: Drain care as per nursing instructions provided. Keep log of drain volumes. 2 days after surgery (Tuesday, 10/23), may shower/get incision wet with soapy water and pat dry (do not rub incisions), but no baths or submerging incision underwater until follow-up.   Medications: Resume all home medications AND complete course of prescribed antibiotics even if feeling better/well. For mild to moderate pain: acetaminophen (Tylenol) or ibuprofen (if no kidney disease). Combining Tylenol with alcohol can substantially increase your risk of causing liver disease. Narcotic pain medications, if prescribed, can be used for severe pain, though may cause nausea, constipation, and drowsiness. Do not combine Tylenol and Percocet within a 6 hour period as Percocet contains Tylenol. If you do not need the narcotic pain medication, you do not need to fill the prescription.  Call office [phone]) at any time if any questions, worsening pain, fevers/chills, bleeding, drainage from incision site, or other concerns.

## 2017-03-13 NOTE — Progress Notes (Signed)
Gina Costa  A and O x 4. VSS. Pt tolerating diet well. No complaints of pain or nausea. IV removed intact, prescriptions given. Pt voiced understanding of discharge instructions with no further questions. Pt discharged via wheelchair with axillary.  Lynann Bologna MSN, RN-BC  Allergies as of 03/13/2017      Reactions   Ibuprofen Shortness Of Breath   Causes her to wheeze   Lyrica [pregabalin] Swelling   THICK TONGUE   Penicillins Hives   Has patient had a PCN reaction causing immediate rash, facial/tongue/throat swelling, SOB or lightheadedness with hypotension: Yes Has patient had a PCN reaction causing severe rash involving mucus membranes or skin necrosis: No Has patient had a PCN reaction that required hospitalization: No Has patient had a PCN reaction occurring within the last 10 years: No If all of the above answers are "NO", then may proceed with Cephalosporin use.   Sulfa Antibiotics Swelling      Medication List    STOP taking these medications   oxyCODONE 5 MG immediate release tablet Commonly known as:  Oxy IR/ROXICODONE     TAKE these medications   albuterol 108 (90 Base) MCG/ACT inhaler Commonly known as:  PROVENTIL HFA;VENTOLIN HFA Inhale 2 puffs using inhaler every four to six hours as needed for wheezing, cough or SOB.   benzonatate 100 MG capsule Commonly known as:  TESSALON PERLES Take 1 capsule (100 mg total) by mouth 3 (three) times daily as needed for cough.   ciprofloxacin 500 MG tablet Commonly known as:  CIPRO Take 1 tablet (500 mg total) by mouth 2 (two) times daily.   cyclobenzaprine 10 MG tablet Commonly known as:  FLEXERIL Take by mouth 3 (three) times daily as needed. Reported on 07/02/2015   diclofenac sodium 1 % Gel Commonly known as:  VOLTAREN Apply 2 g topically 4 (four) times daily.   fluticasone 50 MCG/ACT nasal spray Commonly known as:  FLONASE Place 2 sprays into both nostrils daily.   gabapentin 300 MG capsule Commonly known  as:  NEURONTIN Limit 1 tab by mouth 2-3 times per day if tolerated   metroNIDAZOLE 500 MG tablet Commonly known as:  FLAGYL Take 1 tablet (500 mg total) by mouth 3 (three) times daily.   oxyCODONE-acetaminophen 5-325 MG tablet Commonly known as:  PERCOCET/ROXICET Take 1-2 tablets by mouth every 4 (four) hours as needed for severe pain.   promethazine-dextromethorphan 6.25-15 MG/5ML syrup Commonly known as:  PROMETHAZINE-DM Take 5 mLs by mouth 4 (four) times daily as needed for cough.   QUEtiapine 100 MG tablet Commonly known as:  SEROQUEL Take 1 tablet by mouth at bedtime.   QUEtiapine 25 MG tablet Commonly known as:  SEROQUEL Take 25 mg by mouth 2 (two) times daily as needed.       Vitals:   03/13/17 1016 03/13/17 1233  BP:  (!) 101/58  Pulse:  86  Resp:    Temp: 97.7 F (36.5 C) 98.3 F (36.8 C)  SpO2:  98%

## 2017-03-14 ENCOUNTER — Telehealth: Payer: Self-pay

## 2017-03-14 LAB — SURGICAL PATHOLOGY

## 2017-03-14 NOTE — Telephone Encounter (Signed)
Post-op call made to patient at this time. Spoke with Patient. Post-op interview questions below.  1. How are you feeling? Sore, but otherwise ok.  2. Is your pain controlled? No, Pharmacy could not fill this because she had a prescription through the pain clinic.   3. What are you doing for the pain? Nothing.  4. Are you having any Nausea or Vomiting? Slight Nausea  5. Are you having any Fever or Chills? No  6. Are you having any Constipation or Diarrhea? Diarrhea  7. Is there any Swelling or Bruising you are concerned about? No  8. Do you have any questions or concerns at this time? No   Discussion: 34ml serosanginous drainage from JP the last time emptied. Going to pharmacy today to get prescriptions for pain figured out. Still on antibiotics- causing some diarrhea. Encouraged patient to stay away from greasy and gas producing foods for several weeks until off antibiotics and diarrhea gets better. Reviewed post-op appointment information with patient. She is encouraged to call with any questions or concerns prior to this appointment.

## 2017-03-16 LAB — CULTURE, BLOOD (ROUTINE X 2)
CULTURE: NO GROWTH
Culture: NO GROWTH

## 2017-03-27 ENCOUNTER — Encounter: Payer: Self-pay | Admitting: Surgery

## 2017-03-27 ENCOUNTER — Ambulatory Visit (INDEPENDENT_AMBULATORY_CARE_PROVIDER_SITE_OTHER): Payer: Medicaid Other | Admitting: Surgery

## 2017-03-27 VITALS — BP 128/84 | HR 90 | Temp 98.9°F | Ht 67.0 in | Wt 173.4 lb

## 2017-03-27 DIAGNOSIS — Z09 Encounter for follow-up examination after completed treatment for conditions other than malignant neoplasm: Secondary | ICD-10-CM

## 2017-03-27 NOTE — Progress Notes (Signed)
S/p lap chole w drain placement. Gang Cholecystitis Path d/w pt Less 10cc from JP /d + PO, no fevers, minimal abd discomfort  PE NAD Abd: soft, incisions c/d/i, drain removed  A/P Doing well No complications No heavy lifting RTC prn

## 2017-03-27 NOTE — Patient Instructions (Signed)
We have removed the drain today.  You will need to keep a dressing over this area as long as there is drainage from this area.   GENERAL POST-OPERATIVE PATIENT INSTRUCTIONS   WOUND CARE INSTRUCTIONS:  Keep a dry clean dressing on the wound if there is drainage. The initial bandage may be removed after 24 hours.  Once the wound has quit draining you may leave it open to air.  If clothing rubs against the wound or causes irritation and the wound is not draining you may cover it with a dry dressing during the daytime.  Try to keep the wound dry and avoid ointments on the wound unless directed to do so.  If the wound becomes bright red and painful or starts to drain infected material that is not clear, please contact your physician immediately.  If the wound is mildly pink and has a thick firm ridge underneath it, this is normal, and is referred to as a healing ridge.  This will resolve over the next 4-6 weeks.  BATHING: You may shower if you have been informed of this by your surgeon. However, Please do not submerge in a tub, hot tub, or pool until incisions are completely sealed or have been told by your surgeon that you may do so.  DIET:  You may eat any foods that you can tolerate.  It is a good idea to eat a high fiber diet and take in plenty of fluids to prevent constipation.  If you do become constipated you may want to take a mild laxative or take ducolax tablets on a daily basis until your bowel habits are regular.  Constipation can be very uncomfortable, along with straining, after recent surgery.  ACTIVITY:  You are encouraged to cough and deep breath or use your incentive spirometer if you were given one, every 15-30 minutes when awake.  This will help prevent respiratory complications and low grade fevers post-operatively if you had a general anesthetic.  You may want to hug a pillow when coughing and sneezing to add additional support to the surgical area, if you had abdominal or chest  surgery, which will decrease pain during these times.  You are encouraged to walk and engage in light activity for the next two weeks.  You should not lift more than 20 pounds, until 05/24/2017 as it could put you at increased risk for complications.  Twenty pounds is roughly equivalent to a plastic bag of groceries. At that time- Listen to your body when lifting, if you have pain when lifting, stop and then try again in a few days. Soreness after doing exercises or activities of daily living is normal as you get back in to your normal routine.  MEDICATIONS:  Try to take narcotic medications and anti-inflammatory medications, such as tylenol, ibuprofen, naprosyn, etc., with food.  This will minimize stomach upset from the medication.  Should you develop nausea and vomiting from the pain medication, or develop a rash, please discontinue the medication and contact your physician.  You should not drive, make important decisions, or operate machinery when taking narcotic pain medication.  SUNBLOCK Use sun block to incision area over the next year if this area will be exposed to sun. This helps decrease scarring and will allow you avoid a permanent darkened area over your incision.  QUESTIONS:  Please feel free to call our office if you have any questions, and we will be glad to assist you. [phone]

## 2017-06-24 IMAGING — CR DG ELBOW COMPLETE 3+V*L*
5 series · 5 of 5 positions shown · non-contrast
Comparison: None.

CLINICAL DATA: Fall several days ago with persistent elbow pain,
initial encounter

EXAM:
LEFT ELBOW - COMPLETE 3+ VIEW

[elbow ap]
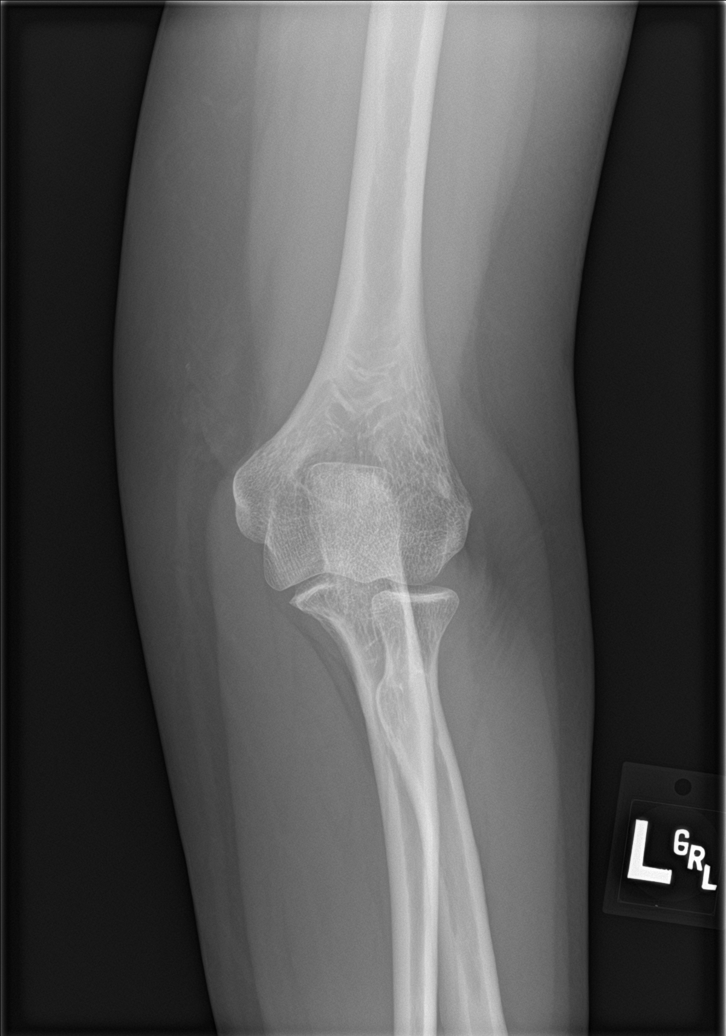

[elbow obl (1 of 3)]
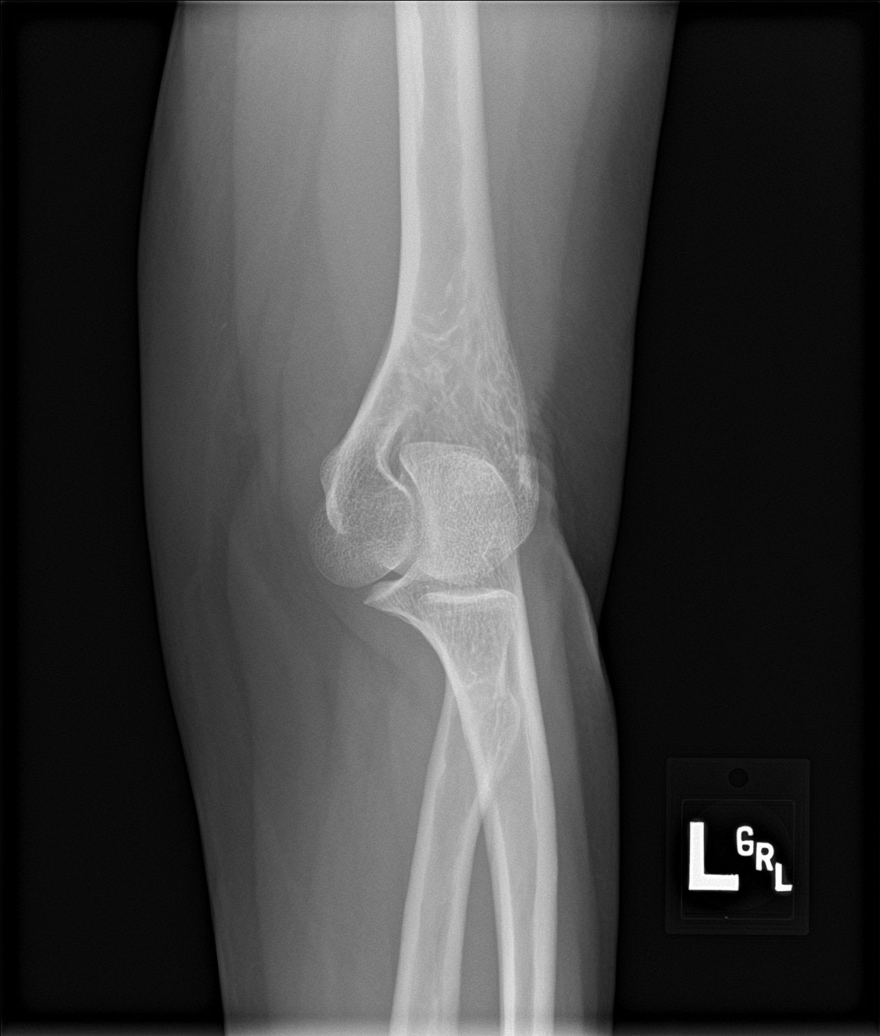

[elbow obl (2 of 3)]
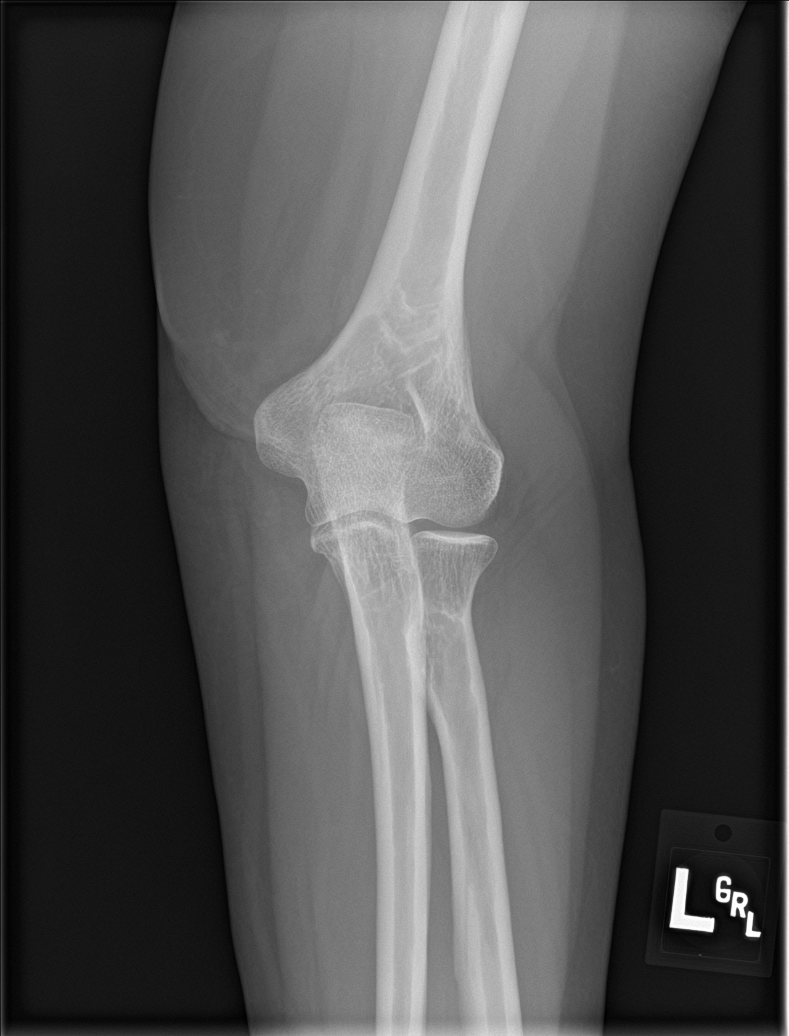

[elbow lat]
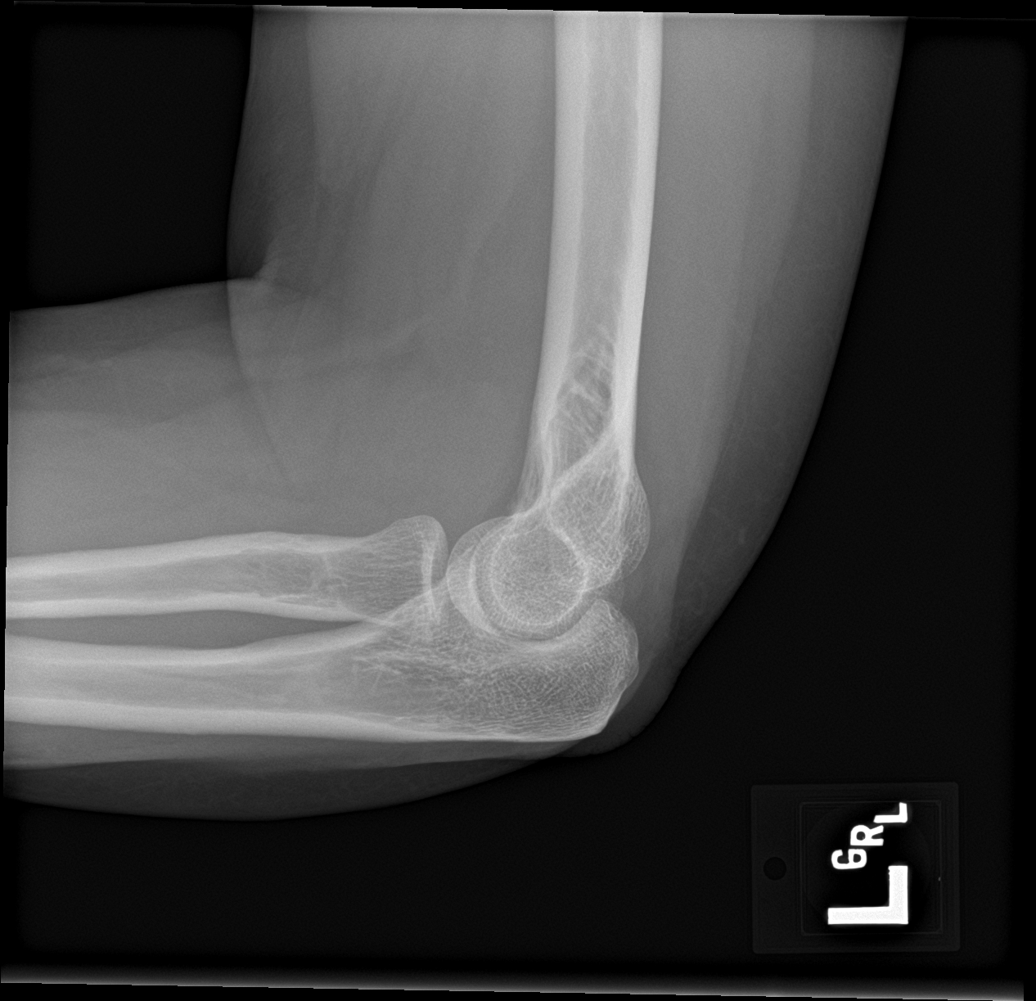

[elbow obl (3 of 3)]
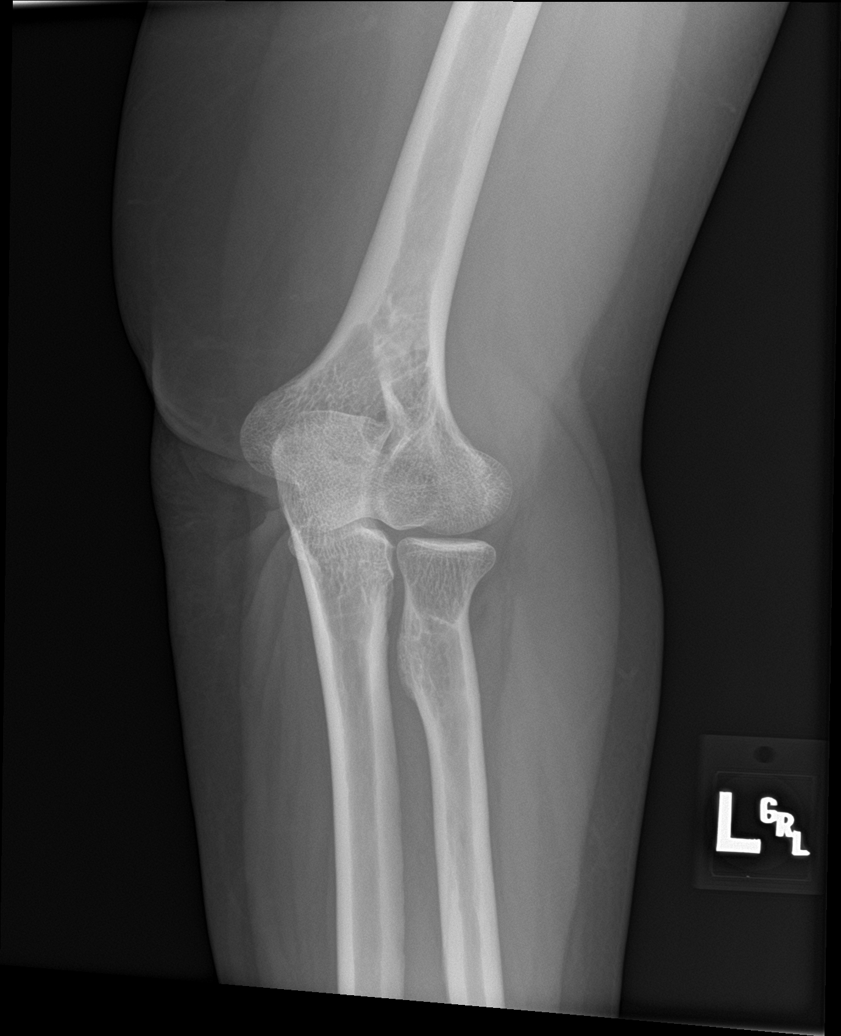

[5 of 5 positions shown; findings below may reference images not displayed]

FINDINGS: There is no evidence of fracture, dislocation, or joint effusion.
There is no evidence of arthropathy or other focal bone abnormality.
Soft tissues are unremarkable.
IMPRESSION: No acute abnormality noted.

## 2017-09-15 LAB — HIV ANTIBODY (ROUTINE TESTING W REFLEX): HIV Screen 4th Generation wRfx: NONREACTIVE

## 2017-11-15 ENCOUNTER — Other Ambulatory Visit: Payer: Self-pay | Admitting: Primary Care

## 2017-11-15 DIAGNOSIS — Z1231 Encounter for screening mammogram for malignant neoplasm of breast: Secondary | ICD-10-CM

## 2017-12-07 ENCOUNTER — Ambulatory Visit
Admission: RE | Admit: 2017-12-07 | Discharge: 2017-12-07 | Disposition: A | Discharge status: Home / Self Care | Payer: Medicaid Other | Source: Ambulatory Visit | Attending: Primary Care | Admitting: Primary Care

## 2017-12-07 DIAGNOSIS — Z1231 Encounter for screening mammogram for malignant neoplasm of breast: Secondary | ICD-10-CM | POA: Insufficient documentation

## 2018-12-07 ENCOUNTER — Emergency Department: Payer: Medicaid Other

## 2018-12-07 ENCOUNTER — Other Ambulatory Visit: Payer: Self-pay

## 2018-12-07 DIAGNOSIS — M25532 Pain in left wrist: Secondary | ICD-10-CM | POA: Diagnosis present

## 2018-12-07 DIAGNOSIS — Z5321 Procedure and treatment not carried out due to patient leaving prior to being seen by health care provider: Secondary | ICD-10-CM | POA: Insufficient documentation

## 2018-12-07 NOTE — ED Triage Notes (Signed)
Pt states left wrist pain for a month. Pt states area is painful and to touch. Pt denies known injury. No swelling or deformity noted. Pt appears in no acute distress. States she cannot move fingers due to pain.

## 2018-12-08 ENCOUNTER — Emergency Department
Admission: EM | Admit: 2018-12-08 | Discharge: 2018-12-08 | Disposition: A | Discharge status: Home / Self Care | Payer: Medicaid Other | Attending: Emergency Medicine | Admitting: Emergency Medicine

## 2021-01-26 ENCOUNTER — Other Ambulatory Visit: Payer: Self-pay | Admitting: Primary Care

## 2021-01-26 DIAGNOSIS — Z1231 Encounter for screening mammogram for malignant neoplasm of breast: Secondary | ICD-10-CM

## 2021-11-08 ENCOUNTER — Ambulatory Visit
Admission: RE | Admit: 2021-11-08 | Discharge: 2021-11-08 | Disposition: A | Discharge status: Home / Self Care | Payer: Medicaid Other | Source: Ambulatory Visit | Attending: Primary Care | Admitting: Primary Care

## 2021-11-08 DIAGNOSIS — Z1231 Encounter for screening mammogram for malignant neoplasm of breast: Secondary | ICD-10-CM | POA: Diagnosis not present

## 2022-10-04 ENCOUNTER — Other Ambulatory Visit: Payer: Self-pay | Admitting: Primary Care

## 2022-10-04 DIAGNOSIS — Z1231 Encounter for screening mammogram for malignant neoplasm of breast: Secondary | ICD-10-CM

## 2022-11-01 ENCOUNTER — Ambulatory Visit
Admission: RE | Admit: 2022-11-01 | Discharge: 2022-11-01 | Disposition: A | Discharge status: Home / Self Care | Payer: Medicaid Other | Source: Ambulatory Visit | Attending: Primary Care | Admitting: Primary Care

## 2022-11-01 DIAGNOSIS — Z1231 Encounter for screening mammogram for malignant neoplasm of breast: Secondary | ICD-10-CM | POA: Diagnosis present

## 2023-04-28 ENCOUNTER — Emergency Department (HOSPITAL_COMMUNITY): Payer: Medicaid Other

## 2023-04-28 ENCOUNTER — Other Ambulatory Visit: Payer: Self-pay

## 2023-04-28 ENCOUNTER — Emergency Department (HOSPITAL_COMMUNITY)
Admission: EM | Admit: 2023-04-28 | Discharge: 2023-04-29 | Disposition: A | Discharge status: Home / Self Care | Payer: Medicaid Other | Attending: Emergency Medicine | Admitting: Emergency Medicine

## 2023-04-28 ENCOUNTER — Encounter (HOSPITAL_COMMUNITY): Payer: Self-pay

## 2023-04-28 DIAGNOSIS — W19XXXA Unspecified fall, initial encounter: Secondary | ICD-10-CM

## 2023-04-28 DIAGNOSIS — S4992XA Unspecified injury of left shoulder and upper arm, initial encounter: Secondary | ICD-10-CM | POA: Diagnosis present

## 2023-04-28 DIAGNOSIS — E119 Type 2 diabetes mellitus without complications: Secondary | ICD-10-CM | POA: Insufficient documentation

## 2023-04-28 DIAGNOSIS — S40812A Abrasion of left upper arm, initial encounter: Secondary | ICD-10-CM | POA: Insufficient documentation

## 2023-04-28 DIAGNOSIS — W06XXXA Fall from bed, initial encounter: Secondary | ICD-10-CM | POA: Diagnosis not present

## 2023-04-28 NOTE — ED Triage Notes (Signed)
C/o fall 2 months ago landing on left arm. Pain to left upper arm, worse with movement.  Denies swelling.

## 2023-04-29 MED ORDER — KETOROLAC TROMETHAMINE 30 MG/ML IJ SOLN
30.0000 mg | Freq: Once | INTRAMUSCULAR | Status: AC
  Administered 2023-04-29: 30 mg via INTRAMUSCULAR
  Filled 2023-04-29: qty 1

## 2023-04-29 NOTE — Discharge Instructions (Addendum)
Continue your oxycodone. Follow-up with your doctor. Return here for new concerns.

## 2023-04-29 NOTE — ED Provider Notes (Signed)
[place] EMERGENCY DEPARTMENT AT [facility] Provider Note   CSN: [id number] Arrival date & time: 04/28/23  2044     History  Chief Complaint  Patient presents with   Arm Injury    Gina Costa is a 62 y.o. female.  The history is provided by the patient and medical records.  Arm Injury  62 year old female with history of chronic pain, diabetes, spinal stenosis, presenting to the ED with left upper arm pain.  Patient reports she accidentally rolled out of her bed and struck her arm on bedside stepstool about 2 months ago.  She reports ongoing, nagging pain in her left upper arm.  She is able to carry out her usual daily activities, but has increased pain after use of the arm.  She is right-hand dominant.  She denies any numbness or weakness.  She takes chronic oxycodone but has not had any relief from her with this.  Home Medications Prior to Admission medications   Medication Sig Start Date End Date Taking? Authorizing Provider  oxyCODONE-acetaminophen (PERCOCET/ROXICET) 5-325 MG tablet Take 1 tablet every 4 (four) hours as needed by mouth for severe pain.    [provider]  QUEtiapine (SEROQUEL) 25 MG tablet Take 25 mg by mouth 2 (two) times daily as needed. 02/13/17   [provider]      Allergies    Ibuprofen, Penicillins, Pregabalin, and Sulfa antibiotics    Review of Systems   Review of Systems  Musculoskeletal:  Positive for arthralgias.  All other systems reviewed and are negative.   Physical Exam Updated Vital Signs BP (!) 141/76 (BP Location: Right Arm)   Pulse 90   Temp 97.7 F (36.5 C) (Oral)   Resp 17   Wt 71 kg   LMP 01/15/2011   SpO2 94%   BMI 26.05 kg/m   Physical Exam Vitals and nursing note reviewed.  Constitutional:      Appearance: She is well-developed.  HENT:     Head: Normocephalic and atraumatic.  Eyes:     Conjunctiva/sclera: Conjunctivae normal.     Pupils: Pupils are equal, round, and reactive to  light.  Cardiovascular:     Rate and Rhythm: Normal rate and regular rhythm.     Heart sounds: Normal heart sounds.  Pulmonary:     Effort: Pulmonary effort is normal. No respiratory distress.     Breath sounds: Normal breath sounds. No rhonchi.  Abdominal:     General: Bowel sounds are normal.     Palpations: Abdomen is soft.  Musculoskeletal:        General: Normal range of motion.     Cervical back: Normal range of motion.     Comments: Abrasions to left upper posterior arm that appear old/scabbed, there is bruising/swelling, no acute bony deformities, normal ROM of shoulder, elbow, wrist, and fingers; normal cap refill, normal grip strength, radial pulse intact  Skin:    General: Skin is warm and dry.  Neurological:     Mental Status: She is alert and oriented to person, place, and time.     ED Results / Procedures / Treatments   Labs (all labs ordered are listed, but only abnormal results are displayed) Labs Reviewed - No data to display  EKG None  Radiology DG Humerus Left  Result Date: 04/28/2023 CLINICAL DATA:  Fall with pain in the upper arm EXAM: LEFT HUMERUS - 2+ VIEW COMPARISON:  None Available. FINDINGS: There is no evidence of fracture or other focal  bone lesions. Soft tissues are unremarkable. IMPRESSION: Negative. Electronically Signed   By: Jasmine Pang M.D.   On: 04/28/2023 21:16    Procedures Procedures    Medications Ordered in ED Medications  ketorolac (TORADOL) 30 MG/ML injection 30 mg (30 mg Intramuscular Given 04/29/23 0044)    ED Course/ Medical Decision Making/ A&P                                 Medical Decision Making Amount and/or Complexity of Data Reviewed Radiology: ordered and independent interpretation performed.  Risk Prescription drug management.   62 year old female here with left upper arm pain after rolling out of bed and striking on a stool 2 months ago.  She reports ongoing, throbbing pain.  She denies any new injury  since that time.  Able to do usual activities, but painful afterwards.  She has some minor abrasions to posterior left upper arm without acute bruising, bleeding, or bony deformity.  She has normal range of motion of the shoulder, elbow, wrist, and fingers.  Normal grip strength.  Normal radial pulse and cap refill.  X-rays negative for any acute bony findings.  Patient is already on home oxycodone, can continue this.  Recommended she follow-up with her primary care doctor if symptoms persist.  Return here for new concerns.  Final Clinical Impression(s) / ED Diagnoses Final diagnoses:  Fall, initial encounter  Injury of left upper extremity, initial encounter    Rx / DC Orders ED Discharge Orders     None         Garlon Hatchet, PA-C 04/29/23 0205    Zadie Rhine, MD 04/29/23 [phone]

## 2023-10-12 ENCOUNTER — Other Ambulatory Visit: Payer: Self-pay | Admitting: Internal Medicine

## 2023-10-12 DIAGNOSIS — M7989 Other specified soft tissue disorders: Secondary | ICD-10-CM

## 2023-10-12 DIAGNOSIS — M79661 Pain in right lower leg: Secondary | ICD-10-CM

## 2023-10-13 ENCOUNTER — Ambulatory Visit
Admission: RE | Admit: 2023-10-13 | Discharge: 2023-10-13 | Disposition: A | Discharge status: Home / Self Care | Source: Ambulatory Visit | Attending: Internal Medicine | Admitting: Internal Medicine

## 2023-10-13 DIAGNOSIS — M79661 Pain in right lower leg: Secondary | ICD-10-CM | POA: Diagnosis present

## 2023-10-13 DIAGNOSIS — M7989 Other specified soft tissue disorders: Secondary | ICD-10-CM | POA: Diagnosis present

## 2024-06-10 ENCOUNTER — Other Ambulatory Visit: Payer: Self-pay | Admitting: Primary Care

## 2024-06-10 DIAGNOSIS — Z1231 Encounter for screening mammogram for malignant neoplasm of breast: Secondary | ICD-10-CM

## 2024-07-05 ENCOUNTER — Encounter
